# Patient Record
Sex: Female | Born: 1950
Health system: Southern US, Community
[De-identification: ages and names within clinical notes are randomized; demographics above are authoritative.]

## PROBLEM LIST (undated history)

## (undated) DIAGNOSIS — E559 Vitamin D deficiency, unspecified: Secondary | ICD-10-CM

## (undated) DIAGNOSIS — E785 Hyperlipidemia, unspecified: Secondary | ICD-10-CM

## (undated) DIAGNOSIS — K219 Gastro-esophageal reflux disease without esophagitis: Secondary | ICD-10-CM

## (undated) DIAGNOSIS — M1990 Unspecified osteoarthritis, unspecified site: Secondary | ICD-10-CM

## (undated) DIAGNOSIS — H409 Unspecified glaucoma: Secondary | ICD-10-CM

## (undated) DIAGNOSIS — I10 Essential (primary) hypertension: Secondary | ICD-10-CM

## (undated) HISTORY — DX: Hyperlipidemia, unspecified: E78.5

## (undated) HISTORY — DX: Vitamin D deficiency, unspecified: E55.9

## (undated) HISTORY — DX: Unspecified osteoarthritis, unspecified site: M19.90

## (undated) HISTORY — PX: OTHER SURGICAL HISTORY: SHX169

## (undated) HISTORY — DX: Gastro-esophageal reflux disease without esophagitis: K21.9

---

## 2013-01-26 HISTORY — PX: COLONOSCOPY: SHX174

## 2016-05-09 ENCOUNTER — Telehealth: Payer: Self-pay | Admitting: General Practice

## 2016-05-09 MED ORDER — PREDNISONE 10 MG PO TABS: ORAL_TABLET | ORAL | 0 refills | Status: DC

## 2016-05-09 MED ORDER — PREDNISONE 10 MG (21) PO TBPK: 10.0000 mg | ORAL_TABLET | Freq: Every day | ORAL | 0 refills | Status: DC

## 2016-05-09 NOTE — Telephone Encounter (Signed)
Patient aware and she would like to do the prednisone. What mg dose pack would you like me to send in?

## 2016-05-09 NOTE — Telephone Encounter (Signed)
Stop the gabapentin, can send in a prednisone dose pack for treatment of the inflammation. She can take OTC Zyrtec for the allergic portion.

## 2016-05-09 NOTE — Telephone Encounter (Signed)
rx sent to pharmacy

## 2016-05-09 NOTE — Telephone Encounter (Signed)
Per pt, after taking Gabapentin she has had a itchy rash bilateral axilla and between thighs x 2 wks.  She has tried OTC creams but, rash continues.  Please advise

## 2016-09-18 ENCOUNTER — Other Ambulatory Visit: Payer: Self-pay | Admitting: Physician Assistant

## 2016-09-22 ENCOUNTER — Ambulatory Visit (INDEPENDENT_AMBULATORY_CARE_PROVIDER_SITE_OTHER): Payer: Medicaid Other | Admitting: Physician Assistant

## 2016-09-22 ENCOUNTER — Encounter: Payer: Self-pay | Admitting: Physician Assistant

## 2016-09-22 VITALS — BP 143/73 | HR 75 | Temp 98.0°F | Ht 62.0 in | Wt 169.0 lb

## 2016-09-22 DIAGNOSIS — M05741 Rheumatoid arthritis with rheumatoid factor of right hand without organ or systems involvement: Secondary | ICD-10-CM | POA: Diagnosis not present

## 2016-09-22 DIAGNOSIS — M05742 Rheumatoid arthritis with rheumatoid factor of left hand without organ or systems involvement: Secondary | ICD-10-CM | POA: Diagnosis not present

## 2016-09-22 DIAGNOSIS — K219 Gastro-esophageal reflux disease without esophagitis: Secondary | ICD-10-CM

## 2016-09-22 DIAGNOSIS — E559 Vitamin D deficiency, unspecified: Secondary | ICD-10-CM

## 2016-09-22 DIAGNOSIS — E7800 Pure hypercholesterolemia, unspecified: Secondary | ICD-10-CM | POA: Diagnosis not present

## 2016-09-22 MED ORDER — TRAMADOL HCL 50 MG PO TABS: 50.0000 mg | ORAL_TABLET | Freq: Four times a day (QID) | ORAL | 5 refills | Status: DC | PRN

## 2016-09-22 MED ORDER — DICLOFENAC SODIUM 1 % TD GEL: 4.0000 g | Freq: Four times a day (QID) | TRANSDERMAL | 11 refills | Status: DC

## 2016-09-22 NOTE — Patient Instructions (Signed)
Arthritis Introduction Arthritis means joint pain. It can also mean joint disease. A joint is a place where bones come together. People who have arthritis may have:  Red joints.  Swollen joints.  Stiff joints.  Warm joints.  A fever.  A feeling of being sick. Follow these instructions at home: Pay attention to any changes in your symptoms. Take these actions to help with your pain and swelling. Medicines  Take over-the-counter and prescription medicines only as told by your doctor.  Do not take aspirin for pain if your doctor says that you may have gout. Activity  Rest your joint if your doctor tells you to.  Avoid activities that make the pain worse.  Exercise your joint regularly as told by your doctor. Try doing exercises like:  Swimming.  Water aerobics.  Biking.  Walking. Joint Care   If your joint is swollen, keep it raised (elevated) if told by your doctor.  If your joint feels stiff in the morning, try taking a warm shower.  If you have diabetes, do not apply heat without asking your doctor.  If told, apply heat to the joint:  Put a towel between the joint and the hot pack or heating pad.  Leave the heat on the area for 20-30 minutes.  If told, apply ice to the joint:  Put ice in a plastic bag.  Place a towel between your skin and the bag.  Leave the ice on for 20 minutes, 2-3 times per day.  Keep all follow-up visits as told by your doctor. Contact a doctor if:  The pain gets worse.  You have a fever. Get help right away if:  You have very bad pain in your joint.  You have swelling in your joint.  Your joint is red.  Many joints become painful and swollen.  You have very bad back pain.  Your leg is very weak.  You cannot control your pee (urine) or poop (stool). This information is not intended to replace advice given to you by your health care provider. Make sure you discuss any questions you have with your health care  provider. Document Released: 11/08/2009 Document Revised: 01/20/2016 Document Reviewed: 11/09/2014  2017 Elsevier  

## 2016-09-24 NOTE — Progress Notes (Signed)
BP (!) 143/73   Pulse 75   Temp 98 F (36.7 C) (Oral)   Ht 5\' 2"  (1.575 m)   Wt 169 lb (76.7 kg)   BMI 30.91 kg/m    Subjective:    Patient ID: , female    DOB: 01-May-1951, 66 y.o.   MRN: [id number]  Kristi Barnes is a 66 y.o. female presenting on 09/22/2016 for Medication Refill  This patient comes in for periodic recheck on medications and conditions. All medications are reviewed today. There are no reports of any problems with the medications. All of the medical conditions are reviewed and updated.  Lab work is reviewed and will be ordered as medically necessary. There are no new problems reported with today's visit.  Medication Refill  This is a chronic problem. The current episode started more than 1 year ago. Associated symptoms include arthralgias, joint swelling and myalgias. Pertinent negatives include no abdominal pain, chest pain, coughing, fatigue or fever. The symptoms are aggravated by walking and standing. She has tried acetaminophen, NSAIDs and rest for the symptoms.    Past Medical History:  Diagnosis Date  . Arthritis   . GERD (gastroesophageal reflux disease)   . Hyperlipidemia   . Vitamin D deficiency    Relevant past medical, surgical, family and social history reviewed and updated as indicated. Interim medical history since our last visit reviewed. Allergies and medications reviewed and updated.   Data reviewed from any sources in EPIC.  Review of Systems  Constitutional: Negative.  Negative for activity change, fatigue and fever.  HENT: Negative.   Eyes: Negative.   Respiratory: Negative.  Negative for cough.   Cardiovascular: Negative.  Negative for chest pain.  Gastrointestinal: Negative.  Negative for abdominal pain.  Endocrine: Negative.   Genitourinary: Negative.  Negative for dysuria.  Musculoskeletal: Positive for arthralgias, joint swelling and myalgias.  Skin: Negative.   Neurological: Negative.      Social History    Social History  . Marital status: Divorced    Spouse name: N/A  . Number of children: N/A  . Years of education: N/A   Occupational History  . Not on file.   Social History Main Topics  . Smoking status: Never Smoker  . Smokeless tobacco: Never Used  . Alcohol use No  . Drug use: No  . Sexual activity: Not on file   Other Topics Concern  . Not on file   Social History Narrative  . No narrative on file    History reviewed. No pertinent surgical history.  History reviewed. No pertinent family history.  Allergies as of 09/22/2016      Reactions   Asa [aspirin] Other (See Comments)   Upset stomach       Medication List       Accurate as of 09/22/16 11:59 PM. Always use your most recent med list.          diclofenac sodium 1 % Gel Commonly known as:  VOLTAREN Apply 4 g topically 4 (four) times daily.   Geritol Liqd Take by mouth.   omeprazole 20 MG capsule Commonly known as:  PRILOSEC Take 20 mg by mouth daily.   simvastatin 40 MG tablet Commonly known as:  ZOCOR Take 40 mg by mouth daily.   traMADol 50 MG tablet Commonly known as:  ULTRAM Take 1 tablet (50 mg total) by mouth every 6 (six) hours as needed. for pain   Vitamin D (Ergocalciferol) 50000 units Caps capsule Commonly known  as:  DRISDOL Take 50,000 Units by mouth every 7 (seven) days.          Objective:    BP (!) 143/73   Pulse 75   Temp 98 F (36.7 C) (Oral)   Ht 5\' 2"  (1.575 m)   Wt 169 lb (76.7 kg)   BMI 30.91 kg/m   Allergies  Allergen Reactions  . Asa [Aspirin] Other (See Comments)    Upset stomach     Wt Readings from Last 3 Encounters:  09/22/16 169 lb (76.7 kg)    Physical Exam  Constitutional: She is oriented to person, place, and time. She appears well-developed and well-nourished.  HENT:  Head: Normocephalic and atraumatic.  Eyes: Conjunctivae and EOM are normal. Pupils are equal, round, and reactive to light.  Cardiovascular: Normal rate, regular rhythm,  normal heart sounds and intact distal pulses.   Pulmonary/Chest: Effort normal and breath sounds normal.  Abdominal: Soft. Bowel sounds are normal.  Musculoskeletal: Normal range of motion. She exhibits edema and deformity.  Neurological: She is alert and oriented to person, place, and time. She has normal reflexes.  Skin: Skin is warm and dry. No rash noted.  Psychiatric: She has a normal mood and affect. Her behavior is normal. Judgment and thought content normal.  Nursing note and vitals reviewed.       Assessment & Plan:   1. Rheumatoid arthritis involving both hands with positive rheumatoid factor (HCC) - traMADol (ULTRAM) 50 MG tablet; Take 1 tablet (50 mg total) by mouth every 6 (six) hours as needed. for pain  Dispense: 120 tablet; Refill: 5 - diclofenac sodium (VOLTAREN) 1 % GEL; Apply 4 g topically 4 (four) times daily.  Dispense: 300 g; Refill: 11  2. Vitamin D deficiency - Vitamin D, Ergocalciferol, (DRISDOL) 50000 units CAPS capsule; Take 50,000 Units by mouth every 7 (seven) days.  3. Gastroesophageal reflux disease without esophagitis - omeprazole (PRILOSEC) 20 MG capsule; Take 20 mg by mouth daily.; Refill: 12  4. Pure hypercholesterolemia - simvastatin (ZOCOR) 40 MG tablet; Take 40 mg by mouth daily.; Refill: 12   Continue all other maintenance medications as listed above. Educational handout given for arthritis  Follow up plan: Return in about 6 months (around 03/22/2017).  03/24/2017 PA-C [facility] [address]  [place], [place] [phone]   09/24/2016, 10:02 PM

## 2016-10-04 ENCOUNTER — Telehealth: Payer: Self-pay | Admitting: Physician Assistant

## 2016-10-04 NOTE — Telephone Encounter (Signed)
Yes, she can try it, if it is ineffective, she can make an appointment to discuss.

## 2016-10-04 NOTE — Telephone Encounter (Signed)
Patient wants to know if estroven for hot flashes OTC

## 2016-10-04 NOTE — Telephone Encounter (Signed)
Patient aware.

## 2016-12-14 ENCOUNTER — Other Ambulatory Visit: Payer: Self-pay | Admitting: *Deleted

## 2016-12-14 DIAGNOSIS — E559 Vitamin D deficiency, unspecified: Secondary | ICD-10-CM

## 2016-12-14 MED ORDER — VITAMIN D (ERGOCALCIFEROL) 1.25 MG (50000 UNIT) PO CAPS: 50000.0000 [IU] | ORAL_CAPSULE | ORAL | 11 refills | Status: DC

## 2017-02-12 ENCOUNTER — Telehealth: Payer: Self-pay | Admitting: Physician Assistant

## 2017-02-12 ENCOUNTER — Other Ambulatory Visit: Payer: Self-pay | Admitting: Physician Assistant

## 2017-02-12 DIAGNOSIS — E7800 Pure hypercholesterolemia, unspecified: Secondary | ICD-10-CM

## 2017-02-12 NOTE — Telephone Encounter (Signed)
What is the name of the medication? simvastatin  Have you contacted your pharmacy to request a refill? yes  Which pharmacy would you like this sent to? [facility] in [place]   Patient notified that their request is being sent to the clinical staff for review and that they should receive a call once it is complete. If they do not receive a call within 24 hours they can check with their pharmacy or our office.

## 2017-02-12 NOTE — Telephone Encounter (Signed)
No labs in chart

## 2017-02-12 NOTE — Telephone Encounter (Signed)
Fill for 2 months and patient can return for labs in next two months. Annual labs: CMP, CBC, LIPID

## 2017-02-13 ENCOUNTER — Other Ambulatory Visit: Payer: Self-pay | Admitting: *Deleted

## 2017-02-13 DIAGNOSIS — K219 Gastro-esophageal reflux disease without esophagitis: Secondary | ICD-10-CM

## 2017-02-13 DIAGNOSIS — E786 Lipoprotein deficiency: Secondary | ICD-10-CM

## 2017-02-13 NOTE — Telephone Encounter (Signed)
Patient aware and lab orders placed

## 2017-02-20 ENCOUNTER — Other Ambulatory Visit: Payer: Medicaid Other

## 2017-02-20 DIAGNOSIS — E786 Lipoprotein deficiency: Secondary | ICD-10-CM

## 2017-02-20 DIAGNOSIS — K219 Gastro-esophageal reflux disease without esophagitis: Secondary | ICD-10-CM

## 2017-02-20 LAB — CMP14+EGFR
A/G RATIO: 1.7 (ref 1.2–2.2)
ALT: 14 IU/L (ref 0–32)
AST: 21 IU/L (ref 0–40)
Albumin: 4.6 g/dL (ref 3.6–4.8)
Alkaline Phosphatase: 78 IU/L (ref 39–117)
BILIRUBIN TOTAL: 0.2 mg/dL (ref 0.0–1.2)
BUN/Creatinine Ratio: 11 — ABNORMAL LOW (ref 12–28)
BUN: 11 mg/dL (ref 8–27)
CHLORIDE: 100 mmol/L (ref 96–106)
CO2: 26 mmol/L (ref 20–29)
Calcium: 9.9 mg/dL (ref 8.7–10.3)
Creatinine, Ser: 0.99 mg/dL (ref 0.57–1.00)
GFR calc non Af Amer: 60 mL/min/{1.73_m2} (ref >59)
GFR, EST AFRICAN AMERICAN: 70 mL/min/{1.73_m2} (ref >59)
Globulin, Total: 2.7 g/dL (ref 1.5–4.5)
Glucose: 76 mg/dL (ref 65–99)
POTASSIUM: 4.5 mmol/L (ref 3.5–5.2)
SODIUM: 139 mmol/L (ref 134–144)
Total Protein: 7.3 g/dL (ref 6.0–8.5)

## 2017-02-20 LAB — CBC WITH DIFFERENTIAL/PLATELET
BASOS: 0 %
Basophils Absolute: 0 10*3/uL (ref 0.0–0.2)
EOS (ABSOLUTE): 0 10*3/uL (ref 0.0–0.4)
Eos: 1 %
Hematocrit: 34.7 % (ref 34.0–46.6)
Hemoglobin: 11.9 g/dL (ref 11.1–15.9)
Immature Grans (Abs): 0 10*3/uL (ref 0.0–0.1)
Immature Granulocytes: 0 %
LYMPHS ABS: 1.5 10*3/uL (ref 0.7–3.1)
Lymphs: 42 %
MCH: 30 pg (ref 26.6–33.0)
MCHC: 34.3 g/dL (ref 31.5–35.7)
MCV: 87 fL (ref 79–97)
Monocytes Absolute: 0.3 10*3/uL (ref 0.1–0.9)
Monocytes: 8 %
NEUTROS PCT: 49 %
Neutrophils Absolute: 1.8 10*3/uL (ref 1.4–7.0)
PLATELETS: 268 10*3/uL (ref 150–379)
RBC: 3.97 x10E6/uL (ref 3.77–5.28)
RDW: 13.2 % (ref 12.3–15.4)
WBC: 3.7 10*3/uL (ref 3.4–10.8)

## 2017-02-20 LAB — LIPID PANEL
Chol/HDL Ratio: 3.4 ratio (ref 0.0–4.4)
Cholesterol, Total: 177 mg/dL (ref 100–199)
HDL: 52 mg/dL (ref >39)
LDL Calculated: 106 mg/dL — ABNORMAL HIGH (ref 0–99)
TRIGLYCERIDES: 95 mg/dL (ref 0–149)
VLDL Cholesterol Cal: 19 mg/dL (ref 5–40)

## 2017-03-23 ENCOUNTER — Ambulatory Visit (INDEPENDENT_AMBULATORY_CARE_PROVIDER_SITE_OTHER): Payer: Medicaid Other | Admitting: Physician Assistant

## 2017-03-23 ENCOUNTER — Encounter: Payer: Self-pay | Admitting: Physician Assistant

## 2017-03-23 DIAGNOSIS — M05741 Rheumatoid arthritis with rheumatoid factor of right hand without organ or systems involvement: Secondary | ICD-10-CM | POA: Diagnosis not present

## 2017-03-23 DIAGNOSIS — M05742 Rheumatoid arthritis with rheumatoid factor of left hand without organ or systems involvement: Secondary | ICD-10-CM

## 2017-03-23 DIAGNOSIS — K219 Gastro-esophageal reflux disease without esophagitis: Secondary | ICD-10-CM

## 2017-03-23 DIAGNOSIS — E7800 Pure hypercholesterolemia, unspecified: Secondary | ICD-10-CM

## 2017-03-23 MED ORDER — OMEPRAZOLE 20 MG PO CPDR: 20.0000 mg | DELAYED_RELEASE_CAPSULE | Freq: Every day | ORAL | 12 refills | Status: DC

## 2017-03-23 MED ORDER — SIMVASTATIN 40 MG PO TABS: 40.0000 mg | ORAL_TABLET | Freq: Every day | ORAL | 12 refills | Status: DC

## 2017-03-23 MED ORDER — TRAMADOL HCL 50 MG PO TABS: 50.0000 mg | ORAL_TABLET | Freq: Four times a day (QID) | ORAL | 5 refills | Status: DC | PRN

## 2017-03-23 NOTE — Patient Instructions (Signed)
In a few days you may receive a survey in the mail or online from [employer] regarding your visit with us today. Please take a moment to fill this out. Your feedback is very important to our whole office. It can help us better understand your needs as well as improve your experience and satisfaction. Thank you for taking your time to complete it. We care about you.  Ezekiel Menzer, PA-C  

## 2017-03-23 NOTE — Progress Notes (Signed)
BP (!) 144/80   Pulse 74   Temp 98.3 F (36.8 C) (Oral)   Ht _0  (1.575 m)   Wt 168 lb (76.2 kg)   BMI 30.73 kg/m    Subjective:    Patient ID: Kristi Barnes, female    DOB: Apr 02, 1951, 66 y.o.   MRN: [id number]  HPI: Kristi Barnes is a 66 y.o. female presenting on 03/23/2017 for Follow-up (6 mo); Hyperlipidemia; and Rheumatoid Arthritis  This patient comes in for periodic recheck on medications and conditions including GERD, rheumatoid arthritis, hyperlipidemia. Patient is quite sad today. She has had thoughts of death and her very large family. She had death of a niece last night. She is visibly upset today. She is having months of ongoing pain in her hands. Her morning stiffness is still quite severe at times. She is doing well with the medications that she is currently on. She does not want to take an antidepressant at this time. Depression screen [facility] 2/9 03/23/2017 09/22/2016  Decreased Interest 2 2  Down, Depressed, Hopeless 2 1  PHQ - 2 Score 4 3  Altered sleeping 1 3  Tired, decreased energy 2 2  Change in appetite 1 3  Feeling bad or failure about yourself  1 3  Trouble concentrating 2 2  Moving slowly or fidgety/restless 2 3  Suicidal thoughts 1 3  PHQ-9 Score 14 22   .   All medications are reviewed today. There are no reports of any problems with the medications. All of the medical conditions are reviewed and updated.  Lab work is reviewed and will be ordered as medically necessary. There are no new problems reported with today's visit.   Relevant past medical, surgical, family and social history reviewed and updated as indicated. Allergies and medications reviewed and updated.  Past Medical History:  Diagnosis Date  . Arthritis   . GERD (gastroesophageal reflux disease)   . Hyperlipidemia   . Vitamin D deficiency     History reviewed. No pertinent surgical history.  Review of Systems  Constitutional: Negative.  Negative for activity change, fatigue and  fever.  HENT: Negative.   Eyes: Negative.   Respiratory: Negative.  Negative for cough.   Cardiovascular: Negative.  Negative for chest pain.  Gastrointestinal: Negative.  Negative for abdominal pain.  Endocrine: Negative.   Genitourinary: Negative.  Negative for dysuria.  Musculoskeletal: Positive for arthralgias, back pain, gait problem, joint swelling and myalgias.  Skin: Negative.   Psychiatric/Behavioral: Positive for decreased concentration and dysphoric mood. Negative for self-injury, sleep disturbance and suicidal ideas. The patient is nervous/anxious.     Allergies as of 03/23/2017      Reactions   Asa [aspirin] Other (See Comments)   Upset stomach       Medication List       Accurate as of 03/23/17 12:41 PM. Always use your most recent med list.          diclofenac sodium 1 % Gel Commonly known as:  VOLTAREN Apply 4 g topically 4 (four) times daily.   Geritol Liqd Take by mouth.   omeprazole 20 MG capsule Commonly known as:  PRILOSEC Take 1 capsule (20 mg total) by mouth daily.   simvastatin 40 MG tablet Commonly known as:  ZOCOR Take 1 tablet (40 mg total) by mouth daily.   traMADol 50 MG tablet Commonly known as:  ULTRAM Take 1 tablet (50 mg total) by mouth every 6 (six) hours as needed. for pain  Vitamin D (Ergocalciferol) 50000 units Caps capsule Commonly known as:  DRISDOL Take 1 capsule (50,000 Units total) by mouth every 7 (seven) days.   vitamin E 400 UNIT capsule Generic drug:  vitamin E 400 Units.          Objective:    BP (!) 144/80   Pulse 74   Temp 98.3 F (36.8 C) (Oral)   Ht _0  (1.575 m)   Wt 168 lb (76.2 kg)   BMI 30.73 kg/m   Allergies  Allergen Reactions  . Asa [Aspirin] Other (See Comments)    Upset stomach      Physical Exam  Constitutional: She is oriented to person, place, and time. She appears well-developed and well-nourished.  HENT:  Head: Normocephalic and atraumatic.  Eyes: Pupils are equal, round,  and reactive to light. Conjunctivae and EOM are normal.  Cardiovascular: Normal rate, regular rhythm, normal heart sounds and intact distal pulses.   Pulmonary/Chest: Effort normal and breath sounds normal.  Abdominal: Soft. Bowel sounds are normal.  Musculoskeletal:       Right knee: She exhibits decreased range of motion, swelling and deformity.       Left knee: She exhibits decreased range of motion, swelling and deformity.  Significantly swollen tender nodules on the hands fingers and elbows.  Neurological: She is alert and oriented to person, place, and time. She has normal reflexes.  Skin: Skin is warm and dry. No rash noted.  Psychiatric: She has a normal mood and affect. Her behavior is normal. Judgment and thought content normal.    Results for orders placed or performed in visit on 02/20/17  CBC with Differential/Platelet  Result Value Ref Range   WBC 3.7 3.4 - 10.8 x10E3/uL   RBC 3.97 3.77 - 5.28 x10E6/uL   Hemoglobin 11.9 11.1 - 15.9 g/dL   Hematocrit 34.7 34.0 - 46.6 %   MCV 87 79 - 97 fL   MCH 30.0 26.6 - 33.0 pg   MCHC 34.3 31.5 - 35.7 g/dL   RDW 13.2 12.3 - 15.4 %   Platelets 268 150 - 379 x10E3/uL   Neutrophils 49 Not Estab. %   Lymphs 42 Not Estab. %   Monocytes 8 Not Estab. %   Eos 1 Not Estab. %   Basos 0 Not Estab. %   Neutrophils Absolute 1.8 1.4 - 7.0 x10E3/uL   Lymphocytes Absolute 1.5 0.7 - 3.1 x10E3/uL   Monocytes Absolute 0.3 0.1 - 0.9 x10E3/uL   EOS (ABSOLUTE) 0.0 0.0 - 0.4 x10E3/uL   Basophils Absolute 0.0 0.0 - 0.2 x10E3/uL   Immature Granulocytes 0 Not Estab. %   Immature Grans (Abs) 0.0 0.0 - 0.1 x10E3/uL  CMP14+EGFR  Result Value Ref Range   Glucose 76 65 - 99 mg/dL   BUN 11 8 - 27 mg/dL   Creatinine, Ser 0.99 0.57 - 1.00 mg/dL   GFR calc non Af Amer 60 >59 mL/min/1.73   GFR calc Af Amer 70 >59 mL/min/1.73   BUN/Creatinine Ratio 11 (L) 12 - 28   Sodium 139 134 - 144 mmol/L   Potassium 4.5 3.5 - 5.2 mmol/L   Chloride 100 96 - 106 mmol/L     CO2 26 20 - 29 mmol/L   Calcium 9.9 8.7 - 10.3 mg/dL   Total Protein 7.3 6.0 - 8.5 g/dL   Albumin 4.6 3.6 - 4.8 g/dL   Globulin, Total 2.7 1.5 - 4.5 g/dL   Albumin/Globulin Ratio 1.7 1.2 - 2.2   Bilirubin Total  0.2 0.0 - 1.2 mg/dL   Alkaline Phosphatase 78 39 - 117 IU/L   AST 21 0 - 40 IU/L   ALT 14 0 - 32 IU/L  Lipid panel  Result Value Ref Range   Cholesterol, Total 177 100 - 199 mg/dL   Triglycerides 95 0 - 149 mg/dL   HDL 52 >39 mg/dL   VLDL Cholesterol Cal 19 5 - 40 mg/dL   LDL Calculated 106 (H) 0 - 99 mg/dL   Chol/HDL Ratio 3.4 0.0 - 4.4 ratio      Assessment & Plan:   1. Gastroesophageal reflux disease without esophagitis - omeprazole (PRILOSEC) 20 MG capsule; Take 1 capsule (20 mg total) by mouth daily.  Dispense: 30 capsule; Refill: 12  2. Rheumatoid arthritis involving both hands with positive rheumatoid factor (HCC) - traMADol (ULTRAM) 50 MG tablet; Take 1 tablet (50 mg total) by mouth every 6 (six) hours as needed. for pain  Dispense: 120 tablet; Refill: 5  3. Pure hypercholesterolemia - simvastatin (ZOCOR) 40 MG tablet; Take 1 tablet (40 mg total) by mouth daily.  Dispense: 30 tablet; Refill: 12   Current Outpatient Prescriptions:  .  diclofenac sodium (VOLTAREN) 1 % GEL, Apply 4 g topically 4 (four) times daily., Disp: 300 g, Rfl: 11 .  Iron-Vitamins (GERITOL) LIQD, Take by mouth., Disp: , Rfl:  .  omeprazole (PRILOSEC) 20 MG capsule, Take 1 capsule (20 mg total) by mouth daily., Disp: 30 capsule, Rfl: 12 .  simvastatin (ZOCOR) 40 MG tablet, Take 1 tablet (40 mg total) by mouth daily., Disp: 30 tablet, Rfl: 12 .  traMADol (ULTRAM) 50 MG tablet, Take 1 tablet (50 mg total) by mouth every 6 (six) hours as needed. for pain, Disp: 120 tablet, Rfl: 5 .  Vitamin D, Ergocalciferol, (DRISDOL) 50000 units CAPS capsule, Take 1 capsule (50,000 Units total) by mouth every 7 (seven) days., Disp: 4 capsule, Rfl: 11 .  vitamin E (VITAMIN E) 400 UNIT capsule, 400 Units.,  Disp: , Rfl:   Continue all other maintenance medications as listed above.  Follow up plan: Return in about 6 months (around 09/23/2017) for recheck.  Educational handout given for [place] PA-C [place] [address]  [place], [place] [address] [phone]   03/23/2017, 12:41 PM

## 2017-07-30 ENCOUNTER — Encounter: Payer: Self-pay | Admitting: Physician Assistant

## 2017-07-30 ENCOUNTER — Ambulatory Visit: Payer: Medicaid Other | Admitting: Physician Assistant

## 2017-07-30 VITALS — BP 158/73 | HR 75 | Temp 98.3°F | Ht 62.0 in | Wt 171.2 lb

## 2017-07-30 DIAGNOSIS — S6992XD Unspecified injury of left wrist, hand and finger(s), subsequent encounter: Secondary | ICD-10-CM | POA: Diagnosis not present

## 2017-07-30 DIAGNOSIS — M778 Other enthesopathies, not elsewhere classified: Secondary | ICD-10-CM

## 2017-07-30 DIAGNOSIS — M779 Enthesopathy, unspecified: Principal | ICD-10-CM

## 2017-07-30 NOTE — Patient Instructions (Signed)

## 2017-07-30 NOTE — Progress Notes (Signed)
BP (!) 158/73   Pulse 75   Temp 98.3 F (36.8 C) (Oral)   Ht 5\' 2"  (1.575 m)   Wt 171 lb 3.2 oz (77.7 kg)   BMI 31.31 kg/m    Subjective:    Patient ID: , female    DOB: 1951-02-01, 66 y.o.   MRN: [id number]  HPI: Kristi Barnes is a 66 y.o. female presenting on 07/30/2017 for ER follow up [facility])    Relevant past medical, surgical, family and social history reviewed and updated as indicated. Allergies and medications reviewed and updated.  Past Medical History:  Diagnosis Date  . Arthritis   . GERD (gastroesophageal reflux disease)   . Hyperlipidemia   . Vitamin D deficiency     History reviewed. No pertinent surgical history.  Review of Systems  Constitutional: Negative.  Negative for activity change, fatigue and fever.  HENT: Negative.   Eyes: Negative.   Respiratory: Negative.  Negative for cough.   Cardiovascular: Negative.  Negative for chest pain.  Gastrointestinal: Negative.  Negative for abdominal pain.  Endocrine: Negative.   Genitourinary: Negative.  Negative for dysuria.  Musculoskeletal: Positive for arthralgias, joint swelling and myalgias.  Skin: Negative.   Neurological: Negative.     Allergies as of 07/30/2017      Reactions   Asa [aspirin] Other (See Comments)   Upset stomach       Medication List        Accurate as of 07/30/17  2:58 PM. Always use your most recent med list.          diclofenac sodium 1 % Gel Commonly known as:  VOLTAREN Apply 4 g topically 4 (four) times daily.   Geritol Liqd Take by mouth.   omeprazole 20 MG capsule Commonly known as:  PRILOSEC Take 1 capsule (20 mg total) by mouth daily.   predniSONE 20 MG tablet Commonly known as:  DELTASONE TAKE TWO (2) TABLETS BY MOUTH DAILY.   simvastatin 40 MG tablet Commonly known as:  ZOCOR Take 1 tablet (40 mg total) by mouth daily.   traMADol 50 MG tablet Commonly known as:  ULTRAM Take 1 tablet (50 mg total) by mouth every 6 (six) hours  as needed. for pain   Vitamin D (Ergocalciferol) 50000 units Caps capsule Commonly known as:  DRISDOL Take 1 capsule (50,000 Units total) by mouth every 7 (seven) days.   vitamin E 400 UNIT capsule Generic drug:  vitamin E 400 Units.          Objective:    BP (!) 158/73   Pulse 75   Temp 98.3 F (36.8 C) (Oral)   Ht 5\' 2"  (1.575 m)   Wt 171 lb 3.2 oz (77.7 kg)   BMI 31.31 kg/m   Allergies  Allergen Reactions  . Asa [Aspirin] Other (See Comments)    Upset stomach      Physical Exam  Constitutional: She is oriented to person, place, and time. She appears well-developed and well-nourished.  HENT:  Head: Normocephalic and atraumatic.  Eyes: Conjunctivae and EOM are normal. Pupils are equal, round, and reactive to light.  Cardiovascular: Normal rate, regular rhythm, normal heart sounds and intact distal pulses.  Pulmonary/Chest: Effort normal and breath sounds normal.  Abdominal: Soft. Bowel sounds are normal.  Musculoskeletal:       Left hand: She exhibits decreased range of motion, tenderness and deformity. She exhibits normal capillary refill. Normal sensation noted. Normal strength noted.  Hands: Limited ROM continue to wear splint  Neurological: She is alert and oriented to person, place, and time. She has normal reflexes.  Skin: Skin is warm and dry. No rash noted.  Psychiatric: She has a normal mood and affect. Her behavior is normal. Judgment and thought content normal.        Assessment & Plan:   1. Thumb tendonitis - predniSONE (DELTASONE) 20 MG tablet; TAKE TWO (2) TABLETS BY MOUTH DAILY.; Refill: 0 - Ambulatory referral to Hand Surgery  2. Thumb injury, left, subsequent encounter - predniSONE (DELTASONE) 20 MG tablet; TAKE TWO (2) TABLETS BY MOUTH DAILY.; Refill: 0 - Ambulatory referral to Hand Surgery    Current Outpatient Medications:  .  diclofenac sodium (VOLTAREN) 1 % GEL, Apply 4 g topically 4 (four) times daily., Disp: 300 g, Rfl:  11 .  Iron-Vitamins (GERITOL) LIQD, Take by mouth., Disp: , Rfl:  .  omeprazole (PRILOSEC) 20 MG capsule, Take 1 capsule (20 mg total) by mouth daily., Disp: 30 capsule, Rfl: 12 .  predniSONE (DELTASONE) 20 MG tablet, TAKE TWO (2) TABLETS BY MOUTH DAILY., Disp: , Rfl: 0 .  simvastatin (ZOCOR) 40 MG tablet, Take 1 tablet (40 mg total) by mouth daily., Disp: 30 tablet, Rfl: 12 .  traMADol (ULTRAM) 50 MG tablet, Take 1 tablet (50 mg total) by mouth every 6 (six) hours as needed. for pain, Disp: 120 tablet, Rfl: 5 .  Vitamin D, Ergocalciferol, (DRISDOL) 50000 units CAPS capsule, Take 1 capsule (50,000 Units total) by mouth every 7 (seven) days., Disp: 4 capsule, Rfl: 11 .  vitamin E (VITAMIN E) 400 UNIT capsule, 400 Units., Disp: , Rfl:  Continue all other maintenance medications as listed above.  Follow up plan: Return if symptoms worsen or fail to improve.  Educational handout given for survey  Remus Loffler PA-C [facility] [address]  [place], [place] [address] [phone]   07/30/2017, 2:58 PM

## 2017-08-16 ENCOUNTER — Ambulatory Visit (INDEPENDENT_AMBULATORY_CARE_PROVIDER_SITE_OTHER): Payer: Medicaid Other | Admitting: Orthopaedic Surgery

## 2017-08-16 ENCOUNTER — Encounter (INDEPENDENT_AMBULATORY_CARE_PROVIDER_SITE_OTHER): Payer: Self-pay | Admitting: Orthopaedic Surgery

## 2017-08-16 VITALS — BP 149/89 | HR 71 | Ht 62.0 in | Wt 162.0 lb

## 2017-08-16 DIAGNOSIS — M65312 Trigger thumb, left thumb: Secondary | ICD-10-CM

## 2017-08-16 MED ORDER — BUPIVACAINE HCL 0.25 % IJ SOLN
0.3300 mL | INTRAMUSCULAR | Status: AC | PRN
  Administered 2017-08-16: .33 mL

## 2017-08-16 MED ORDER — METHYLPREDNISOLONE ACETATE 40 MG/ML IJ SUSP
13.3300 mg | INTRAMUSCULAR | Status: AC | PRN
  Administered 2017-08-16: 13.33 mg

## 2017-08-16 MED ORDER — LIDOCAINE HCL 1 % IJ SOLN
0.3000 mL | INTRAMUSCULAR | Status: AC | PRN
  Administered 2017-08-16: .3 mL

## 2017-08-16 NOTE — Progress Notes (Signed)
Office Visit Note   Patient: Kristi Barnes           Date of Birth: [birth year]           MRN: [id number] Visit Date: 08/16/2017              Requested by: Remus Loffler, PA-C [address] [place], [place] [address] PCP: Remus Loffler, PA-C   Assessment & Plan: Visit Diagnoses:  1. Trigger thumb, left thumb     Plan: Left trigger thumb injection performed she tolerated the injection well.  She has persistent triggering she will call and let us know. Follow-Up Instructions: No Follow-up on file.   Orders:  No orders of the defined types were placed in this encounter.  No orders of the defined types were placed in this encounter.     Procedures: Hand/UE Inj: L thumb A1 for trigger finger on 08/16/2017 3:11 PM Details: 25 G needle Medications: 0.3 mL lidocaine 1 %; 0.33 mL bupivacaine 0.25 %; 13.33 mg methylPREDNISolone acetate 40 MG/ML      Clinical Data: No additional findings.   Subjective: Chief Complaint  Patient presents with  . Left Thumb - Pain    HPI 66 year old female was helping her sister move to another location 4 months ago and has had pain in her thumb with 2 episodes of trigger thumb the rest the time she is kept that extended and has been wearing a splint.  She is given some prednisone with some improvement she is used tramadol and Voltaren gel without resolution.  She has a diagnosis with rheumatoid factor positive rheumatoid arthritis.  Review of Systems positive for seropositive rheumatoid arthritis, GE reflux, high cholesterol.  Left trigger thumb.  Positive for cataracts otherwise negative as it pertains HPI.   Objective: Vital Signs: BP (!) 149/89   Pulse 71   Ht 5\' 2"  (1.575 m)   Wt 162 lb (73.5 kg)   BMI 29.63 kg/m   Physical Exam  Constitutional: She is oriented to person, place, and time. She appears well-developed.  HENT:  Head: Normocephalic.  Right Ear: External ear normal.  Left Ear: External ear normal.  Eyes: Pupils are  equal, round, and reactive to light.  Neck: No tracheal deviation present. No thyromegaly present.  Cardiovascular: Normal rate.  Pulmonary/Chest: Effort normal.  Abdominal: Soft.  Neurological: She is alert and oriented to person, place, and time.  Skin: Skin is warm and dry.  Psychiatric: She has a normal mood and affect. Her behavior is normal.    Ortho Exam patient present with palpation over the left thumb A1 pulley with palpable nodule.  Unable to actively flex.  Mild seen tenderness.  Dorsal compartments otherwise are normal.  Good cervical range of motion elbow reaches full extension opposite hand shows no tenderness over the A1 pulley.  Specialty Comments:  No specialty comments available.  Imaging: No results found.   PMFS History: Patient Active Problem List   Diagnosis Date Noted  . Thumb tendonitis 07/30/2017  . Gastroesophageal reflux disease without esophagitis 03/23/2017  . Rheumatoid arthritis involving both hands with positive rheumatoid factor (HCC) 03/23/2017  . Pure hypercholesterolemia 03/23/2017   Past Medical History:  Diagnosis Date  . Arthritis   . GERD (gastroesophageal reflux disease)   . Hyperlipidemia   . Vitamin D deficiency     No family history on file.  No past surgical history on file. Social History   Occupational History  . Not on file  Tobacco Use  . Smoking status: Never Smoker  . Smokeless tobacco: Never Used  Substance and Sexual Activity  . Alcohol use: No  . Drug use: No  . Sexual activity: Not on file

## 2017-08-23 ENCOUNTER — Telehealth: Payer: Self-pay | Admitting: Physician Assistant

## 2017-09-21 ENCOUNTER — Encounter: Payer: Self-pay | Admitting: Physician Assistant

## 2017-09-21 ENCOUNTER — Ambulatory Visit: Payer: Medicaid Other | Admitting: Physician Assistant

## 2017-09-21 VITALS — BP 133/73 | HR 85 | Temp 98.1°F | Ht 62.0 in | Wt 169.8 lb

## 2017-09-21 DIAGNOSIS — M05742 Rheumatoid arthritis with rheumatoid factor of left hand without organ or systems involvement: Secondary | ICD-10-CM

## 2017-09-21 DIAGNOSIS — M05741 Rheumatoid arthritis with rheumatoid factor of right hand without organ or systems involvement: Secondary | ICD-10-CM

## 2017-09-21 DIAGNOSIS — Z9181 History of falling: Secondary | ICD-10-CM | POA: Diagnosis not present

## 2017-09-21 DIAGNOSIS — K047 Periapical abscess without sinus: Secondary | ICD-10-CM | POA: Diagnosis not present

## 2017-09-21 MED ORDER — PENICILLIN V POTASSIUM 500 MG PO TABS: 500.0000 mg | ORAL_TABLET | Freq: Four times a day (QID) | ORAL | 1 refills | Status: DC

## 2017-09-21 MED ORDER — TRAMADOL HCL 50 MG PO TABS: 50.0000 mg | ORAL_TABLET | Freq: Four times a day (QID) | ORAL | 5 refills | Status: DC | PRN

## 2017-09-21 NOTE — Patient Instructions (Signed)
In a few days you may receive a survey in the mail or online from [employer] regarding your visit with us today. Please take a moment to fill this out. Your feedback is very important to our whole office. It can help us better understand your needs as well as improve your experience and satisfaction. Thank you for taking your time to complete it. We care about you.  Jaymere Alen, PA-C  

## 2017-09-21 NOTE — Progress Notes (Signed)
Falls    BP 133/73   Pulse 85   Temp 98.1 F (36.7 C) (Oral)   Ht 5\' 2"  (1.575 m)   Wt 169 lb 12.8 oz (77 kg)   BMI 31.06 kg/m    Subjective:    Patient ID: , female    DOB: 1951-05-30, 67 y.o.   MRN: [id number]  HPI: Kristi Barnes is a 67 y.o. female presenting on 09/21/2017 for Follow-up (6 month )  Patient comes in for an abscessed tooth.  She cannot get her dentist for another she has never had a problem with this before.  It is very painful in the upper rim of her teeth.  It hurts to press down on it. Patient also reports that she has had a few more falls.  She does have known rheumatoid arthritis.  Denies any major injury.  But we wanted to just take note that she has had a fall .  Relevant past medical, surgical, family and social history reviewed and updated as indicated. Allergies and medications reviewed and updated.  Past Medical History:  Diagnosis Date  . Arthritis   . GERD (gastroesophageal reflux disease)   . Hyperlipidemia   . Vitamin D deficiency     History reviewed. No pertinent surgical history.  Review of Systems  Constitutional: Negative.   HENT: Positive for dental problem and mouth sores.   Eyes: Negative.   Respiratory: Negative.   Gastrointestinal: Negative.   Genitourinary: Negative.   Musculoskeletal: Positive for arthralgias, joint swelling and myalgias.    Allergies as of 09/21/2017      Reactions   Asa [aspirin] Other (See Comments)   Upset stomach       Medication List        Accurate as of 09/21/17 11:59 PM. Always use your most recent med list.          diclofenac sodium 1 % Gel Commonly known as:  VOLTAREN Apply 4 g topically 4 (four) times daily.   Geritol Liqd Take by mouth.   omeprazole 20 MG capsule Commonly known as:  PRILOSEC Take 1 capsule (20 mg total) by mouth daily.   penicillin v potassium 500 MG tablet Commonly known as:  VEETID Take 1 tablet (500 mg total) by mouth 4 (four) times daily.     simvastatin 40 MG tablet Commonly known as:  ZOCOR Take 1 tablet (40 mg total) by mouth daily.   traMADol 50 MG tablet Commonly known as:  ULTRAM Take 1 tablet (50 mg total) by mouth every 6 (six) hours as needed. for pain   Vitamin D (Ergocalciferol) 50000 units Caps capsule Commonly known as:  DRISDOL Take 1 capsule (50,000 Units total) by mouth every 7 (seven) days.   vitamin E 400 UNIT capsule Generic drug:  vitamin E 400 Units.          Objective:    BP 133/73   Pulse 85   Temp 98.1 F (36.7 C) (Oral)   Ht 5\' 2"  (1.575 m)   Wt 169 lb 12.8 oz (77 kg)   BMI 31.06 kg/m   Allergies  Allergen Reactions  . Asa [Aspirin] Other (See Comments)    Upset stomach      Physical Exam  Constitutional: She is oriented to person, place, and time. She appears well-developed and well-nourished.  HENT:  Head: Normocephalic and atraumatic.  Mouth/Throat: Abnormal dentition. Dental abscesses present.    Eyes: Conjunctivae and EOM are normal. Pupils are equal, round, and  reactive to light.  Cardiovascular: Normal rate, regular rhythm, normal heart sounds and intact distal pulses.  Pulmonary/Chest: Effort normal and breath sounds normal.  Abdominal: Soft. Bowel sounds are normal.  Neurological: She is alert and oriented to person, place, and time. She has normal reflexes.  Skin: Skin is warm and dry. No rash noted.  Psychiatric: She has a normal mood and affect. Her behavior is normal. Judgment and thought content normal.        Assessment & Plan:   1. Rheumatoid arthritis involving both hands with positive rheumatoid factor (HCC) - traMADol (ULTRAM) 50 MG tablet; Take 1 tablet (50 mg total) by mouth every 6 (six) hours as needed. for pain  Dispense: 120 tablet; Refill: 5  2. Dental abscess - penicillin v potassium (VEETID) 500 MG tablet; Take 1 tablet (500 mg total) by mouth 4 (four) times daily.  Dispense: 40 tablet; Refill: 1  3. History of falling    Current  Outpatient Medications:  .  diclofenac sodium (VOLTAREN) 1 % GEL, Apply 4 g topically 4 (four) times daily., Disp: 300 g, Rfl: 11 .  Iron-Vitamins (GERITOL) LIQD, Take by mouth., Disp: , Rfl:  .  omeprazole (PRILOSEC) 20 MG capsule, Take 1 capsule (20 mg total) by mouth daily., Disp: 30 capsule, Rfl: 12 .  penicillin v potassium (VEETID) 500 MG tablet, Take 1 tablet (500 mg total) by mouth 4 (four) times daily., Disp: 40 tablet, Rfl: 1 .  simvastatin (ZOCOR) 40 MG tablet, Take 1 tablet (40 mg total) by mouth daily., Disp: 30 tablet, Rfl: 12 .  traMADol (ULTRAM) 50 MG tablet, Take 1 tablet (50 mg total) by mouth every 6 (six) hours as needed. for pain, Disp: 120 tablet, Rfl: 5 .  Vitamin D, Ergocalciferol, (DRISDOL) 50000 units CAPS capsule, Take 1 capsule (50,000 Units total) by mouth every 7 (seven) days., Disp: 4 capsule, Rfl: 11 .  vitamin E (VITAMIN E) 400 UNIT capsule, 400 Units., Disp: , Rfl:  Continue all other maintenance medications as listed above.  Follow up plan: Return in about 6 months (around 03/21/2018) for recheck.  Educational handout given for survey  Remus Loffler PA-C [facility] [address]  [place], [place] [address] [phone]   09/24/2017, 9:21 AM

## 2017-12-05 ENCOUNTER — Other Ambulatory Visit: Payer: Self-pay | Admitting: Physician Assistant

## 2017-12-05 DIAGNOSIS — E559 Vitamin D deficiency, unspecified: Secondary | ICD-10-CM

## 2017-12-10 ENCOUNTER — Encounter: Payer: Self-pay | Admitting: *Deleted

## 2018-01-07 DIAGNOSIS — Z1231 Encounter for screening mammogram for malignant neoplasm of breast: Secondary | ICD-10-CM | POA: Diagnosis not present

## 2018-02-19 ENCOUNTER — Encounter: Payer: Self-pay | Admitting: *Deleted

## 2018-03-21 ENCOUNTER — Encounter: Payer: Self-pay | Admitting: Physician Assistant

## 2018-03-21 ENCOUNTER — Ambulatory Visit (INDEPENDENT_AMBULATORY_CARE_PROVIDER_SITE_OTHER): Payer: Medicare Other | Admitting: Physician Assistant

## 2018-03-21 VITALS — BP 141/70 | HR 80 | Temp 97.4°F | Ht 62.0 in | Wt 171.8 lb

## 2018-03-21 DIAGNOSIS — M05741 Rheumatoid arthritis with rheumatoid factor of right hand without organ or systems involvement: Secondary | ICD-10-CM | POA: Diagnosis not present

## 2018-03-21 DIAGNOSIS — M779 Enthesopathy, unspecified: Secondary | ICD-10-CM | POA: Diagnosis not present

## 2018-03-21 DIAGNOSIS — K219 Gastro-esophageal reflux disease without esophagitis: Secondary | ICD-10-CM

## 2018-03-21 DIAGNOSIS — E7800 Pure hypercholesterolemia, unspecified: Secondary | ICD-10-CM

## 2018-03-21 DIAGNOSIS — R635 Abnormal weight gain: Secondary | ICD-10-CM

## 2018-03-21 DIAGNOSIS — Z0000 Encounter for general adult medical examination without abnormal findings: Secondary | ICD-10-CM

## 2018-03-21 DIAGNOSIS — M05742 Rheumatoid arthritis with rheumatoid factor of left hand without organ or systems involvement: Secondary | ICD-10-CM

## 2018-03-21 DIAGNOSIS — M778 Other enthesopathies, not elsewhere classified: Secondary | ICD-10-CM

## 2018-03-21 MED ORDER — DICLOFENAC SODIUM 1 % TD GEL: 4.0000 g | Freq: Four times a day (QID) | TRANSDERMAL | 11 refills | Status: DC

## 2018-03-21 MED ORDER — TRAMADOL HCL 50 MG PO TABS: 50.0000 mg | ORAL_TABLET | Freq: Four times a day (QID) | ORAL | 5 refills | Status: DC | PRN

## 2018-03-21 NOTE — Progress Notes (Signed)
BP (!) 141/70   Pulse 80   Temp (!) 97.4 F (36.3 C) (Oral)   Ht '5\' 2"'  (1.575 m)   Wt 171 lb 12.8 oz (77.9 kg)   BMI 31.42 kg/m    Subjective:    Patient ID: Kristi Barnes, female    DOB: 1951/03/22, 67 y.o.   MRN: [id number]  HPI: Kristi Barnes is a 67 y.o. female presenting on 03/21/2018 for Abdominal Pain and Hyperlipidemia  Patient comes in for 49-monthrecheck on her chronic joint pain.  She does have rheumatoid arthritis and multiple joint pain.  She has had some benefit of topical diclofenac.  She states she needs refills on this.  She also is taking her Ultram as needed for the severe pain episodes.  She is due labs soon.  Order will be placed for her to come back in a fasting state.  She also had an episode where she has a very sharp pain down her belly and across the lower abdomen it was very short-lived and it only happened one time.  It happened last month.  She denies any more episodes like that.  She denies any fever, chills, nausea vomiting or diarrhea.  Past Medical History:  Diagnosis Date  . Arthritis   . GERD (gastroesophageal reflux disease)   . Hyperlipidemia   . Vitamin D deficiency    Relevant past medical, surgical, family and social history reviewed and updated as indicated. Interim medical history since our last visit reviewed. Allergies and medications reviewed and updated. DATA REVIEWED: CHART IN EPIC  Family History reviewed for pertinent findings.  Review of Systems  Constitutional: Positive for fatigue. Negative for activity change and fever.  HENT: Negative.   Eyes: Negative.   Respiratory: Negative.  Negative for cough.   Cardiovascular: Negative.  Negative for chest pain.  Gastrointestinal: Negative.  Negative for abdominal pain.  Endocrine: Negative.   Genitourinary: Negative.  Negative for dysuria.  Musculoskeletal: Positive for arthralgias, gait problem, joint swelling and myalgias.  Skin: Negative.   Hematological: Negative.      Allergies as of 03/21/2018      Reactions   Asa [aspirin] Other (See Comments)   Upset stomach       Medication List        Accurate as of 03/21/18  2:13 PM. Always use your most recent med list.          diclofenac sodium 1 % Gel Commonly known as:  VOLTAREN Apply 4 g topically 4 (four) times daily.   Geritol Liqd Take by mouth.   latanoprost 0.005 % ophthalmic solution Commonly known as:  XALATAN INSTILL ONE DROP IN EACH EYE AT BEDTIME   omeprazole 20 MG capsule Commonly known as:  PRILOSEC Take 1 capsule (20 mg total) by mouth daily.   simvastatin 40 MG tablet Commonly known as:  ZOCOR Take 1 tablet (40 mg total) by mouth daily.   traMADol 50 MG tablet Commonly known as:  ULTRAM Take 1 tablet (50 mg total) by mouth every 6 (six) hours as needed. for pain   Vitamin D (Ergocalciferol) 50000 units Caps capsule Commonly known as:  DRISDOL TAKE 1 CAPSULE BY MOUTH EVERY 7 DAYS   vitamin E 400 UNIT capsule Generic drug:  vitamin E 400 Units.          Objective:    BP (!) 141/70   Pulse 80   Temp (!) 97.4 F (36.3 C) (Oral)   Ht '5\' 2"'  (  1.575 m)   Wt 171 lb 12.8 oz (77.9 kg)   BMI 31.42 kg/m   Allergies  Allergen Reactions  . Asa [Aspirin] Other (See Comments)    Upset stomach      Wt Readings from Last 3 Encounters:  03/21/18 171 lb 12.8 oz (77.9 kg)  09/21/17 169 lb 12.8 oz (77 kg)  08/16/17 162 lb (73.5 kg)    Physical Exam  Constitutional: She is oriented to person, place, and time. She appears well-developed and well-nourished.  HENT:  Head: Normocephalic and atraumatic.  Eyes: Pupils are equal, round, and reactive to light. Conjunctivae and EOM are normal.  Cardiovascular: Normal rate, regular rhythm, normal heart sounds and intact distal pulses.  Pulmonary/Chest: Effort normal and breath sounds normal.  Abdominal: Soft. Bowel sounds are normal.  Neurological: She is alert and oriented to person, place, and time. She has normal  reflexes.  Skin: Skin is warm and dry. No rash noted.  Psychiatric: She has a normal mood and affect. Her behavior is normal. Judgment and thought content normal.        Assessment & Plan:   1. Rheumatoid arthritis involving both hands with positive rheumatoid factor ([place]) - Ambulatory referral to Rheumatology - diclofenac sodium (VOLTAREN) 1 % GEL; Apply 4 g topically 4 (four) times daily.  Dispense: 300 g; Refill: 11 - traMADol (ULTRAM) 50 MG tablet; Take 1 tablet (50 mg total) by mouth every 6 (six) hours as needed. for pain  Dispense: 120 tablet; Refill: 5  2. Thumb tendonitis - Ambulatory referral to Rheumatology  3. Gastroesophageal reflux disease without esophagitis  4. Well adult exam - CBC with Differential/Platelet; Future - CMP14+EGFR; Future - Lipid panel; Future  5. Weight gain - CBC with Differential/Platelet; Future - CMP14+EGFR; Future - Lipid panel; Future  6. Pure hypercholesterolemia - Lipid panel; Future   Continue all other maintenance medications as listed above.  Follow up plan: Return in about 6 months (around 09/21/2018) for recheck.  Educational handout given for [place] PA-C [facility] [address]  [place],  [address] [phone]   03/21/2018, 2:13 PM

## 2018-03-22 ENCOUNTER — Ambulatory Visit: Payer: Medicaid Other | Admitting: Physician Assistant

## 2018-03-25 ENCOUNTER — Other Ambulatory Visit: Payer: Self-pay | Admitting: Physician Assistant

## 2018-03-25 DIAGNOSIS — E7800 Pure hypercholesterolemia, unspecified: Secondary | ICD-10-CM

## 2018-04-01 ENCOUNTER — Other Ambulatory Visit: Payer: Self-pay | Admitting: Physician Assistant

## 2018-04-01 DIAGNOSIS — K219 Gastro-esophageal reflux disease without esophagitis: Secondary | ICD-10-CM

## 2018-04-03 DIAGNOSIS — M19042 Primary osteoarthritis, left hand: Secondary | ICD-10-CM | POA: Diagnosis not present

## 2018-04-03 DIAGNOSIS — M25569 Pain in unspecified knee: Secondary | ICD-10-CM | POA: Diagnosis not present

## 2018-04-03 DIAGNOSIS — M19041 Primary osteoarthritis, right hand: Secondary | ICD-10-CM | POA: Diagnosis not present

## 2018-04-03 DIAGNOSIS — M47816 Spondylosis without myelopathy or radiculopathy, lumbar region: Secondary | ICD-10-CM | POA: Diagnosis not present

## 2018-04-03 DIAGNOSIS — M1990 Unspecified osteoarthritis, unspecified site: Secondary | ICD-10-CM | POA: Diagnosis not present

## 2018-04-03 DIAGNOSIS — M25562 Pain in left knee: Secondary | ICD-10-CM | POA: Diagnosis not present

## 2018-04-03 DIAGNOSIS — M069 Rheumatoid arthritis, unspecified: Secondary | ICD-10-CM | POA: Diagnosis not present

## 2018-04-03 DIAGNOSIS — M79641 Pain in right hand: Secondary | ICD-10-CM | POA: Diagnosis not present

## 2018-04-03 DIAGNOSIS — M79642 Pain in left hand: Secondary | ICD-10-CM | POA: Diagnosis not present

## 2018-04-03 DIAGNOSIS — M064 Inflammatory polyarthropathy: Secondary | ICD-10-CM | POA: Diagnosis not present

## 2018-04-03 DIAGNOSIS — M542 Cervicalgia: Secondary | ICD-10-CM | POA: Diagnosis not present

## 2018-04-03 DIAGNOSIS — M549 Dorsalgia, unspecified: Secondary | ICD-10-CM | POA: Diagnosis not present

## 2018-04-03 DIAGNOSIS — M25561 Pain in right knee: Secondary | ICD-10-CM | POA: Diagnosis not present

## 2018-04-03 DIAGNOSIS — M79643 Pain in unspecified hand: Secondary | ICD-10-CM | POA: Diagnosis not present

## 2018-04-03 DIAGNOSIS — M47812 Spondylosis without myelopathy or radiculopathy, cervical region: Secondary | ICD-10-CM | POA: Diagnosis not present

## 2018-04-04 ENCOUNTER — Other Ambulatory Visit: Payer: Self-pay

## 2018-04-04 ENCOUNTER — Other Ambulatory Visit: Payer: Self-pay | Admitting: Physician Assistant

## 2018-04-04 DIAGNOSIS — Z1239 Encounter for other screening for malignant neoplasm of breast: Secondary | ICD-10-CM

## 2018-04-23 DIAGNOSIS — M1990 Unspecified osteoarthritis, unspecified site: Secondary | ICD-10-CM | POA: Diagnosis not present

## 2018-04-23 DIAGNOSIS — M25569 Pain in unspecified knee: Secondary | ICD-10-CM | POA: Diagnosis not present

## 2018-04-23 DIAGNOSIS — M79643 Pain in unspecified hand: Secondary | ICD-10-CM | POA: Diagnosis not present

## 2018-04-23 DIAGNOSIS — M542 Cervicalgia: Secondary | ICD-10-CM | POA: Diagnosis not present

## 2018-04-23 DIAGNOSIS — M064 Inflammatory polyarthropathy: Secondary | ICD-10-CM | POA: Diagnosis not present

## 2018-04-23 DIAGNOSIS — M549 Dorsalgia, unspecified: Secondary | ICD-10-CM | POA: Diagnosis not present

## 2018-04-23 DIAGNOSIS — Z8739 Personal history of other diseases of the musculoskeletal system and connective tissue: Secondary | ICD-10-CM | POA: Diagnosis not present

## 2018-06-21 NOTE — Congregational Nurse Program (Unsigned)
  Dept: [phone]   Congregational Nurse Program Note  Date of Encounter: 06/19/2018  Past Medical History: Past Medical History:  Diagnosis Date  . Arthritis   . GERD (gastroesophageal reflux disease)   . Hyperlipidemia   . Vitamin D deficiency     Encounter Details: CNP Questionnaire - 06/19/18 1000      Questionnaire   Patient Status  Not Applicable    Race  Black or African American    Location Patient Served At  [employer], [employer]    Insurance  Medicare;Private Insurance    Uninsured  Not Applicable    Food  Yes, have food insecurities    Housing/Utilities  Yes, have permanent housing    Transportation  No transportation needs    Interpersonal Safety  Yes, feel physically and emotionally safe where you currently live    Medication  No medication insecurities    Medical Provider  Yes    Referrals  Other    ED Visit Averted  Not Applicable    Life-Saving Intervention Made  Not Applicable      Request for BP check. On medication for hypertension and has a PCP.  Client shared currently under a lot strss related to family crisis. Encouragement anad spiritually support given. Hewitt Shorts RN. [facility]  [phone].

## 2018-07-30 ENCOUNTER — Telehealth: Payer: Self-pay

## 2018-07-30 NOTE — Telephone Encounter (Signed)
Insurance denied prior auth Diclofenac 1% gel

## 2018-08-17 ENCOUNTER — Other Ambulatory Visit: Payer: Self-pay | Admitting: Physician Assistant

## 2018-08-17 DIAGNOSIS — E7800 Pure hypercholesterolemia, unspecified: Secondary | ICD-10-CM

## 2018-10-14 ENCOUNTER — Other Ambulatory Visit: Payer: Self-pay | Admitting: Physician Assistant

## 2018-10-14 DIAGNOSIS — E7800 Pure hypercholesterolemia, unspecified: Secondary | ICD-10-CM

## 2018-10-22 ENCOUNTER — Other Ambulatory Visit: Payer: Self-pay | Admitting: Physician Assistant

## 2018-10-22 DIAGNOSIS — M05741 Rheumatoid arthritis with rheumatoid factor of right hand without organ or systems involvement: Secondary | ICD-10-CM

## 2018-10-22 DIAGNOSIS — M05742 Rheumatoid arthritis with rheumatoid factor of left hand without organ or systems involvement: Principal | ICD-10-CM

## 2018-11-05 ENCOUNTER — Encounter: Payer: Self-pay | Admitting: Physician Assistant

## 2018-11-05 ENCOUNTER — Ambulatory Visit (INDEPENDENT_AMBULATORY_CARE_PROVIDER_SITE_OTHER): Payer: Medicare Other | Admitting: Physician Assistant

## 2018-11-05 VITALS — BP 144/69 | HR 75 | Temp 98.1°F | Ht 62.0 in | Wt 166.2 lb

## 2018-11-05 DIAGNOSIS — E559 Vitamin D deficiency, unspecified: Secondary | ICD-10-CM | POA: Diagnosis not present

## 2018-11-05 DIAGNOSIS — E7800 Pure hypercholesterolemia, unspecified: Secondary | ICD-10-CM

## 2018-11-05 DIAGNOSIS — M170 Bilateral primary osteoarthritis of knee: Secondary | ICD-10-CM

## 2018-11-05 DIAGNOSIS — Z86010 Personal history of colonic polyps: Secondary | ICD-10-CM | POA: Diagnosis not present

## 2018-11-05 MED ORDER — VITAMIN D (ERGOCALCIFEROL) 1.25 MG (50000 UNIT) PO CAPS: ORAL_CAPSULE | ORAL | 3 refills | Status: DC

## 2018-11-05 MED ORDER — SIMVASTATIN 40 MG PO TABS: 40.0000 mg | ORAL_TABLET | Freq: Every day | ORAL | 11 refills | Status: DC

## 2018-11-05 MED ORDER — TRAMADOL HCL 50 MG PO TABS: 50.0000 mg | ORAL_TABLET | Freq: Four times a day (QID) | ORAL | 3 refills | Status: DC | PRN

## 2018-11-08 ENCOUNTER — Encounter: Payer: Medicare Other | Admitting: Physician Assistant

## 2018-11-10 DIAGNOSIS — M170 Bilateral primary osteoarthritis of knee: Secondary | ICD-10-CM | POA: Insufficient documentation

## 2018-11-10 DIAGNOSIS — Z860100 Personal history of colon polyps, unspecified: Secondary | ICD-10-CM | POA: Insufficient documentation

## 2018-11-10 DIAGNOSIS — E559 Vitamin D deficiency, unspecified: Secondary | ICD-10-CM | POA: Insufficient documentation

## 2018-11-10 NOTE — Progress Notes (Signed)
BP (!) 144/69   Pulse 75   Temp 98.1 F (36.7 C) (Oral)   Ht 5\' 2"  (1.575 m)   Wt 166 lb 3.2 oz (75.4 kg)   BMI 30.40 kg/m    Subjective:    Patient ID: Kristi Barnes, female    DOB: [birth year], 68 y.o.   MRN: [id number]  HPI: Kristi Barnes is a 68 y.o. female presenting on 11/05/2018 for Medical Management of Chronic Issues (6 month follow up ) and Medication Refill  Patient comes in for periodic recheck.  Due to some problem.  She cannot take any NSAIDs.  She does have 1 continue this and degenerative joints throughout her body.  She currently is on 20 MME very basis.  She is actually on total medications 5 times in the past year.  There are large gaps in her use..    She did have labs performed in July.  She is not explained any other new symptoms.  When talking about her health maintenance needs, she does need to have a gastroenterology appointment.  Because many years ago she had 5 polyps found on colonoscopy.  Her gastroenterologist has left the area.  Past Medical History:  Diagnosis Date  . Arthritis   . GERD (gastroesophageal reflux disease)   . Hyperlipidemia   . Vitamin D deficiency    Relevant past medical, surgical, family and social history reviewed and updated as indicated. Interim medical history since our last visit reviewed. Allergies and medications reviewed and updated. DATA REVIEWED: CHART IN EPIC  Family History reviewed for pertinent findings.  Review of Systems  Constitutional: Negative.   HENT: Negative.   Eyes: Negative.   Respiratory: Negative.   Gastrointestinal: Negative.   Genitourinary: Negative.   Musculoskeletal: Positive for arthralgias and joint swelling.    Allergies as of 11/05/2018      Reactions   Asa [aspirin] Other (See Comments)   Upset stomach       Medication List       Accurate as of November 05, 2018 11:59 PM. Always use your most recent med list.        diclofenac sodium 1 % Gel Commonly known as:  VOLTAREN  Apply 4 g topically 4 (four) times daily.   Geritol Liqd Take by mouth.   latanoprost 0.005 % ophthalmic solution Commonly known as:  XALATAN INSTILL ONE DROP IN EACH EYE AT BEDTIME   omeprazole 20 MG capsule Commonly known as:  PRILOSEC TAKE ONE CAPSULE BY MOUTH DAILY.   simvastatin 40 MG tablet Commonly known as:  ZOCOR Take 1 tablet (40 mg total) by mouth daily.   traMADol 50 MG tablet Commonly known as:  ULTRAM Take 1 tablet (50 mg total) by mouth every 6 (six) hours as needed. for pain   Vitamin D (Ergocalciferol) 1.25 MG (50000 UT) Caps capsule Commonly known as:  DRISDOL TAKE 1 CAPSULE BY MOUTH EVERY 7 DAYS   vitamin E 400 UNIT capsule Generic drug:  vitamin E 400 Units.          Objective:    BP (!) 144/69   Pulse 75   Temp 98.1 F (36.7 C) (Oral)   Ht 5\' 2"  (1.575 m)   Wt 166 lb 3.2 oz (75.4 kg)   BMI 30.40 kg/m   Allergies  Allergen Reactions  . Asa [Aspirin] Other (See Comments)    Upset stomach      Wt Readings from Last 3 Encounters:  11/05/18 166 lb  3.2 oz (75.4 kg)  03/21/18 171 lb 12.8 oz (77.9 kg)  09/21/17 169 lb 12.8 oz (77 kg)    Physical Exam Constitutional:      Appearance: She is well-developed.  HENT:     Head: Normocephalic and atraumatic.  Eyes:     Conjunctiva/sclera: Conjunctivae normal.     Pupils: Pupils are equal, round, and reactive to light.  Cardiovascular:     Rate and Rhythm: Normal rate and regular rhythm.     Heart sounds: Normal heart sounds.  Pulmonary:     Effort: Pulmonary effort is normal.     Breath sounds: Normal breath sounds.  Abdominal:     General: Bowel sounds are normal.     Palpations: Abdomen is soft.  Skin:    General: Skin is warm and dry.     Findings: No rash.  Neurological:     Mental Status: She is alert and oriented to person, place, and time.     Deep Tendon Reflexes: Reflexes are normal and symmetric.  Psychiatric:        Behavior: Behavior normal.        Thought Content:  Thought content normal.        Judgment: Judgment normal.     Lab order has been placed     Assessment & Plan:   1. Primary osteoarthritis of both knees - traMADol (ULTRAM) 50 MG tablet; Take 1 tablet (50 mg total) by mouth every 6 (six) hours as needed. for pain  Dispense: 120 tablet; Refill: 3  2. Vitamin D deficiency - Vitamin D, Ergocalciferol, (DRISDOL) 1.25 MG (50000 UT) CAPS capsule; TAKE 1 CAPSULE BY MOUTH EVERY 7 DAYS  Dispense: 12 capsule; Refill: 3  3. Pure hypercholesterolemia - simvastatin (ZOCOR) 40 MG tablet; Take 1 tablet (40 mg total) by mouth daily.  Dispense: 30 tablet; Refill: 11  4. History of colon polyps - Ambulatory referral to Gastroenterology   Continue all other maintenance medications as listed above.  Follow up plan: Return in about 6 months (around 05/08/2019) for CPE and labs.  Educational handout given for survey  Remus Loffler PA-C [facility] [address]  [place], [place] [address] [phone]   11/10/2018, 11:28 PM   ,

## 2018-11-13 ENCOUNTER — Encounter: Payer: Self-pay | Admitting: Gastroenterology

## 2018-11-26 ENCOUNTER — Other Ambulatory Visit: Payer: Self-pay | Admitting: *Deleted

## 2018-11-26 DIAGNOSIS — E7800 Pure hypercholesterolemia, unspecified: Secondary | ICD-10-CM

## 2018-11-26 MED ORDER — SIMVASTATIN 40 MG PO TABS
40.0000 mg | ORAL_TABLET | Freq: Every day | ORAL | 11 refills | Status: DC
Start: 2018-11-26 — End: 2019-02-06

## 2019-01-21 ENCOUNTER — Ambulatory Visit: Payer: Self-pay

## 2019-01-27 ENCOUNTER — Encounter: Payer: Self-pay | Admitting: Gastroenterology

## 2019-02-03 DIAGNOSIS — Z1231 Encounter for screening mammogram for malignant neoplasm of breast: Secondary | ICD-10-CM | POA: Diagnosis not present

## 2019-02-03 LAB — HM MAMMOGRAPHY

## 2019-02-05 ENCOUNTER — Telehealth: Payer: Self-pay | Admitting: Physician Assistant

## 2019-02-05 NOTE — Telephone Encounter (Signed)
Pt was told to start taking Vitamin E 40 mg she has been everywhere and she can only find 180 MG and needs to talk to [facility] about it

## 2019-02-05 NOTE — Telephone Encounter (Signed)
Sorry, mis read that, the 180 is fine

## 2019-02-05 NOTE — Telephone Encounter (Signed)
A prescription for vitamin D was sent in March to her pharmacy.  It is for the weekly version.

## 2019-02-05 NOTE — Telephone Encounter (Signed)
Patient is referring to vitamin e

## 2019-02-06 ENCOUNTER — Other Ambulatory Visit: Payer: Self-pay | Admitting: *Deleted

## 2019-02-06 DIAGNOSIS — E7800 Pure hypercholesterolemia, unspecified: Secondary | ICD-10-CM

## 2019-02-06 MED ORDER — SIMVASTATIN 40 MG PO TABS: 40.0000 mg | ORAL_TABLET | Freq: Every day | ORAL | 2 refills | Status: DC

## 2019-02-06 NOTE — Telephone Encounter (Signed)
Pt aware.

## 2019-02-12 ENCOUNTER — Ambulatory Visit: Payer: Self-pay

## 2019-02-12 DIAGNOSIS — N6489 Other specified disorders of breast: Secondary | ICD-10-CM | POA: Diagnosis not present

## 2019-02-12 DIAGNOSIS — R928 Other abnormal and inconclusive findings on diagnostic imaging of breast: Secondary | ICD-10-CM | POA: Diagnosis not present

## 2019-02-12 LAB — HM MAMMOGRAPHY

## 2019-04-10 ENCOUNTER — Telehealth: Payer: Self-pay | Admitting: *Deleted

## 2019-04-10 DIAGNOSIS — E7800 Pure hypercholesterolemia, unspecified: Secondary | ICD-10-CM

## 2019-04-10 MED ORDER — SIMVASTATIN 40 MG PO TABS: 40.0000 mg | ORAL_TABLET | Freq: Every day | ORAL | 0 refills | Status: DC

## 2019-04-22 ENCOUNTER — Other Ambulatory Visit: Payer: Self-pay | Admitting: Physician Assistant

## 2019-04-22 DIAGNOSIS — K219 Gastro-esophageal reflux disease without esophagitis: Secondary | ICD-10-CM

## 2019-05-01 ENCOUNTER — Other Ambulatory Visit: Payer: Self-pay

## 2019-05-01 ENCOUNTER — Ambulatory Visit (INDEPENDENT_AMBULATORY_CARE_PROVIDER_SITE_OTHER): Payer: Medicare Other | Admitting: *Deleted

## 2019-05-01 DIAGNOSIS — Z86010 Personal history of colonic polyps: Secondary | ICD-10-CM

## 2019-05-01 MED ORDER — NA SULFATE-K SULFATE-MG SULF 17.5-3.13-1.6 GM/177ML PO SOLN: 1.0000 | Freq: Once | ORAL | 0 refills | Status: AC

## 2019-05-01 NOTE — Patient Instructions (Signed)
Kristi Barnes  02/25/1951 MRN: [id number]     Procedure Date: 07/18/2019 Time to register: 9:15 am Place to register: [facility] Procedure Time: 10:15 am Scheduled provider: Dr. Oneida Alar    PREPARATION FOR COLONOSCOPY WITH SUPREP BOWEL PREP KIT  Note: Suprep Bowel Prep Kit is a split-dose (2day) regimen. Consumption of BOTH 6-ounce bottles is required for a complete prep.  Please notify us immediately if you are diabetic, take iron supplements, or if you are on Coumadin or any other blood thinners.                                                                                                                                                1 DAY BEFORE PROCEDURE: 07/17/2019 DATE:   DAY: Thursday Continue clear liquids the entire day - NO SOLID FOOD.   At 6:00pm: Complete steps 1 through 4 below, using ONE (1) 6-ounce bottle, before going to bed. Step 1:  Pour ONE (1) 6-ounce bottle of SUPREP liquid into the mixing container.  Step 2:  Add cool drinking water to the 16 ounce line on the container and mix.  Note: Dilute the solution concentrate as directed prior to use. Step 3:  DRINK ALL the liquid in the container. Step 4:  You MUST drink an additional two (2) or more 16 ounce containers of water over the next one (1) hour.   Continue clear liquids.  DAY OF PROCEDURE:   DATE: 07/18/2019  DAY: Friday If you take medications for your heart, blood pressure, or breathing, you may take these medications.    5 hours before your procedure at :  5:15 am Step 1:  Pour ONE (1) 6-ounce bottle of SUPREP liquid into the mixing container.  Step 2:  Add cool drinking water to the 16 ounce line on the container and mix.  Note: Dilute the solution concentrate as directed prior to use. Step 3:  DRINK ALL the liquid in the container. Step 4:  You MUST drink an additional two (2) or more 16 ounce containers of water over the next one (1) hour. You MUST complete the final glass of water at  least 3 hours before your colonoscopy. Nothing by mouth past 7:15 am  You may take your morning medications with sip of water unless we have instructed otherwise.    Please see below for Dietary Information.  CLEAR LIQUIDS INCLUDE:  Water Jello (NOT red in color)   Ice Popsicles (NOT red in color)   Tea (sugar ok, no milk/cream) Powdered fruit flavored drinks  Coffee (sugar ok, no milk/cream) Gatorade/ Lemonade/ Kool-Aid  (NOT red in color)   Juice: apple, white grape, white cranberry Soft drinks  Clear bullion, consomme, broth (fat free beef/chicken/vegetable)  Carbonated beverages (any kind)  Strained chicken noodle soup Hard Candy   Remember: Clear liquids are liquids that  will allow you to see your fingers on the other side of a clear glass. Be sure liquids are NOT red in color, and not cloudy, but CLEAR.  DO NOT EAT OR DRINK ANY OF THE FOLLOWING:  Dairy products of any kind   Cranberry juice Tomato juice / V8 juice   Grapefruit juice Orange juice     Red grape juice  Do not eat any solid foods, including such foods as: cereal, oatmeal, yogurt, fruits, vegetables, creamed soups, eggs, bread, crackers, pureed foods in a blender, etc.   HELPFUL HINTS FOR DRINKING PREP SOLUTION:   Make sure prep is extremely cold. Mix and refrigerate the the morning of the prep. You may also put in the freezer.   You may try mixing some Crystal Light or Country Time Lemonade if you prefer. Mix in small amounts; add more if necessary.  Try drinking through a straw  Rinse mouth with water or a mouthwash between glasses, to remove after-taste.  Try sipping on a cold beverage /ice/ popsicles between glasses of prep.  Place a piece of sugar-free hard candy in mouth between glasses.  If you become nauseated, try consuming smaller amounts, or stretch out the time between glasses. Stop for 30-60 minutes, then slowly start back drinking.     OTHER INSTRUCTIONS  You will need a responsible adult  at least 66 years of age to accompany you and drive you home. This person must remain in the waiting room during your procedure. The hospital will cancel your procedure if you do not have a responsible adult with you.   1. Wear loose fitting clothing that is easily removed. 2. Leave jewelry and other valuables at home.  3. Remove all body piercing jewelry and leave at home. 4. Total time from sign-in until discharge is approximately 2-3 hours. 5. You should go home directly after your procedure and rest. You can resume normal activities the day after your procedure. 6. The day of your procedure you should not:  Drive  Make legal decisions  Operate machinery  Drink alcohol  Return to work   You may call the office (Dept: [phone]) before 5:00pm, or page the doctor on call [phone]) after 5:00pm, for further instructions, if necessary.   Insurance Information YOU WILL NEED TO CHECK WITH YOUR INSURANCE COMPANY FOR THE BENEFITS OF COVERAGE YOU HAVE FOR THIS PROCEDURE.  UNFORTUNATELY, NOT ALL INSURANCE COMPANIES HAVE BENEFITS TO COVER ALL OR PART OF THESE TYPES OF PROCEDURES.  IT IS YOUR RESPONSIBILITY TO CHECK YOUR BENEFITS, HOWEVER, WE WILL BE GLAD TO ASSIST YOU WITH ANY CODES YOUR INSURANCE COMPANY MAY NEED.    PLEASE NOTE THAT MOST INSURANCE COMPANIES WILL NOT COVER A SCREENING COLONOSCOPY FOR PEOPLE UNDER THE AGE OF 50  IF YOU HAVE BCBS INSURANCE, YOU MAY HAVE BENEFITS FOR A SCREENING COLONOSCOPY BUT IF POLYPS ARE FOUND THE DIAGNOSIS WILL CHANGE AND THEN YOU MAY HAVE A DEDUCTIBLE THAT WILL NEED TO BE MET. SO PLEASE MAKE SURE YOU CHECK YOUR BENEFITS FOR A SCREENING COLONOSCOPY AS WELL AS A DIAGNOSTIC COLONOSCOPY.

## 2019-05-01 NOTE — Progress Notes (Signed)
Gastroenterology Pre-Procedure Review  Request Date: 05/01/2019 Requesting Physician: Particia Nearing, PA @ [facility], Last TCS 10 years ago done in [place], 5 polyps per pt   PATIENT REVIEW QUESTIONS: The patient responded to the following health history questions as indicated:    1. Diabetes Melitis: No 2. Joint replacements in the past 12 months: No 3. Major health problems in the past 3 months: No 4. Has an artificial valve or MVP: No 5. Has a defibrillator: No 6. Has been advised in past to take antibiotics in advance of a procedure like teeth cleaning: No 7. Family history of colon cancer: No 8. Alcohol Use: No 9. History of sleep apnea: No 10. History of coronary artery or other vascular stents placed within the last 12 months: No 11. History of any prior anesthesia complications: No    MEDICATIONS & ALLERGIES:    Patient reports the following regarding taking any blood thinners:   Plavix? No Aspirin? No Coumadin? No Brilinta? No Xarelto? No Eliquis? No Pradaxa? No Savaysa? No Effient? No  Patient confirms/reports the following medications:  Current Outpatient Medications  Medication Sig Dispense Refill  . diclofenac sodium (VOLTAREN) 1 % GEL Apply 4 g topically 4 (four) times daily. 300 g 11  . Iron-Vitamins (GERITOL) LIQD Take by mouth as needed.     . latanoprost (XALATAN) 0.005 % ophthalmic solution INSTILL ONE DROP IN EACH EYE AT BEDTIME  99  . omeprazole (PRILOSEC) 20 MG capsule TAKE ONE CAPSULE BY MOUTH DAILY. 30 capsule 0  . simvastatin (ZOCOR) 40 MG tablet Take 1 tablet (40 mg total) by mouth daily. 90 tablet 0  . traMADol (ULTRAM) 50 MG tablet Take 1 tablet (50 mg total) by mouth every 6 (six) hours as needed. for pain (Patient taking differently: Take 50 mg by mouth every 3 (three) hours as needed. for pain) 120 tablet 3  . Vitamin D, Ergocalciferol, (DRISDOL) 1.25 MG (50000 UT) CAPS capsule TAKE 1 CAPSULE BY MOUTH EVERY 7 DAYS 12 capsule 3  . vitamin E (VITAMIN E) 400  UNIT capsule 400 Units daily.      No current facility-administered medications for this visit.     Patient confirms/reports the following allergies:  Allergies  Allergen Reactions  . Asa [Aspirin] Other (See Comments)    Upset stomach      No orders of the defined types were placed in this encounter.   AUTHORIZATION INFORMATION Primary Insurance: Medicare Part B,  ID #: [id number] Pre-Cert / Auth required: No, not required  Secondary Insurance: Medicaid [place],  ID #: [id number] Pre-Cert / Josem Kaufmann required: No, not required  SCHEDULE INFORMATION: Procedure has been scheduled as follows:  Date: 07/18/2019, Time: 10:15 Location: [facility] with Dr. Oneida Alar  This Gastroenterology Pre-Precedure Review Form is being routed to the following provider(s): Walden Field, NP

## 2019-05-02 ENCOUNTER — Other Ambulatory Visit: Payer: Self-pay | Admitting: Physician Assistant

## 2019-05-02 DIAGNOSIS — M170 Bilateral primary osteoarthritis of knee: Secondary | ICD-10-CM

## 2019-05-02 NOTE — Progress Notes (Signed)
Will need OV due to medications.

## 2019-05-06 ENCOUNTER — Encounter: Payer: Self-pay | Admitting: *Deleted

## 2019-05-06 NOTE — Progress Notes (Signed)
PATIENT SCHEDULED  °

## 2019-05-06 NOTE — Progress Notes (Signed)
Mailed letter with appointment information and procedure cancellation.   

## 2019-05-09 ENCOUNTER — Other Ambulatory Visit: Payer: Self-pay

## 2019-05-09 ENCOUNTER — Ambulatory Visit: Payer: Medicare Other | Admitting: Physician Assistant

## 2019-05-12 ENCOUNTER — Ambulatory Visit (INDEPENDENT_AMBULATORY_CARE_PROVIDER_SITE_OTHER): Payer: Medicare Other | Admitting: Physician Assistant

## 2019-05-12 ENCOUNTER — Encounter: Payer: Self-pay | Admitting: Physician Assistant

## 2019-05-12 ENCOUNTER — Other Ambulatory Visit: Payer: Self-pay

## 2019-05-12 VITALS — BP 154/80 | HR 90 | Temp 97.3°F | Ht 62.0 in | Wt 173.8 lb

## 2019-05-12 DIAGNOSIS — Z79891 Long term (current) use of opiate analgesic: Secondary | ICD-10-CM | POA: Diagnosis not present

## 2019-05-12 DIAGNOSIS — Z0000 Encounter for general adult medical examination without abnormal findings: Secondary | ICD-10-CM | POA: Diagnosis not present

## 2019-05-12 DIAGNOSIS — G8929 Other chronic pain: Secondary | ICD-10-CM

## 2019-05-12 DIAGNOSIS — M170 Bilateral primary osteoarthritis of knee: Secondary | ICD-10-CM | POA: Diagnosis not present

## 2019-05-12 DIAGNOSIS — E7800 Pure hypercholesterolemia, unspecified: Secondary | ICD-10-CM | POA: Diagnosis not present

## 2019-05-12 DIAGNOSIS — M545 Low back pain: Secondary | ICD-10-CM

## 2019-05-12 DIAGNOSIS — K219 Gastro-esophageal reflux disease without esophagitis: Secondary | ICD-10-CM | POA: Diagnosis not present

## 2019-05-12 MED ORDER — TRAMADOL HCL 50 MG PO TABS: 50.0000 mg | ORAL_TABLET | Freq: Four times a day (QID) | ORAL | 3 refills | Status: DC | PRN

## 2019-05-12 MED ORDER — DICLOFENAC SODIUM 1 % TD GEL: 4.0000 g | Freq: Four times a day (QID) | TRANSDERMAL | 11 refills | Status: DC

## 2019-05-12 MED ORDER — OMEPRAZOLE 20 MG PO CPDR: 20.0000 mg | DELAYED_RELEASE_CAPSULE | Freq: Every day | ORAL | 3 refills | Status: DC

## 2019-05-12 MED ORDER — SIMVASTATIN 40 MG PO TABS: 40.0000 mg | ORAL_TABLET | Freq: Every day | ORAL | 0 refills | Status: DC

## 2019-05-12 NOTE — Patient Instructions (Signed)
Arthritis Arthritis means joint pain. It can also mean joint disease. A joint is a place where bones come together. There are more than 100 types of arthritis. What are the causes? This condition may be caused by:  Wear and tear of a joint. This is the most common cause.  A lot of acid in the blood, which leads to pain in the joint (gout).  Pain and swelling (inflammation) in a joint.  Infection of a joint.  Injuries in the joint.  A reaction to medicines (allergy). In some cases, the cause may not be known. What are the signs or symptoms? Symptoms of this condition include:  Redness at a joint.  Swelling at a joint.  Stiffness at a joint.  Warmth coming from the joint.  A fever.  A feeling of being sick. How is this treated? This condition may be treated with:  Treating the cause, if it is known.  Rest.  Raising (elevating) the joint.  Putting cold or hot packs on the joint.  Medicines to treat symptoms and reduce pain and swelling.  Shots of medicines (cortisone) into the joint. You may also be told to make changes in your life, such as doing exercises and losing weight. Follow these instructions at home: Medicines  Take over-the-counter and prescription medicines only as told by your doctor.  Do not take aspirin for pain if your doctor says that you may have gout. Activity  Rest your joint if your doctor tells you to.  Avoid activities that make the pain worse.  Exercise your joint regularly as told by your doctor. Try doing exercises like: ? Swimming. ? Water aerobics. ? Biking. ? Walking. Managing pain, stiffness, and swelling      If told, put ice on the affected area. ? Put ice in a plastic bag. ? Place a towel between your skin and the bag. ? Leave the ice on for 20 minutes, 2-3 times per day.  If your joint is swollen, raise (elevate) it above the level of your heart if told by your doctor.  If your joint feels stiff in the morning,  try taking a warm shower.  If told, put heat on the affected area. Do this as often as told by your doctor. Use the heat source that your doctor recommends, such as a moist heat pack or a heating pad. If you have diabetes, do not apply heat without asking your doctor. To apply heat: ? Place a towel between your skin and the heat source. ? Leave the heat on for 20-30 minutes. ? Remove the heat if your skin turns bright red. This is very important if you are unable to feel pain, heat, or cold. You may have a greater risk of getting burned. General instructions  Do not use any products that contain nicotine or tobacco, such as cigarettes, e-cigarettes, and chewing tobacco. If you need help quitting, ask your doctor.  Keep all follow-up visits as told by your doctor. This is important. Contact a doctor if:  The pain gets worse.  You have a fever. Get help right away if:  You have very bad pain in your joint.  You have swelling in your joint.  Your joint is red.  Many joints become painful and swollen.  You have very bad back pain.  Your leg is very weak.  You cannot control your pee (urine) or poop (stool). Summary  Arthritis means joint pain. It can also mean joint disease. A joint is a place   where bones come together.  The most common cause of this condition is wear and tear of a joint.  Symptoms of this condition include redness, swelling, or stiffness of the joint.  This condition is treated with rest, raising the joint, medicines, and putting cold or hot packs on the joint.  Follow your doctor's instructions about medicines, activity, exercises, and other home care treatments. This information is not intended to replace advice given to you by your health care provider. Make sure you discuss any questions you have with your health care provider. Document Released: 11/08/2009 Document Revised: 07/22/2018 Document Reviewed: 07/22/2018 Elsevier Patient Education  2020  Elsevier Inc.  

## 2019-05-12 NOTE — Progress Notes (Signed)
BP (!) 154/80   Pulse 90   Temp (!) 97.3 F (36.3 C) (Temporal)   Ht 5' 2" (1.575 m)   Wt 173 lb 12.8 oz (78.8 kg)   SpO2 98%   BMI 31.79 kg/m    Subjective:    Patient ID: Kristi Barnes, female    DOB: 02/14/1951, 68 y.o.   MRN: [id number]  HPI: Kristi Barnes is a 68 y.o. female presenting on 05/12/2019 for Back Pain   PAIN ASSESSMENT: Cause of pain- OA of hands, knees, hips, back  This patient returns for a 3 month recheck on narcotic use for the above named conditions  Current medications-tramadol 50 mg 1 up to 4 times a day as needed prescription is refilled today Diclofenac gel 1% apply 4 times a day to affected joints Patient is unable to take oral because of her GERD  Medication side effects- none Any concerns- no  Pain on scale of 1-10- 7 Frequency- daily What increases pain- walking What makes pain Better- rest Effects on ADL - mild Any change in general medical condition- no  Effectiveness of current meds- good Adverse reactions form pain meds-no PMP AWARE website reviewed: Yes Any suspicious activity on PMP Aware: No MME daily dose: 20, filled every other month  Contract on file 11/08/18 Last UDS  05/12/19  [facility] script sent or current  History of overdose or risk of abuse none   Past Medical History:  Diagnosis Date  . Arthritis   . GERD (gastroesophageal reflux disease)   . Hyperlipidemia   . Vitamin D deficiency    Relevant past medical, surgical, family and social history reviewed and updated as indicated. Interim medical history since our last visit reviewed. Allergies and medications reviewed and updated. DATA REVIEWED: CHART IN EPIC  Family History reviewed for pertinent findings.  Review of Systems  Constitutional: Negative.   HENT: Negative.   Eyes: Negative.   Respiratory: Negative.   Gastrointestinal: Negative.   Genitourinary: Negative.   Musculoskeletal: Positive for arthralgias, joint swelling, myalgias and neck pain.     Allergies as of 05/12/2019      Reactions   Asa [aspirin] Other (See Comments)   Upset stomach       Medication List       Accurate as of May 12, 2019 11:59 PM. If you have any questions, ask your nurse or doctor.        diclofenac sodium 1 % Gel Commonly known as: VOLTAREN Apply 4 g topically 4 (four) times daily.   Geritol Liqd Take by mouth as needed.   latanoprost 0.005 % ophthalmic solution Commonly known as: XALATAN INSTILL ONE DROP IN EACH EYE AT BEDTIME   omeprazole 20 MG capsule Commonly known as: PRILOSEC Take 1 capsule (20 mg total) by mouth daily.   simvastatin 40 MG tablet Commonly known as: ZOCOR Take 1 tablet (40 mg total) by mouth daily.   Suprep Bowel Prep Kit 17.5-3.13-1.6 GM/177ML Soln Generic drug: Na Sulfate-K Sulfate-Mg Sulf MIX AND DRINK ONE KIT AS DIRECTED FOR ONE DOSE.   traMADol 50 MG tablet Commonly known as: ULTRAM Take 1 tablet (50 mg total) by mouth every 6 (six) hours as needed. for pain What changed: when to take this   Vitamin D (Ergocalciferol) 1.25 MG (50000 UT) Caps capsule Commonly known as: DRISDOL TAKE 1 CAPSULE BY MOUTH EVERY 7 DAYS   vitamin E 400 UNIT capsule Generic drug: vitamin E 400 Units daily.  Objective:    BP (!) 154/80   Pulse 90   Temp (!) 97.3 F (36.3 C) (Temporal)   Ht 5' 2" (1.575 m)   Wt 173 lb 12.8 oz (78.8 kg)   SpO2 98%   BMI 31.79 kg/m   Allergies  Allergen Reactions  . Asa [Aspirin] Other (See Comments)    Upset stomach      Wt Readings from Last 3 Encounters:  05/12/19 173 lb 12.8 oz (78.8 kg)  11/05/18 166 lb 3.2 oz (75.4 kg)  03/21/18 171 lb 12.8 oz (77.9 kg)    Physical Exam Constitutional:      General: She is not in acute distress.    Appearance: Normal appearance. She is well-developed.  HENT:     Head: Normocephalic and atraumatic.  Cardiovascular:     Rate and Rhythm: Normal rate.  Pulmonary:     Effort: Pulmonary effort is normal.  Skin:     General: Skin is warm and dry.     Findings: No rash.  Neurological:     Mental Status: She is alert and oriented to person, place, and time.     Deep Tendon Reflexes: Reflexes are normal and symmetric.     Results for orders placed or performed in visit on 05/12/19  CBC with Differential/Platelet  Result Value Ref Range   WBC 6.1 3.4 - 10.8 x10E3/uL   RBC 4.00 3.77 - 5.28 x10E6/uL   Hemoglobin 11.5 11.1 - 15.9 g/dL   Hematocrit 35.4 34.0 - 46.6 %   MCV 89 79 - 97 fL   MCH 28.8 26.6 - 33.0 pg   MCHC 32.5 31.5 - 35.7 g/dL   RDW 12.5 11.7 - 15.4 %   Platelets 266 150 - 450 x10E3/uL   Neutrophils 41 Not Estab. %   Lymphs 50 Not Estab. %   Monocytes 8 Not Estab. %   Eos 1 Not Estab. %   Basos 0 Not Estab. %   Neutrophils Absolute 2.5 1.4 - 7.0 x10E3/uL   Lymphocytes Absolute 3.1 0.7 - 3.1 x10E3/uL   Monocytes Absolute 0.5 0.1 - 0.9 x10E3/uL   EOS (ABSOLUTE) 0.0 0.0 - 0.4 x10E3/uL   Basophils Absolute 0.0 0.0 - 0.2 x10E3/uL   Immature Granulocytes 0 Not Estab. %   Immature Grans (Abs) 0.0 0.0 - 0.1 x10E3/uL  CMP14+EGFR  Result Value Ref Range   Glucose 75 65 - 99 mg/dL   BUN 11 8 - 27 mg/dL   Creatinine, Ser 0.86 0.57 - 1.00 mg/dL   GFR calc non Af Amer 71 >59 mL/min/1.73   GFR calc Af Amer 81 >59 mL/min/1.73   BUN/Creatinine Ratio 13 12 - 28   Sodium 141 134 - 144 mmol/L   Potassium 4.3 3.5 - 5.2 mmol/L   Chloride 101 96 - 106 mmol/L   CO2 25 20 - 29 mmol/L   Calcium 9.8 8.7 - 10.3 mg/dL   Total Protein 7.4 6.0 - 8.5 g/dL   Albumin 4.7 3.8 - 4.8 g/dL   Globulin, Total 2.7 1.5 - 4.5 g/dL   Albumin/Globulin Ratio 1.7 1.2 - 2.2   Bilirubin Total <0.2 0.0 - 1.2 mg/dL   Alkaline Phosphatase 76 39 - 117 IU/L   AST 20 0 - 40 IU/L   ALT 14 0 - 32 IU/L  TSH  Result Value Ref Range   TSH 2.110 0.450 - 4.500 uIU/mL  ToxASSURE Select 13 (MW), Urine  Result Value Ref Range   Summary Note   Lipid panel  Result Value Ref Range   Cholesterol, Total 178 100 - 199 mg/dL    Triglycerides 114 0 - 149 mg/dL   HDL 55 >39 mg/dL   VLDL Cholesterol Cal 20 5 - 40 mg/dL   LDL Chol Calc (NIH) 103 (H) 0 - 99 mg/dL   Chol/HDL Ratio 3.2 0.0 - 4.4 ratio      Assessment & Plan:   1. Chronic midline low back pain without sciatica - traMADol (ULTRAM) 50 MG tablet; Take 1 tablet (50 mg total) by mouth every 6 (six) hours as needed. for pain  Dispense: 120 tablet; Refill: 3  2. Primary osteoarthritis of both knees - ToxASSURE Select 13 (MW), Urine - traMADol (ULTRAM) 50 MG tablet; Take 1 tablet (50 mg total) by mouth every 6 (six) hours as needed. for pain  Dispense: 120 tablet; Refill: 3 - diclofenac sodium (VOLTAREN) 1 % GEL; Apply 4 g topically 4 (four) times daily.  Dispense: 300 g; Refill: 11  3. Pure hypercholesterolemia - CBC with Differential/Platelet - CMP14+EGFR - TSH - ToxASSURE Select 13 (MW), Urine - Lipid panel - simvastatin (ZOCOR) 40 MG tablet; Take 1 tablet (40 mg total) by mouth daily.  Dispense: 90 tablet; Refill: 0  4. Gastroesophageal reflux disease without esophagitis - omeprazole (PRILOSEC) 20 MG capsule; Take 1 capsule (20 mg total) by mouth daily.  Dispense: 90 capsule; Refill: 3  5. Well adult exam - CBC with Differential/Platelet - CMP14+EGFR - TSH - ToxASSURE Select 13 (MW), Urine - Lipid panel   Continue all other maintenance medications as listed above.  Follow up plan: Return in about 6 months (around 11/09/2019).  Educational handout given for [place] PA-C [facility] [address]  [place], [place] [address] [phone]   05/15/2019, 4:41 PM

## 2019-05-13 LAB — CBC WITH DIFFERENTIAL/PLATELET
Basophils Absolute: 0 10*3/uL (ref 0.0–0.2)
Basos: 0 %
EOS (ABSOLUTE): 0 10*3/uL (ref 0.0–0.4)
Eos: 1 %
Hematocrit: 35.4 % (ref 34.0–46.6)
Hemoglobin: 11.5 g/dL (ref 11.1–15.9)
Immature Grans (Abs): 0 10*3/uL (ref 0.0–0.1)
Immature Granulocytes: 0 %
Lymphocytes Absolute: 3.1 10*3/uL (ref 0.7–3.1)
Lymphs: 50 %
MCH: 28.8 pg (ref 26.6–33.0)
MCHC: 32.5 g/dL (ref 31.5–35.7)
MCV: 89 fL (ref 79–97)
Monocytes Absolute: 0.5 10*3/uL (ref 0.1–0.9)
Monocytes: 8 %
Neutrophils Absolute: 2.5 10*3/uL (ref 1.4–7.0)
Neutrophils: 41 %
Platelets: 266 10*3/uL (ref 150–450)
RBC: 4 x10E6/uL (ref 3.77–5.28)
RDW: 12.5 % (ref 11.7–15.4)
WBC: 6.1 10*3/uL (ref 3.4–10.8)

## 2019-05-13 LAB — CMP14+EGFR
ALT: 14 IU/L (ref 0–32)
AST: 20 IU/L (ref 0–40)
Albumin/Globulin Ratio: 1.7 (ref 1.2–2.2)
Albumin: 4.7 g/dL (ref 3.8–4.8)
Alkaline Phosphatase: 76 IU/L (ref 39–117)
BUN/Creatinine Ratio: 13 (ref 12–28)
BUN: 11 mg/dL (ref 8–27)
Bilirubin Total: <0.2 mg/dL (ref 0.0–1.2)
CO2: 25 mmol/L (ref 20–29)
Calcium: 9.8 mg/dL (ref 8.7–10.3)
Chloride: 101 mmol/L (ref 96–106)
Creatinine, Ser: 0.86 mg/dL (ref 0.57–1.00)
GFR calc Af Amer: 81 mL/min/{1.73_m2} (ref >59)
GFR calc non Af Amer: 71 mL/min/{1.73_m2} (ref >59)
Globulin, Total: 2.7 g/dL (ref 1.5–4.5)
Glucose: 75 mg/dL (ref 65–99)
Potassium: 4.3 mmol/L (ref 3.5–5.2)
Sodium: 141 mmol/L (ref 134–144)
Total Protein: 7.4 g/dL (ref 6.0–8.5)

## 2019-05-13 LAB — LIPID PANEL
Chol/HDL Ratio: 3.2 ratio (ref 0.0–4.4)
Cholesterol, Total: 178 mg/dL (ref 100–199)
HDL: 55 mg/dL (ref >39)
LDL Chol Calc (NIH): 103 mg/dL — ABNORMAL HIGH (ref 0–99)
Triglycerides: 114 mg/dL (ref 0–149)
VLDL Cholesterol Cal: 20 mg/dL (ref 5–40)

## 2019-05-13 LAB — TSH: TSH: 2.11 u[IU]/mL (ref 0.450–4.500)

## 2019-05-14 LAB — TOXASSURE SELECT 13 (MW), URINE

## 2019-05-15 ENCOUNTER — Telehealth: Payer: Self-pay | Admitting: Physician Assistant

## 2019-05-15 NOTE — Telephone Encounter (Signed)
I do not know what she is talking about.  Does she have information?

## 2019-05-15 NOTE — Telephone Encounter (Signed)
Patient unsure name of kit. Patient received a call for Medicare Wellness visit and was informed that they will send her the test kit, patient will perform test on self through swab

## 2019-05-26 ENCOUNTER — Telehealth: Payer: Self-pay | Admitting: Physician Assistant

## 2019-05-26 NOTE — Telephone Encounter (Signed)
Have you seen this form? Please advise and send back to pools.

## 2019-05-26 NOTE — Telephone Encounter (Signed)
Found paperwork on Kristi Barnes's desk - coverage person will be working on it.  Left message for patient that we will let her know when the paperwork is finished

## 2019-05-26 NOTE — Telephone Encounter (Signed)
No, I have not seen it.

## 2019-05-27 ENCOUNTER — Encounter: Payer: Self-pay | Admitting: *Deleted

## 2019-05-27 ENCOUNTER — Ambulatory Visit (INDEPENDENT_AMBULATORY_CARE_PROVIDER_SITE_OTHER): Payer: Medicare Other | Admitting: Gastroenterology

## 2019-05-27 ENCOUNTER — Other Ambulatory Visit: Payer: Self-pay | Admitting: *Deleted

## 2019-05-27 ENCOUNTER — Encounter: Payer: Self-pay | Admitting: Gastroenterology

## 2019-05-27 ENCOUNTER — Other Ambulatory Visit: Payer: Self-pay

## 2019-05-27 VITALS — BP 151/78 | HR 68 | Temp 97.4°F | Ht 62.0 in | Wt 170.8 lb

## 2019-05-27 DIAGNOSIS — Z860100 Personal history of colon polyps, unspecified: Secondary | ICD-10-CM

## 2019-05-27 NOTE — Assessment & Plan Note (Signed)
Very pleasant 68 year old female presenting to schedule surveillance colonoscopy for history of colon polyps.  Due to chronic pain medication and prior conscious sedation requirements, will plan for sedation with MAC.  I have discussed the risks, alternatives, benefits with regards to but not limited to the risk of reaction to medication, bleeding, infection, perforation and the patient is agreeable to proceed. Written consent to be obtained.

## 2019-05-27 NOTE — Progress Notes (Addendum)
Primary Care Physician:  Terald Sleeper, PA-C  Primary Gastroenterologist:  Barney Drain, MD REVIEWED-NO ADDITIONAL RECOMMENDATIONS.   Chief Complaint  Patient presents with  . Consult    tcs. last done 01/2013    HPI:  Kristi Barnes is a 67 y.o. female here to schedule colonoscopy.  Her last one was in 2014 by Dr. Annitta Jersey.  Sessile polyp removed, recommended 5 to 10-year follow-up colonoscopy.  Recommended MAC due to polypharmacy and conscious sedation requirements in the past.  Doing well.  She has occasional constipation which she controls with a high-fiber diet. No melena, brbpr. No abdominal pain. On omeprazole for 3 years for dyspepsia, doing well. No heartburn, dysphagia, vomiting.   Tramadol for arthritis. Uses every day.   She has 90 brothers and sisters (same parents).  Father deceased in his 45s with lung cancer.  Sister has had leukemia.  A brother and a sister both had cancer, "in the back of the neck area".  She does not know the details.  No known colon cancer.    Current Outpatient Medications  Medication Sig Dispense Refill  . diclofenac sodium (VOLTAREN) 1 % GEL Apply 4 g topically 4 (four) times daily. 300 g 11  . Iron-Vitamins (GERITOL) LIQD Take by mouth as needed.     . latanoprost (XALATAN) 0.005 % ophthalmic solution INSTILL ONE DROP IN EACH EYE AT BEDTIME  99  . omeprazole (PRILOSEC) 20 MG capsule Take 1 capsule (20 mg total) by mouth daily. 90 capsule 3  . simvastatin (ZOCOR) 40 MG tablet Take 1 tablet (40 mg total) by mouth daily. 90 tablet 0  . traMADol (ULTRAM) 50 MG tablet Take 1 tablet (50 mg total) by mouth every 6 (six) hours as needed. for pain 120 tablet 3  . Vitamin D, Ergocalciferol, (DRISDOL) 1.25 MG (50000 UT) CAPS capsule TAKE 1 CAPSULE BY MOUTH EVERY 7 DAYS 12 capsule 3  . vitamin E (VITAMIN E) 400 UNIT capsule 400 Units daily.     Manus Gunning BOWEL PREP KIT 17.5-3.13-1.6 GM/177ML SOLN MIX AND DRINK ONE KIT AS DIRECTED FOR ONE DOSE.     No  current facility-administered medications for this visit.     Allergies as of 05/27/2019 - Review Complete 05/27/2019  Allergen Reaction Noted  . Asa [aspirin] Other (See Comments) 09/22/2016    Past Medical History:  Diagnosis Date  . Arthritis   . GERD (gastroesophageal reflux disease)   . Hyperlipidemia   . Vitamin D deficiency     Past Surgical History:  Procedure Laterality Date  . bunions    . COLONOSCOPY  01/2013   Dr. Sherrlyn Hock: Fentanyl 150 mcg/Versed 6 mg, sessile polyp removed from the cecum, tortuous rectosigmoid colon.  Pathology revealed focal hyperplastic change, lymphoid aggregate of second fragment with colonic tissue.  Advised to have another colonoscopy in 5 to 10 years.    Family History  Problem Relation Age of Onset  . Heart attack Mother   . Cancer Father        lung  . Leukemia Sister   . Cancer Brother        back of neck  . Cancer Sister        back of neck  . Colon cancer Neg Hx     Social History   Socioeconomic History  . Marital status: Divorced    Spouse name: Not on file  . Number of children: Not on file  . Years of education: Not on file  .  Highest education level: Not on file  Occupational History  . Not on file  Social Needs  . Financial resource strain: Not on file  . Food insecurity    Worry: Not on file    Inability: Not on file  . Transportation needs    Medical: Not on file    Non-medical: Not on file  Tobacco Use  . Smoking status: Never Smoker  . Smokeless tobacco: Never Used  Substance and Sexual Activity  . Alcohol use: No  . Drug use: No  . Sexual activity: Not on file  Lifestyle  . Physical activity    Days per week: Not on file    Minutes per session: Not on file  . Stress: Not on file  Relationships  . Social Herbalist on phone: Not on file    Gets together: Not on file    Attends religious service: Not on file    Active member of club or organization: Not on file    Attends  meetings of clubs or organizations: Not on file    Relationship status: Not on file  . Intimate partner violence    Fear of current or ex partner: Not on file    Emotionally abused: Not on file    Physically abused: Not on file    Forced sexual activity: Not on file  Other Topics Concern  . Not on file  Social History Narrative  . Not on file      ROS:  General: Negative for anorexia, weight loss, fever, chills, fatigue, weakness. Eyes: Negative for vision changes.  ENT: Negative for hoarseness, difficulty swallowing , nasal congestion. CV: Negative for chest pain, angina, palpitations, dyspnea on exertion, peripheral edema.  Respiratory: Negative for dyspnea at rest, dyspnea on exertion, cough, sputum, wheezing.  GI: See history of present illness. GU:  Negative for dysuria, hematuria, urinary incontinence, urinary frequency, nocturnal urination.  MS: Positive for joint pain, low back pain.  Derm: Negative for rash or itching.  Neuro: Negative for weakness, abnormal sensation, seizure, frequent headaches, memory loss, confusion.  Psych: Negative for anxiety, depression, suicidal ideation, hallucinations.  Endo: Negative for unusual weight change.  Heme: Negative for bruising or bleeding. Allergy: Negative for rash or hives.    Physical Examination:  BP (!) 151/78   Pulse 68   Temp (!) 97.4 F (36.3 C) (Oral)   Ht 5' 2" (1.575 m)   Wt 170 lb 12.8 oz (77.5 kg)   BMI 31.24 kg/m    General: Well-nourished, well-developed in no acute distress.  Head: Normocephalic, atraumatic.   Eyes: Conjunctiva pink, no icterus. Mouth: Oropharyngeal mucosa moist and pink , no lesions erythema or exudate. Neck: Supple without thyromegaly, masses, or lymphadenopathy.  Lungs: Clear to auscultation bilaterally.  Heart: Regular rate and rhythm, no murmurs rubs or gallops.  Abdomen: Bowel sounds are normal, nontender, nondistended, no hepatosplenomegaly or masses, no abdominal bruits or     hernia , no rebound or guarding.   Rectal: not performed Extremities: No lower extremity edema. No clubbing or deformities.  Neuro: Alert and oriented x 4 , grossly normal neurologically.  Skin: Warm and dry, no rash or jaundice.   Psych: Alert and cooperative, normal mood and affect.  Labs: Lab Results  Component Value Date   TSH 2.110 05/12/2019   Lab Results  Component Value Date   CREATININE 0.86 05/12/2019   BUN 11 05/12/2019   NA 141 05/12/2019   K 4.3 05/12/2019  CL 101 05/12/2019   CO2 25 05/12/2019   Lab Results  Component Value Date   ALT 14 05/12/2019   AST 20 05/12/2019   ALKPHOS 76 05/12/2019   BILITOT <0.2 05/12/2019   Lab Results  Component Value Date   WBC 6.1 05/12/2019   HGB 11.5 05/12/2019   HCT 35.4 05/12/2019   MCV 89 05/12/2019   PLT 266 05/12/2019     Imaging Studies: No results found.

## 2019-05-27 NOTE — Patient Instructions (Signed)
1. Colonoscopy as scheduled. See separate instructions.  

## 2019-06-06 DIAGNOSIS — F0390 Unspecified dementia without behavioral disturbance: Secondary | ICD-10-CM | POA: Diagnosis not present

## 2019-06-06 DIAGNOSIS — G20 Parkinson's disease: Secondary | ICD-10-CM | POA: Diagnosis not present

## 2019-06-06 DIAGNOSIS — G309 Alzheimer's disease, unspecified: Secondary | ICD-10-CM | POA: Diagnosis not present

## 2019-06-18 DIAGNOSIS — R270 Ataxia, unspecified: Secondary | ICD-10-CM | POA: Diagnosis not present

## 2019-06-18 DIAGNOSIS — R410 Disorientation, unspecified: Secondary | ICD-10-CM | POA: Diagnosis not present

## 2019-06-18 DIAGNOSIS — Z1589 Genetic susceptibility to other disease: Secondary | ICD-10-CM | POA: Diagnosis not present

## 2019-06-18 DIAGNOSIS — R413 Other amnesia: Secondary | ICD-10-CM | POA: Diagnosis not present

## 2019-06-18 DIAGNOSIS — F0390 Unspecified dementia without behavioral disturbance: Secondary | ICD-10-CM | POA: Diagnosis not present

## 2019-06-18 DIAGNOSIS — G249 Dystonia, unspecified: Secondary | ICD-10-CM | POA: Diagnosis not present

## 2019-06-18 DIAGNOSIS — Z820 Family history of epilepsy and other diseases of the nervous system: Secondary | ICD-10-CM | POA: Diagnosis not present

## 2019-06-18 DIAGNOSIS — F69 Unspecified disorder of adult personality and behavior: Secondary | ICD-10-CM | POA: Diagnosis not present

## 2019-06-25 ENCOUNTER — Other Ambulatory Visit: Payer: Self-pay | Admitting: *Deleted

## 2019-06-25 DIAGNOSIS — K219 Gastro-esophageal reflux disease without esophagitis: Secondary | ICD-10-CM

## 2019-07-16 ENCOUNTER — Other Ambulatory Visit (HOSPITAL_COMMUNITY): Payer: Medicare Other

## 2019-08-20 ENCOUNTER — Other Ambulatory Visit: Payer: Self-pay

## 2019-08-20 ENCOUNTER — Encounter (HOSPITAL_COMMUNITY): Payer: Self-pay

## 2019-08-25 ENCOUNTER — Other Ambulatory Visit (HOSPITAL_COMMUNITY)
Admission: RE | Admit: 2019-08-25 | Discharge: 2019-08-25 | Disposition: A | Discharge status: Home / Self Care | Payer: Medicare Other | Source: Ambulatory Visit | Attending: Gastroenterology | Admitting: Gastroenterology

## 2019-08-25 ENCOUNTER — Other Ambulatory Visit: Payer: Self-pay

## 2019-08-25 ENCOUNTER — Encounter (HOSPITAL_COMMUNITY)
Admission: RE | Admit: 2019-08-25 | Discharge: 2019-08-25 | Disposition: A | Discharge status: Home / Self Care | Payer: Medicare Other | Source: Ambulatory Visit | Attending: Gastroenterology | Admitting: Gastroenterology

## 2019-08-25 DIAGNOSIS — Z01812 Encounter for preprocedural laboratory examination: Secondary | ICD-10-CM | POA: Diagnosis present

## 2019-08-25 DIAGNOSIS — Z20828 Contact with and (suspected) exposure to other viral communicable diseases: Secondary | ICD-10-CM | POA: Diagnosis not present

## 2019-08-25 LAB — SARS CORONAVIRUS 2 (TAT 6-24 HRS): SARS Coronavirus 2: NEGATIVE

## 2019-08-26 ENCOUNTER — Encounter (HOSPITAL_COMMUNITY): Payer: Self-pay | Admitting: Gastroenterology

## 2019-08-26 ENCOUNTER — Ambulatory Visit (HOSPITAL_COMMUNITY): Payer: Medicare Other | Admitting: Anesthesiology

## 2019-08-26 ENCOUNTER — Encounter (HOSPITAL_COMMUNITY)
Admission: RE | Disposition: A | Discharge status: Home / Self Care | Payer: Self-pay | Source: Home / Self Care | Attending: Gastroenterology

## 2019-08-26 ENCOUNTER — Ambulatory Visit (HOSPITAL_COMMUNITY)
Admission: RE | Admit: 2019-08-26 | Discharge: 2019-08-26 | Disposition: A | Discharge status: Home / Self Care | Payer: Medicare Other | Attending: Gastroenterology | Admitting: Gastroenterology

## 2019-08-26 ENCOUNTER — Other Ambulatory Visit: Payer: Self-pay

## 2019-08-26 DIAGNOSIS — Z860100 Personal history of colon polyps, unspecified: Secondary | ICD-10-CM

## 2019-08-26 DIAGNOSIS — K644 Residual hemorrhoidal skin tags: Secondary | ICD-10-CM | POA: Insufficient documentation

## 2019-08-26 DIAGNOSIS — E559 Vitamin D deficiency, unspecified: Secondary | ICD-10-CM | POA: Diagnosis not present

## 2019-08-26 DIAGNOSIS — E785 Hyperlipidemia, unspecified: Secondary | ICD-10-CM | POA: Diagnosis not present

## 2019-08-26 DIAGNOSIS — K648 Other hemorrhoids: Secondary | ICD-10-CM | POA: Diagnosis not present

## 2019-08-26 DIAGNOSIS — Z8249 Family history of ischemic heart disease and other diseases of the circulatory system: Secondary | ICD-10-CM | POA: Insufficient documentation

## 2019-08-26 DIAGNOSIS — Z791 Long term (current) use of non-steroidal anti-inflammatories (NSAID): Secondary | ICD-10-CM | POA: Insufficient documentation

## 2019-08-26 DIAGNOSIS — Z79899 Other long term (current) drug therapy: Secondary | ICD-10-CM | POA: Diagnosis not present

## 2019-08-26 DIAGNOSIS — K219 Gastro-esophageal reflux disease without esophagitis: Secondary | ICD-10-CM | POA: Insufficient documentation

## 2019-08-26 DIAGNOSIS — M1990 Unspecified osteoarthritis, unspecified site: Secondary | ICD-10-CM | POA: Diagnosis not present

## 2019-08-26 DIAGNOSIS — Q438 Other specified congenital malformations of intestine: Secondary | ICD-10-CM | POA: Diagnosis not present

## 2019-08-26 HISTORY — PX: COLONOSCOPY WITH PROPOFOL: SHX5780

## 2019-08-26 SURGERY — COLONOSCOPY WITH PROPOFOL
Anesthesia: General

## 2019-08-26 MED ORDER — KETAMINE HCL 10 MG/ML IJ SOLN
INTRAMUSCULAR | Status: DC | PRN
  Administered 2019-08-26: 15 mg via INTRAVENOUS

## 2019-08-26 MED ORDER — LACTATED RINGERS IV SOLN: Freq: Once | INTRAVENOUS | Status: AC

## 2019-08-26 MED ORDER — LIDOCAINE HCL (CARDIAC) PF 50 MG/5ML IV SOSY
PREFILLED_SYRINGE | INTRAVENOUS | Status: DC | PRN
  Administered 2019-08-26: 20 mg via INTRAVENOUS

## 2019-08-26 MED ORDER — CHLORHEXIDINE GLUCONATE CLOTH 2 % EX PADS: 6.0000 | MEDICATED_PAD | Freq: Once | CUTANEOUS | Status: DC

## 2019-08-26 MED ORDER — PROPOFOL 500 MG/50ML IV EMUL
INTRAVENOUS | Status: DC | PRN
  Administered 2019-08-26: 200 ug/kg/min via INTRAVENOUS

## 2019-08-26 MED ORDER — LACTATED RINGERS IV SOLN: INTRAVENOUS | Status: DC | PRN

## 2019-08-26 MED ORDER — PROPOFOL 10 MG/ML IV BOLUS
INTRAVENOUS | Status: DC | PRN
  Administered 2019-08-26: 20 mg via INTRAVENOUS
  Administered 2019-08-26: 40 mg via INTRAVENOUS

## 2019-08-26 MED ORDER — KETAMINE HCL 50 MG/5ML IJ SOSY
PREFILLED_SYRINGE | INTRAMUSCULAR | Status: AC
  Filled 2019-08-26: qty 5

## 2019-08-26 MED ORDER — STERILE WATER FOR IRRIGATION IR SOLN
Status: DC | PRN
  Administered 2019-08-26: 1.5 mL

## 2019-08-26 NOTE — Anesthesia Postprocedure Evaluation (Signed)
Anesthesia Post Note  Patient: Kristi Barnes  Procedure(s) Performed: COLONOSCOPY WITH PROPOFOL (N/A )  Patient location during evaluation: PACU Anesthesia Type: General Level of consciousness: awake and alert and patient cooperative Pain management: satisfactory to patient Vital Signs Assessment: post-procedure vital signs reviewed and stable Respiratory status: spontaneous breathing Cardiovascular status: stable Postop Assessment: no apparent nausea or vomiting Anesthetic complications: no     Last Vitals:  Vitals:   08/26/19 1159  BP: (!) 170/68  Pulse: 81  Resp: 16  Temp: 37.2 C    Last Pain:  Vitals:   08/26/19 1426  TempSrc:   PainSc: 0-No pain                 Garnet Chatmon

## 2019-08-26 NOTE — Anesthesia Procedure Notes (Addendum)
Date/Time: 08/26/2019 2:22 PM Performed by: Vista Deck, CRNA Pre-anesthesia Checklist: Patient identified, Emergency Drugs available, Suction available, Timeout performed and Patient being monitored Patient Re-evaluated:Patient Re-evaluated prior to induction Oxygen Delivery Method: Nasal Cannula

## 2019-08-26 NOTE — Anesthesia Preprocedure Evaluation (Addendum)
Anesthesia Evaluation  Patient identified by MRN, date of birth, ID band Patient awake    Reviewed: Allergy & Precautions, NPO status , Patient's Chart, lab work & pertinent test results  Airway Mallampati: II  TM Distance: >3 FB Neck ROM: Full    Dental no notable dental hx.  Fillings :   Pulmonary neg pulmonary ROS,    breath sounds clear to auscultation       Cardiovascular Exercise Tolerance: Good  Rhythm:Regular Rate:Normal     Neuro/Psych negative neurological ROS  negative psych ROS   GI/Hepatic GERD  Medicated and Controlled,  Endo/Other    Renal/GU      Musculoskeletal  (+) Arthritis , Osteoarthritis,    Abdominal   Peds  Hematology   Anesthesia Other Findings   Reproductive/Obstetrics                            Anesthesia Physical Anesthesia Plan  ASA: II  Anesthesia Plan: General   Post-op Pain Management:    Induction: Intravenous  PONV Risk Score and Plan: 0 and TIVA  Airway Management Planned: Nasal Cannula, Natural Airway and Simple Face Mask  Additional Equipment:   Intra-op Plan:   Post-operative Plan:   Informed Consent: I have reviewed the patients History and Physical, chart, labs and discussed the procedure including the risks, benefits and alternatives for the proposed anesthesia with the patient or authorized representative who has indicated his/her understanding and acceptance.     Dental advisory given  Plan Discussed with: CRNA  Anesthesia Plan Comments:         Anesthesia Quick Evaluation

## 2019-08-26 NOTE — Discharge Instructions (Signed)
Your prep was not good. YOU had fiber/foodl in your colon SO YOUR COLONOSCOPY EXAM IS INCOMPLETE. YOU DID NOT HAVE ANY POLYPS or mass but it was a limited exam.    DRINK WATER TO KEEP YOUR URINE LIGHT YELLOW.  FOLLOW A HIGH FIBER DIET. AVOID ITEMS THAT CAUSE BLOATING & GAS. SEE INFO BELOW.  REPEAT COLONOSCOPY WITHIN THE NEXT 3-6 mos. YOU NEED A SOFT LOW FIBER DIET THREE DAYS BEFORE THE NEXT COLONOSCOPY, FULL LIQUIDS ONLY WITH MIRALAX PREP STARTING TWO DAYS PRIOR TO YOUR COLONOSCOPY AND CLEAR LIQUIDS ONLY ON THE DAY BEFORE. TAKE THE BOWEL PRE STARTING ONE DAY PRIOR TO THE COLONOSCOPY.   Colonoscopy Care After Read the instructions outlined below and refer to this sheet in the next week. These discharge instructions provide you with general information on caring for yourself after you leave the hospital. While your treatment has been planned according to the most current medical practices available, unavoidable complications occasionally occur. If you have any problems or questions after discharge, call DR. Tawnie Ehresman, [phone].  ACTIVITY  You may resume your regular activity, but move at a slower pace for the next 24 hours.   Take frequent rest periods for the next 24 hours.   Walking will help get rid of the air and reduce the bloated feeling in your belly (abdomen).   No driving for 24 hours (because of the medicine (anesthesia) used during the test).   You may shower.   Do not sign any important legal documents or operate any machinery for 24 hours (because of the anesthesia used during the test).    NUTRITION  Drink plenty of fluids.   You may resume your normal diet as instructed by your doctor.   Begin with a light meal and progress to your normal diet. Heavy or fried foods are harder to digest and may make you feel sick to your stomach (nauseated).   Avoid alcoholic beverages for 24 hours or as instructed.    MEDICATIONS  You may resume your normal  medications.   WHAT YOU CAN EXPECT TODAY  Some feelings of bloating in the abdomen.   Passage of more gas than usual.   Spotting of blood in your stool or on the toilet paper  .  IF YOU HAD POLYPS REMOVED DURING THE COLONOSCOPY:  Eat a soft diet IF YOU HAVE NAUSEA, BLOATING, ABDOMINAL PAIN, OR VOMITING.    FINDING OUT THE RESULTS OF YOUR TEST Not all test results are available during your visit. DR. Oneida Alar WILL CALL YOU WITHIN 14 DAYS OF YOUR PROCEDUE WITH YOUR RESULTS. Do not assume everything is normal if you have not heard from DR. Sirinity Outland, CALL HER OFFICE AT [phone].  SEEK IMMEDIATE MEDICAL ATTENTION AND CALL THE OFFICE: [phone] IF:  You have more than a spotting of blood in your stool.   Your belly is swollen (abdominal distention).   You are nauseated or vomiting.   You have a temperature over 101F.   You have abdominal pain or discomfort that is severe or gets worse throughout the day.   High-Fiber Diet A high-fiber diet changes your normal diet to include more whole grains, legumes, fruits, and vegetables. Changes in the diet involve replacing refined carbohydrates with unrefined foods. The calorie level of the diet is essentially unchanged. The Dietary Reference Intake (recommended amount) for adult males is 38 grams per day. For adult females, it is 25 grams per day. Pregnant and lactating women should consume 28 grams of fiber per day.  Fiber is the intact part of a plant that is not broken down during digestion. Functional fiber is fiber that has been isolated from the plant to provide a beneficial effect in the body.  PURPOSE  Increase stool bulk.   Ease and regulate bowel movements.   Lower cholesterol.   REDUCE RISK OF COLON CANCER  INDICATIONS THAT YOU NEED MORE FIBER  Constipation and hemorrhoids.   Uncomplicated diverticulosis (intestine condition) and irritable bowel syndrome.   Weight management.   As a protective measure against  hardening of the arteries (atherosclerosis), diabetes, and cancer.   GUIDELINES FOR INCREASING FIBER IN THE DIET  Start adding fiber to the diet slowly. A gradual increase of about 5 more grams (2 servings of most fruits or vegetables) per day is best. Too rapid an increase in fiber may result in constipation, flatulence, and bloating.   Drink enough water and fluids to keep your urine clear or pale yellow. Water, juice, or caffeine-free drinks are recommended. Not drinking enough fluid may cause constipation.   Eat a variety of high-fiber foods rather than one type of fiber.   Try to increase your intake of fiber through using high-fiber foods rather than fiber pills or supplements that contain small amounts of fiber.   The goal is to change the types of food eaten. Do not supplement your present diet with high-fiber foods, but replace foods in your present diet.

## 2019-08-26 NOTE — Op Note (Signed)
[facility] Patient Name: Kristi Barnes Procedure Date: 08/26/2019 1:49 PM MRN: [id number] Date of Birth: [birth year] Attending MD: Barney Drain MD, MD CSN: [id number] Age: 68 Admit Type: Outpatient Procedure:                Colonoscopy, INCOMPLETE DUE TO POOR BOWEL PREP Indications:              Personal history of colonic polyps Providers:                Barney Drain MD, MD, Charlsie Quest. Theda Sers RN, RN,                            Aram Candela Referring MD:             Terald Sleeper Medicines:                Propofol per Anesthesia Complications:            No immediate complications. Estimated Blood Loss:     Estimated blood loss: none. Procedure:                Pre-Anesthesia Assessment:                           - Prior to the procedure, a History and Physical                            was performed, and patient medications and                            allergies were reviewed. The patient's tolerance of                            previous anesthesia was also reviewed. The risks                            and benefits of the procedure and the sedation                            options and risks were discussed with the patient.                            All questions were answered, and informed consent                            was obtained. Prior Anticoagulants: The patient has                            taken no previous anticoagulant or antiplatelet                            agents. ASA Grade Assessment: II - A patient with                            mild systemic disease. After reviewing the risks  and benefits, the patient was deemed in                            satisfactory condition to undergo the procedure.                            After obtaining informed consent, the colonoscope                            was passed under direct vision. Throughout the                            procedure, the patient's blood pressure, pulse, and                       oxygen saturations were monitored continuously. The                            [id number]-[id number] ([id number]) scope was introduced through                            the anus with the intention of advancing to the                            cecum. The scope was advanced to the ascending                            colon before the procedure was aborted. Medications                            were given. The colonoscopy was aborted due to                            inadequate bowel prep. The ileocecal valve,                            appendiceal orifice, and rectum were photographed.                            The patient tolerated the procedure well. The                            quality of the bowel preparation was poor. Scope In: 2:29:48 PM Scope Out: 2:43:29 PM Scope Withdrawal Time: 0 hours 10 minutes 16 seconds  Total Procedure Duration: 0 hours 13 minutes 41 seconds  Findings:      Semi-liquid stool was found in the entire colon, interfering with       visualization. Lavage of the area was performed using a large amount of       sterile water, resulting in incomplete clearance with fair       visualization. UNABLE TO ASPIRATE LIQUID DUE TO LARGE AMOUNT OF       PARTICULATE MATTER IN LUMEN.      External and internal hemorrhoids were found.      The recto-sigmoid colon, sigmoid colon and  descending colon were       moderately tortuous. Impression:               - The procedure was aborted due to inadequate bowel                            prep.                           - Preparation of the colon was poor.                           - Stool in the entire examined colon.                           - External and internal hemorrhoids.                           - Tortuous colon. Moderate Sedation:      Per Anesthesia Care Recommendation:           - Patient has a contact number available for                            emergencies. The signs and symptoms of potential                             delayed complications were discussed with the                            patient. Return to normal activities tomorrow.                            Written discharge instructions were provided to the                            patient.                           - High fiber diet.                           - Continue present medications.                           - Repeat colonoscopy within 3 months because the                            bowel preparation was suboptimal. PT NEEDS TO                            MODIFY DIET 3 DAYS PRIOR TO TCS. FULL                            LIQUIDS/MIRALAX PREP 2 DAYS PRIOR TO TCS. CLEAR  LIQUIDS/BOWEL PREP: 1 DAY PRIOR TO TCS. Procedure Code(s):        --- Professional ---                           [phone], 53, Colonoscopy, flexible; diagnostic,                            including collection of specimen(s) by brushing or                            washing, when performed (separate procedure) Diagnosis Code(s):        --- Professional ---                           K64.8, Other hemorrhoids                           Z86.010, Personal history of colonic polyps                           Q43.8, Other specified congenital malformations of                            intestine CPT copyright 2019 [employer]. All rights reserved. The codes documented in this report are preliminary and upon coder review may  be revised to meet current compliance requirements. Jonette EvaSandi Koree Schopf, MD Jonette EvaSandi Kc Sedlak MD, MD 08/26/2019 3:09:20 PM This report has been signed electronically. Number of Addenda: 0

## 2019-08-26 NOTE — Transfer of Care (Signed)
Immediate Anesthesia Transfer of Care Note  Patient: Kristi Barnes  Procedure(s) Performed: COLONOSCOPY WITH PROPOFOL (N/A )  Patient Location: PACU  Anesthesia Type:General  Level of Consciousness: awake, alert  and patient cooperative  Airway & Oxygen Therapy: Patient Spontanous Breathing  Post-op Assessment: Report given to RN and Post -op Vital signs reviewed and stable  Post vital signs: Reviewed and stable  Last Vitals:  Vitals Value Taken Time  BP    Temp 97.8   Pulse    Resp    SpO2      Last Pain:  Vitals:   08/26/19 1426  TempSrc:   PainSc: 0-No pain      Patients Stated Pain Goal: 5 (03/00/92 3300)  Complications: No apparent anesthesia complications

## 2019-08-26 NOTE — H&P (Signed)
Primary Care Physician:  Terald Sleeper, PA-C Primary Gastroenterologist:  Dr. Oneida Alar  Pre-Procedure History & Physical: HPI:  Kristi Barnes is a 68 y.o. female here for  PERSONAL HISTORY OF POLYPS.  Past Medical History:  Diagnosis Date  . Arthritis   . GERD (gastroesophageal reflux disease)   . Hyperlipidemia   . Vitamin D deficiency     Past Surgical History:  Procedure Laterality Date  . bunions Bilateral   . COLONOSCOPY  01/2013   Dr. Sherrlyn Hock: Fentanyl 150 mcg/Versed 6 mg, sessile polyp removed from the cecum, tortuous rectosigmoid colon.  Pathology revealed focal hyperplastic change, lymphoid aggregate of second fragment with colonic tissue.  Advised to have another colonoscopy in 5 to 10 years.    Prior to Admission medications   Medication Sig Start Date End Date Taking? Authorizing Provider  diclofenac sodium (VOLTAREN) 1 % GEL Apply 4 g topically 4 (four) times daily. Patient taking differently: Apply 4 g topically 4 (four) times daily as needed (pain.).  05/12/19  Yes Terald Sleeper, PA-C  Iron-Vitamins (GERITOL) LIQD Take 15 mLs by mouth daily.   Yes [provider]  latanoprost (XALATAN) 0.005 % ophthalmic solution Place 1 drop into both eyes at bedtime.  03/04/18  Yes [provider]  omeprazole (PRILOSEC) 20 MG capsule Take 1 capsule (20 mg total) by mouth daily. 05/12/19  Yes Terald Sleeper, PA-C  simvastatin (ZOCOR) 40 MG tablet Take 1 tablet (40 mg total) by mouth daily. 05/12/19  Yes Jones, Angel S, PA-C  SUPREP BOWEL PREP KIT 17.5-3.13-1.6 GM/177ML SOLN MIX AND DRINK ONE KIT AS DIRECTED FOR ONE DOSE. 05/01/19  Yes [provider]  traMADol (ULTRAM) 50 MG tablet Take 1 tablet (50 mg total) by mouth every 6 (six) hours as needed. for pain 05/12/19  Yes Terald Sleeper, PA-C  Vitamin D, Ergocalciferol, (DRISDOL) 1.25 MG (50000 UT) CAPS capsule TAKE 1 CAPSULE BY MOUTH EVERY 7 DAYS Patient taking differently: Take 50,000 Units by mouth every  Monday.  11/05/18  Yes Terald Sleeper, PA-C  vitamin E (VITAMIN E) 400 UNIT capsule Take 400 Units by mouth daily.    Yes [provider]    Allergies as of 05/27/2019 - Review Complete 05/27/2019  Allergen Reaction Noted  . Asa [aspirin] Other (See Comments) 09/22/2016    Family History  Problem Relation Age of Onset  . Heart attack Mother   . Cancer Father        lung  . Leukemia Sister   . Cancer Brother        back of neck  . Cancer Sister        back of neck  . Colon cancer Neg Hx     Social History   Socioeconomic History  . Marital status: Divorced    Spouse name: Not on file  . Number of children: Not on file  . Years of education: Not on file  . Highest education level: Not on file  Occupational History  . Not on file  Tobacco Use  . Smoking status: Never Smoker  . Smokeless tobacco: Never Used  Substance and Sexual Activity  . Alcohol use: No  . Drug use: No  . Sexual activity: Not Currently  Other Topics Concern  . Not on file  Social History Narrative  . Not on file   Social Determinants of Health   Financial Resource Strain:   . Difficulty of Paying Living Expenses: Not on file  Food  Insecurity:   . Worried About Charity fundraiser in the Last Year: Not on file  . Ran Out of Food in the Last Year: Not on file  Transportation Needs:   . Lack of Transportation (Medical): Not on file  . Lack of Transportation (Non-Medical): Not on file  Physical Activity:   . Days of Exercise per Week: Not on file  . Minutes of Exercise per Session: Not on file  Stress:   . Feeling of Stress : Not on file  Social Connections:   . Frequency of Communication with Friends and Family: Not on file  . Frequency of Social Gatherings with Friends and Family: Not on file  . Attends Religious Services: Not on file  . Active Member of Clubs or Organizations: Not on file  . Attends Archivist Meetings: Not on file  . Marital Status: Not on file   Intimate Partner Violence:   . Fear of Current or Ex-Partner: Not on file  . Emotionally Abused: Not on file  . Physically Abused: Not on file  . Sexually Abused: Not on file    Review of Systems: See HPI, otherwise negative ROS   Physical Exam: BP (!) 170/68   Pulse 81   Temp 98.9 F (37.2 C) (Oral)   Resp 16   Ht '5\' 2"'  (1.575 m)   Wt 73.5 kg   BMI 29.63 kg/m  General:   Alert,  pleasant and cooperative in NAD Head:  Normocephalic and atraumatic. Neck:  Supple; Lungs:  Clear throughout to auscultation.    Heart:  Regular rate and rhythm. Abdomen:  Soft, nontender and nondistended. Normal bowel sounds, without guarding, and without rebound.   Neurologic:  Alert and  oriented x4;  grossly normal neurologically.  Impression/Plan:      PERSONAL HISTORY OF POLYPS.  PLAN: 1. TCS TODAY. DISCUSSED PROCEDURE, BENEFITS, & RISKS: < 1% chance of medication reaction, bleeding, perforation, ASPIRATION, or rupture of spleen/liver requiring surgery to fix it and missed polyps < 1 cm 10-20% of the time.

## 2019-09-22 ENCOUNTER — Other Ambulatory Visit: Payer: Self-pay | Admitting: Physician Assistant

## 2019-09-22 DIAGNOSIS — E7800 Pure hypercholesterolemia, unspecified: Secondary | ICD-10-CM

## 2019-09-29 ENCOUNTER — Other Ambulatory Visit: Payer: Self-pay | Admitting: Physician Assistant

## 2019-09-29 DIAGNOSIS — E559 Vitamin D deficiency, unspecified: Secondary | ICD-10-CM

## 2019-10-09 ENCOUNTER — Other Ambulatory Visit: Payer: Self-pay

## 2019-10-13 ENCOUNTER — Ambulatory Visit: Payer: Medicare Other

## 2019-11-04 ENCOUNTER — Telehealth: Payer: Self-pay | Admitting: Gastroenterology

## 2019-11-04 NOTE — Telephone Encounter (Signed)
-   Repeat colonoscopy within 3 months because the bowel preparation was suboptimal. PT NEEDS TO MODIFY DIET 3 DAYS PRIOR TO TCS. FULL LIQUIDS/MIRALAX PREP 2 DAYS PRIOR TO TCS. CLEAR LIQUIDS/BOWEL PREP: 1 DAY PRIOR TO TCS   Per [facility] recommendations from 08/26/2019 procedure note

## 2019-11-04 NOTE — Telephone Encounter (Signed)
Please schedule OV.  

## 2019-11-05 ENCOUNTER — Ambulatory Visit (INDEPENDENT_AMBULATORY_CARE_PROVIDER_SITE_OTHER): Payer: Medicare Other | Admitting: *Deleted

## 2019-11-05 ENCOUNTER — Encounter: Payer: Self-pay | Admitting: Gastroenterology

## 2019-11-05 DIAGNOSIS — Z0000 Encounter for general adult medical examination without abnormal findings: Secondary | ICD-10-CM | POA: Diagnosis not present

## 2019-11-05 NOTE — Progress Notes (Signed)
MEDICARE ANNUAL WELLNESS VISIT  11/05/2019  Telephone Visit Disclaimer This Medicare AWV was conducted by telephone due to national recommendations for restrictions regarding the COVID-19 Pandemic (e.g. social distancing).  I verified, using two identifiers, that I am speaking with Kristi Barnes or their authorized healthcare agent. I discussed the limitations, risks, security, and privacy concerns of performing an evaluation and management service by telephone and the potential availability of an in-person appointment in the future. The patient expressed understanding and agreed to proceed.   Subjective:  Kristi Barnes is a 69 y.o. female patient of Kristi Sleeper, PA-C who had a Medicare Annual Wellness Visit today via telephone. [name] is Retired and lives alone. she has 2 children and has a twin sister that visits often. she reports that she is socially active and does interact with friends/family regularly. she is moderately physically active and enjoys staying active in [place] and walking.  Patient Care Team: Kristi Barnes as PCP - General (Physician Assistant) Kristi Binder, MD as Consulting Physician (Gastroenterology)  Advanced Directives 11/05/2019 08/26/2019 08/20/2019  Does Patient Have a Medical Advance Directive? No No No  Would patient like information on creating a medical advance directive? No - Patient declined No - Patient declined No - Patient declined    Hospital Utilization Over the Past 12 Months: # of hospitalizations or ER visits: 0 # of surgeries: 1  Review of Systems    Patient reports that her overall health is unchanged compared to last year.  History obtained from chart review  Patient Reported Readings (BP, Pulse, CBG, Weight, etc) none  Pain Assessment Pain : No/denies pain     Current Medications & Allergies (verified) Allergies as of 11/05/2019      Reactions   Asa [aspirin] Other (See Comments)   Upset stomach       Medication  List       Accurate as of November 05, 2019 10:31 AM. If you have any questions, ask your nurse or doctor.        diclofenac sodium 1 % Gel Commonly known as: VOLTAREN Apply 4 g topically 4 (four) times daily. What changed:   when to take this  reasons to take this   Geritol Liqd Take 15 mLs by mouth daily.   latanoprost 0.005 % ophthalmic solution Commonly known as: XALATAN Place 1 drop into both eyes at bedtime.   omeprazole 20 MG capsule Commonly known as: PRILOSEC Take 1 capsule (20 mg total) by mouth daily.   simvastatin 40 MG tablet Commonly known as: ZOCOR TAKE ONE TABLET BY MOUTH DAILY.   Suprep Bowel Prep Kit 17.5-3.13-1.6 GM/177ML Soln Generic drug: Na Sulfate-K Sulfate-Mg Sulf MIX AND DRINK ONE KIT AS DIRECTED FOR ONE DOSE.   traMADol 50 MG tablet Commonly known as: ULTRAM Take 1 tablet (50 mg total) by mouth every 6 (six) hours as needed. for pain   Vitamin D (Ergocalciferol) 1.25 MG (50000 UNIT) Caps capsule Commonly known as: DRISDOL TAKE ONE CAPSULE BY MOUTH EVERY 7 DAYS.   vitamin E 180 MG (400 UNITS) capsule Generic drug: vitamin E Take 400 Units by mouth daily.       History (reviewed): Past Medical History:  Diagnosis Date  . Arthritis   . GERD (gastroesophageal reflux disease)   . Hyperlipidemia   . Vitamin D deficiency    Past Surgical History:  Procedure Laterality Date  . bunions Bilateral   . COLONOSCOPY  01/2013   Dr.  Sherrlyn Barnes: Fentanyl 150 mcg/Versed 6 mg, sessile polyp removed from the cecum, tortuous rectosigmoid colon.  Pathology revealed focal hyperplastic change, lymphoid aggregate of second fragment with colonic tissue.  Advised to have another colonoscopy in 5 to 10 years.  . COLONOSCOPY WITH PROPOFOL N/A 08/26/2019   Procedure: COLONOSCOPY WITH PROPOFOL;  Surgeon: Kristi Binder, MD;  Location: [facility];  Service: Endoscopy;  Laterality: N/A;  1:30pm   Family History  Problem Relation Age of Onset  . Heart  attack Mother   . Cancer Father        lung  . Leukemia Sister   . Cancer Brother        back of neck  . Cancer Sister        back of neck  . Colon cancer Neg Hx    Social History   Socioeconomic History  . Marital status: Divorced    Spouse name: Not on file  . Number of children: 2  . Years of education: 49  . Highest education level: High school graduate  Occupational History  . Occupation: retired  Tobacco Use  . Smoking status: Never Smoker  . Smokeless tobacco: Never Used  Substance and Sexual Activity  . Alcohol use: No  . Drug use: No  . Sexual activity: Not Currently  Other Topics Concern  . Not on file  Social History Narrative  . Not on file   Social Determinants of Health   Financial Resource Strain: Low Risk   . Difficulty of Paying Living Expenses: Not very hard  Food Insecurity: No Food Insecurity  . Worried About Charity fundraiser in the Last Year: Never true  . Ran Out of Food in the Last Year: Never true  Transportation Needs: No Transportation Needs  . Lack of Transportation (Medical): No  . Lack of Transportation (Non-Medical): No  Physical Activity: Sufficiently Active  . Days of Exercise per Week: 7 days  . Minutes of Exercise per Session: 30 min  Stress: No Stress Concern Present  . Feeling of Stress : Only a little  Social Connections: Slightly Isolated  . Frequency of Communication with Friends and Family: More than three times a week  . Frequency of Social Gatherings with Friends and Family: More than three times a week  . Attends Religious Services: More than 4 times per year  . Active Member of Clubs or Organizations: Yes  . Attends Archivist Meetings: More than 4 times per year  . Marital Status: Divorced    Activities of Daily Living In your present state of health, do you have any difficulty performing the following activities: 11/05/2019 08/20/2019  Hearing? N N  Vision? Y Y  Comment has cataracts/glaucoma-wears  glasses-gets eye exam every 2 years -  Difficulty concentrating or making decisions? N N  Walking or climbing stairs? Y Y  Comment due to arthritis arthritis  Dressing or bathing? Y N  Comment arthritis makes it difficult-takes her much longer -  Doing errands, shopping? Y N  Comment due to her arthritis-her sister helps her -  Conservation officer, nature and eating ? N -  Using the Toilet? N -  In the past six months, have you accidently leaked urine? Y -  Comment wears a pad at all times -  Do you have problems with loss of bowel control? N -  Managing your Medications? N -  Managing your Finances? N -  Housekeeping or managing your Housekeeping? Y -  Comment due  to arthritis-her sister helps -  Some recent data might be hidden    Patient Education/ Literacy How often do you need to have someone help you when you read instructions, pamphlets, or other written materials from your doctor or pharmacy?: 1 - Never What is the last grade level you completed in school?: 12th grade  Exercise Current Exercise Habits: Home exercise routine, Type of exercise: treadmill, Time (Minutes): 30, Frequency (Times/Week): 7, Weekly Exercise (Minutes/Week): 210, Intensity: Mild, Exercise limited by: orthopedic condition(s)  Diet Patient reports consuming 2 meals a day and 2 snack(s) a day Patient reports that her primary diet is: Regular Patient reports that she does have regular access to food.   Depression Screen PHQ 2/9 Scores 11/05/2019 05/12/2019 11/05/2018 03/21/2018 09/21/2017 07/30/2017 03/23/2017  PHQ - 2 Score 2 0 '4 4 2 4 4  ' PHQ- 9 Score 7 - '19 16 8 10 14     ' Fall Risk Fall Risk  11/05/2019 05/12/2019 03/21/2018 09/21/2017 03/23/2017  Falls in the past year? 0 1 Yes No No  Number falls in past yr: - 1 2 or more - -  Injury with Fall? - 1 No - -  Follow up - Falls evaluation completed - - -     Objective:  Kristi Barnes seemed alert and oriented and she participated appropriately during our telephone  visit.  Blood Pressure Weight BMI  BP Readings from Last 3 Encounters:  08/26/19 113/83  05/27/19 (!) 151/78  05/12/19 (!) 154/80   Wt Readings from Last 3 Encounters:  08/26/19 162 lb (73.5 kg)  05/27/19 170 lb 12.8 oz (77.5 kg)  05/12/19 173 lb 12.8 oz (78.8 kg)   BMI Readings from Last 1 Encounters:  08/26/19 29.63 kg/m    *Unable to obtain current vital signs, weight, and BMI due to telephone visit type  Hearing/Vision  . Yamil did not seem to have difficulty with hearing/understanding during the telephone conversation . Reports that she has had a formal eye exam by an eye care professional within the past year . Reports that she has not had a formal hearing evaluation within the past year *Unable to fully assess hearing and vision during telephone visit type  Cognitive Function: 6CIT Screen 11/05/2019  What Year? 0 points  What month? 0 points  What time? 0 points  Count back from 20 0 points  Months in reverse 4 points  Repeat phrase 2 points  Total Score 6   (Normal:0-7, Significant for Dysfunction: >8)  Normal Cognitive Function Screening: Yes   Immunization & Health Maintenance Record Immunization History  Administered Date(s) Administered  . Td 08/18/1999    Health Maintenance  Topic Date Due  . Hepatitis C Screening  1950/09/19  . TETANUS/TDAP  08/17/2009  . DEXA SCAN  01/18/2018  . PNA vac Low Risk Adult (1 of 2 - PCV13) 01/18/2018  . INFLUENZA VACCINE  03/29/2019  . MAMMOGRAM  02/11/2021  . COLONOSCOPY  08/25/2029       Assessment  This is a routine wellness examination for [place] Maintenance: Due or Overdue Health Maintenance Due  Topic Date Due  . Hepatitis C Screening  09-Aug-1951  . TETANUS/TDAP  08/17/2009  . DEXA SCAN  01/18/2018  . PNA vac Low Risk Adult (1 of 2 - PCV13) 01/18/2018  . INFLUENZA VACCINE  03/29/2019    Kristi Barnes does not need a referral for Community Assistance: Care Management:   no Social  Work:  no Prescription Assistance:  no Nutrition/Diabetes Education:  no   Plan:  Personalized Goals Goals Addressed            This Visit's Progress   . DIET - INCREASE WATER INTAKE       Try to drink 6-8 glasses of water daily      Personalized Health Maintenance & Screening Recommendations  Pneumococcal vaccine  Influenza vaccine Td vaccine Bone densitometry screening Shingles vaccine  Lung Cancer Screening Recommended: no (Low Dose CT Chest recommended if Age 57-80 years, 30 pack-year currently smoking OR have quit w/in past 15 years) Hepatitis C Screening recommended: no HIV Screening recommended: no  Advanced Directives: Written information was not prepared per patient's request.  Referrals & Orders No orders of the defined types were placed in this encounter.   Follow-up Plan . Follow-up with Kristi Sleeper, PA-C as planned . Schedule your DEXA scan as discussed . Consider Flu, TDAP, Shingles and Prevnar vaccines    I have personally reviewed and noted the following in the patient's chart:   . Medical and social history . Use of alcohol, tobacco or illicit drugs  . Current medications and supplements . Functional ability and status . Nutritional status . Physical activity . Advanced directives . List of other physicians . Hospitalizations, surgeries, and ER visits in previous 12 months . Vitals . Screenings to include cognitive, depression, and falls . Referrals and appointments  In addition, I have reviewed and discussed with Kristi Barnes certain preventive protocols, quality metrics, and best practice recommendations. A written personalized care plan for preventive services as well as general preventive health recommendations is available and can be mailed to the patient at her request.      Milas Hock, LPN  2/75/1700

## 2019-11-05 NOTE — Patient Instructions (Signed)
Preventive Care 38 Years and Older, Female Preventive care refers to lifestyle choices and visits with your health care provider that can promote health and wellness. This includes:  A yearly physical exam. This is also called an annual well check.  Regular dental and eye exams.  Immunizations.  Screening for certain conditions.  Healthy lifestyle choices, such as diet and exercise. What can I expect for my preventive care visit? Physical exam Your health care provider will check:  Height and weight. These may be used to calculate body mass index (BMI), which is a measurement that tells if you are at a healthy weight.  Heart rate and blood pressure.  Your skin for abnormal spots. Counseling Your health care provider may ask you questions about:  Alcohol, tobacco, and drug use.  Emotional well-being.  Home and relationship well-being.  Sexual activity.  Eating habits.  History of falls.  Memory and ability to understand (cognition).  Work and work Statistician.  Pregnancy and menstrual history. What immunizations do I need?  Influenza (flu) vaccine  This is recommended every year. Tetanus, diphtheria, and pertussis (Tdap) vaccine  You may need a Td booster every 10 years. Varicella (chickenpox) vaccine  You may need this vaccine if you have not already been vaccinated. Zoster (shingles) vaccine  You may need this after age 33. Pneumococcal conjugate (PCV13) vaccine  One dose is recommended after age 33. Pneumococcal polysaccharide (PPSV23) vaccine  One dose is recommended after age 72. Measles, mumps, and rubella (MMR) vaccine  You may need at least one dose of MMR if you were born in 1957 or later. You may also need a second dose. Meningococcal conjugate (MenACWY) vaccine  You may need this if you have certain conditions. Hepatitis A vaccine  You may need this if you have certain conditions or if you travel or work in places where you may be exposed  to hepatitis A. Hepatitis B vaccine  You may need this if you have certain conditions or if you travel or work in places where you may be exposed to hepatitis B. Haemophilus influenzae type b (Hib) vaccine  You may need this if you have certain conditions. You may receive vaccines as individual doses or as more than one vaccine together in one shot (combination vaccines). Talk with your health care provider about the risks and benefits of combination vaccines. What tests do I need? Blood tests  Lipid and cholesterol levels. These may be checked every 5 years, or more frequently depending on your overall health.  Hepatitis C test.  Hepatitis B test. Screening  Lung cancer screening. You may have this screening every year starting at age 39 if you have a 30-pack-year history of smoking and currently smoke or have quit within the past 15 years.  Colorectal cancer screening. All adults should have this screening starting at age 36 and continuing until age 15. Your health care provider may recommend screening at age 23 if you are at increased risk. You will have tests every 1-10 years, depending on your results and the type of screening test.  Diabetes screening. This is done by checking your blood sugar (glucose) after you have not eaten for a while (fasting). You may have this done every 1-3 years.  Mammogram. This may be done every 1-2 years. Talk with your health care provider about how often you should have regular mammograms.  BRCA-related cancer screening. This may be done if you have a family history of breast, ovarian, tubal, or peritoneal cancers.  Other tests  Sexually transmitted disease (STD) testing.  Bone density scan. This is done to screen for osteoporosis. You may have this done starting at age 44. Follow these instructions at home: Eating and drinking  Eat a diet that includes fresh fruits and vegetables, whole grains, lean protein, and low-fat dairy products. Limit  your intake of foods with high amounts of sugar, saturated fats, and salt.  Take vitamin and mineral supplements as recommended by your health care provider.  Do not drink alcohol if your health care provider tells you not to drink.  If you drink alcohol: ? Limit how much you have to 0-1 drink a day. ? Be aware of how much alcohol is in your drink. In the [place] one drink equals one 12 oz bottle of beer (355 mL), one 5 oz glass of wine (148 mL), or one 1 oz glass of hard liquor (44 mL). Lifestyle  Take daily care of your teeth and gums.  Stay active. Exercise for at least 30 minutes on 5 or more days each week.  Do not use any products that contain nicotine or tobacco, such as cigarettes, e-cigarettes, and chewing tobacco. If you need help quitting, ask your health care provider.  If you are sexually active, practice safe sex. Use a condom or other form of protection in order to prevent STIs (sexually transmitted infections).  Talk with your health care provider about taking a low-dose aspirin or statin. What's next?  Go to your health care provider once a year for a well check visit.  Ask your health care provider how often you should have your eyes and teeth checked.  Stay up to date on all vaccines. This information is not intended to replace advice given to you by your health care provider. Make sure you discuss any questions you have with your health care provider. Document Revised: 08/08/2018 Document Reviewed: 08/08/2018 Elsevier Patient Education  2020 [employer].

## 2019-11-05 NOTE — Telephone Encounter (Signed)
OV made and letter mailed °

## 2019-11-06 ENCOUNTER — Other Ambulatory Visit: Payer: Self-pay

## 2019-11-07 ENCOUNTER — Other Ambulatory Visit: Payer: Self-pay

## 2019-11-07 ENCOUNTER — Encounter: Payer: Self-pay | Admitting: Physician Assistant

## 2019-11-07 ENCOUNTER — Ambulatory Visit (INDEPENDENT_AMBULATORY_CARE_PROVIDER_SITE_OTHER): Payer: Medicare Other | Admitting: Physician Assistant

## 2019-11-07 ENCOUNTER — Ambulatory Visit: Payer: Medicare Other | Admitting: Physician Assistant

## 2019-11-07 DIAGNOSIS — G8929 Other chronic pain: Secondary | ICD-10-CM | POA: Diagnosis not present

## 2019-11-07 DIAGNOSIS — M545 Low back pain, unspecified: Secondary | ICD-10-CM

## 2019-11-07 DIAGNOSIS — E7800 Pure hypercholesterolemia, unspecified: Secondary | ICD-10-CM | POA: Diagnosis not present

## 2019-11-07 DIAGNOSIS — M170 Bilateral primary osteoarthritis of knee: Secondary | ICD-10-CM | POA: Diagnosis not present

## 2019-11-07 MED ORDER — TRAMADOL HCL 50 MG PO TABS: 50.0000 mg | ORAL_TABLET | Freq: Four times a day (QID) | ORAL | 3 refills | Status: DC | PRN

## 2019-11-07 MED ORDER — SIMVASTATIN 40 MG PO TABS: 40.0000 mg | ORAL_TABLET | Freq: Every day | ORAL | 3 refills | Status: DC

## 2019-11-11 NOTE — Progress Notes (Signed)
BP 139/73   Pulse 65   Temp 98.4 F (36.9 C)   Ht _0  (1.575 m)   Wt 170 lb 6.4 oz (77.3 kg)   SpO2 97%   BMI 31.17 kg/m    Subjective:    Patient ID: Kristi Barnes, female    DOB: 10/11/1950, 69 y.o.   MRN: [id number]  HPI 1. Primary osteoarthritis of both knees 2. Chronic midline low back pain without sciatica 3. Pure hypercholesterolemia  HPI: Kristi Barnes is a 69 y.o. female presenting on 11/07/2019 for Follow-up (pain)  PAIN ASSESSMENT: Cause of pain- OA of hands, knees, hips, back  This patient returns for a 6 month recheck on narcotic use for the above named conditions  Current medications-tramadol 50 mg 1 up to 4 times a day as needed prescription is refilled today Diclofenac gel 1% apply 4 times a day to affected joints Patient is unable to take oral because of her GERD  Medication side effects- none Any concerns- no  Pain on scale of 1-10- 7 Frequency- daily What increases pain- walking What makes pain Better- rest Effects on ADL - mild Any change in general medical condition- no  Effectiveness of current meds- good Adverse reactions form pain meds-no PMP AWARE website reviewed: Yes Any suspicious activity on PMP Aware: No MME daily dose: 20, filled every other month  Contract on file 11/07/2018 Last UDS  05/18/19 good results  [place] script sent or current  History of overdose or risk of abuse none  Past Medical History:  Diagnosis Date  . Arthritis   . GERD (gastroesophageal reflux disease)   . Hyperlipidemia   . Vitamin D deficiency    Relevant past medical, surgical, family and social history reviewed and updated as indicated. Interim medical history since our last visit reviewed. Allergies and medications reviewed and updated. DATA REVIEWED: CHART IN EPIC  Family History reviewed for pertinent findings.  Review of Systems  Constitutional: Negative.   HENT: Negative.   Eyes: Negative.   Respiratory: Negative.     Gastrointestinal: Negative.   Genitourinary: Negative.   Musculoskeletal: Positive for arthralgias, back pain, joint swelling and myalgias.    Allergies as of 11/07/2019      Reactions   Asa [aspirin] Other (See Comments)   Upset stomach       Medication List       Accurate as of November 07, 2019 11:59 PM. If you have any questions, ask your nurse or doctor.        diclofenac sodium 1 % Gel Commonly known as: VOLTAREN Apply 4 g topically 4 (four) times daily. What changed:   when to take this  reasons to take this   Geritol Liqd Take 15 mLs by mouth daily.   latanoprost 0.005 % ophthalmic solution Commonly known as: XALATAN Place 1 drop into both eyes at bedtime.   omeprazole 20 MG capsule Commonly known as: PRILOSEC Take 1 capsule (20 mg total) by mouth daily.   simvastatin 40 MG tablet Commonly known as: ZOCOR Take 1 tablet (40 mg total) by mouth daily.   Suprep Bowel Prep Kit 17.5-3.13-1.6 GM/177ML Soln Generic drug: Na Sulfate-K Sulfate-Mg Sulf MIX AND DRINK ONE KIT AS DIRECTED FOR ONE DOSE.   traMADol 50 MG tablet Commonly known as: ULTRAM Take 1 tablet (50 mg total) by mouth every 6 (six) hours as needed. for pain   Vitamin D (Ergocalciferol) 1.25 MG (50000 UNIT) Caps capsule Commonly known as: DRISDOL TAKE ONE CAPSULE  BY MOUTH EVERY 7 DAYS.   vitamin E 180 MG (400 UNITS) capsule Generic drug: vitamin E Take 400 Units by mouth daily.          Objective:    BP 139/73   Pulse 65   Temp 98.4 F (36.9 C)   Ht _0  (1.575 m)   Wt 170 lb 6.4 oz (77.3 kg)   SpO2 97%   BMI 31.17 kg/m   Allergies  Allergen Reactions  . Asa [Aspirin] Other (See Comments)    Upset stomach      Wt Readings from Last 3 Encounters:  11/07/19 170 lb 6.4 oz (77.3 kg)  08/26/19 162 lb (73.5 kg)  05/27/19 170 lb 12.8 oz (77.5 kg)    Physical Exam Constitutional:      General: She is not in acute distress.    Appearance: Normal appearance. She is  well-developed.  HENT:     Head: Normocephalic and atraumatic.  Cardiovascular:     Rate and Rhythm: Normal rate.  Pulmonary:     Effort: Pulmonary effort is normal.  Musculoskeletal:     Lumbar back: Spasms, tenderness and bony tenderness present. Decreased range of motion.  Skin:    General: Skin is warm and dry.     Findings: No rash.  Neurological:     Mental Status: She is alert and oriented to person, place, and time.     Deep Tendon Reflexes: Reflexes are normal and symmetric.     Results for orders placed or performed during the hospital encounter of 08/25/19  SARS CORONAVIRUS 2 (TAT 6-24 HRS) Nasopharyngeal Nasopharyngeal Swab   Specimen: Nasopharyngeal Swab  Result Value Ref Range   SARS Coronavirus 2 NEGATIVE NEGATIVE      Assessment & Plan:   1. Primary osteoarthritis of both knees - traMADol (ULTRAM) 50 MG tablet; Take 1 tablet (50 mg total) by mouth every 6 (six) hours as needed. for pain  Dispense: 120 tablet; Refill: 3  2. Chronic midline low back pain without sciatica - traMADol (ULTRAM) 50 MG tablet; Take 1 tablet (50 mg total) by mouth every 6 (six) hours as needed. for pain  Dispense: 120 tablet; Refill: 3  3. Pure hypercholesterolemia - simvastatin (ZOCOR) 40 MG tablet; Take 1 tablet (40 mg total) by mouth daily.  Dispense: 90 tablet; Refill: 3   Continue all other maintenance medications as listed above.  Follow up plan: 6 month  Educational handout given for [place] PA-C [facility] [address]  [place],  [address] [phone]   11/11/2019, 1:46 PM

## 2019-11-26 ENCOUNTER — Ambulatory Visit (INDEPENDENT_AMBULATORY_CARE_PROVIDER_SITE_OTHER): Payer: Medicare Other | Admitting: Gastroenterology

## 2019-11-26 ENCOUNTER — Other Ambulatory Visit: Payer: Self-pay

## 2019-11-26 ENCOUNTER — Telehealth: Payer: Self-pay | Admitting: Internal Medicine

## 2019-11-26 ENCOUNTER — Encounter: Payer: Self-pay | Admitting: Gastroenterology

## 2019-11-26 VITALS — BP 130/66 | HR 67 | Temp 96.9°F | Ht 62.0 in | Wt 170.4 lb

## 2019-11-26 DIAGNOSIS — Z86010 Personal history of colonic polyps: Secondary | ICD-10-CM | POA: Diagnosis not present

## 2019-11-26 MED ORDER — PEG 3350-KCL-NA BICARB-NACL 420 G PO SOLR: 4000.0000 mL | ORAL | 0 refills | Status: DC

## 2019-11-26 NOTE — Telephone Encounter (Signed)
Noted, pt failed Suprep in past.

## 2019-11-26 NOTE — Progress Notes (Signed)
Cc'ed to pcp °

## 2019-11-26 NOTE — Progress Notes (Signed)
Primary Care Physician:  Remus Loffler, PA-C  Primary Gastroenterologist:  Jonette Eva, MD   Chief Complaint  Patient presents with  . Follow-up    colon polyps    HPI:  Kristi Barnes is a 69 y.o. female here to schedule repeat colonoscopy as recent one was incomplete due to suboptimal bowel prep.   Patient states she completed bowel prep as prescribed previously.  She feels like Suprep caused her blood pressure go up.  No history of hypertension.  Felt dizzy and weak with bowel prep.  Bowel movements have been regular.  Goes every day on a high-fiber diet.  Did not feel like she was constipated prior to colonoscopy.  She drinks a lot of water.  Denies abdominal pain, melena, rectal bleeding, upper GI symptoms.  Has been under a lot of stress with multiple family deaths recently.  Dr. Darrick Penna recommends modified bowel prep prior to next colonoscopy.  Patient interested in pursuing.  Current Outpatient Medications  Medication Sig Dispense Refill  . diclofenac sodium (VOLTAREN) 1 % GEL Apply 4 g topically 4 (four) times daily. (Patient taking differently: Apply 4 g topically 4 (four) times daily as needed (pain.). ) 300 g 11  . Iron-Vitamins (GERITOL) LIQD Take 15 mLs by mouth daily.    Marland Kitchen omeprazole (PRILOSEC) 20 MG capsule Take 1 capsule (20 mg total) by mouth daily. 90 capsule 3  . simvastatin (ZOCOR) 40 MG tablet Take 1 tablet (40 mg total) by mouth daily. 90 tablet 3  . traMADol (ULTRAM) 50 MG tablet Take 1 tablet (50 mg total) by mouth every 6 (six) hours as needed. for pain 120 tablet 3  . Vitamin D, Ergocalciferol, (DRISDOL) 1.25 MG (50000 UNIT) CAPS capsule TAKE ONE CAPSULE BY MOUTH EVERY 7 DAYS. 12 capsule 3  . vitamin E (VITAMIN E) 400 UNIT capsule Take 400 Units by mouth daily.      No current facility-administered medications for this visit.    Allergies as of 11/26/2019 - Review Complete 11/26/2019  Allergen Reaction Noted  . Asa [aspirin] Other (See Comments) 09/22/2016     Past Medical History:  Diagnosis Date  . Arthritis   . GERD (gastroesophageal reflux disease)   . Hyperlipidemia   . Vitamin D deficiency     Past Surgical History:  Procedure Laterality Date  . bunions Bilateral   . COLONOSCOPY  01/2013   Dr. Mikal Plane: Fentanyl 150 mcg/Versed 6 mg, sessile polyp removed from the cecum, tortuous rectosigmoid colon.  Pathology revealed focal hyperplastic change, lymphoid aggregate of second fragment with colonic tissue.  Advised to have another colonoscopy in 5 to 10 years.  . COLONOSCOPY WITH PROPOFOL N/A 08/26/2019   Dr. Darrick Penna: INCOMPLETE DUE TO INADEQUATE BOWEL PREP. stool throughout the colon. hemorrhoids noted.    Family History  Problem Relation Age of Onset  . Heart attack Mother   . Cancer Father        lung  . Leukemia Sister   . Cancer Brother        back of neck  . Cancer Sister        back of neck  . Colon cancer Neg Hx     Social History   Socioeconomic History  . Marital status: Divorced    Spouse name: Not on file  . Number of children: 2  . Years of education: 32  . Highest education level: High school graduate  Occupational History  . Occupation: retired  Tobacco Use  . Smoking  status: Never Smoker  . Smokeless tobacco: Never Used  Substance and Sexual Activity  . Alcohol use: No  . Drug use: No  . Sexual activity: Not Currently  Other Topics Concern  . Not on file  Social History Narrative  . Not on file   Social Determinants of Health   Financial Resource Strain: Low Risk   . Difficulty of Paying Living Expenses: Not very hard  Food Insecurity: No Food Insecurity  . Worried About Charity fundraiser in the Last Year: Never true  . Ran Out of Food in the Last Year: Never true  Transportation Needs: No Transportation Needs  . Lack of Transportation (Medical): No  . Lack of Transportation (Non-Medical): No  Physical Activity: Sufficiently Active  . Days of Exercise per Week: 7 days  . Minutes  of Exercise per Session: 30 min  Stress: No Stress Concern Present  . Feeling of Stress : Only a little  Social Connections: Slightly Isolated  . Frequency of Communication with Friends and Family: More than three times a week  . Frequency of Social Gatherings with Friends and Family: More than three times a week  . Attends Religious Services: More than 4 times per year  . Active Member of Clubs or Organizations: Yes  . Attends Archivist Meetings: More than 4 times per year  . Marital Status: Divorced  Human resources officer Violence: Not At Risk  . Fear of Current or Ex-Partner: No  . Emotionally Abused: No  . Physically Abused: No  . Sexually Abused: No      ROS:  General: Negative for anorexia, weight loss, fever, chills, fatigue, weakness. Eyes: Negative for vision changes.  ENT: Negative for hoarseness, difficulty swallowing , nasal congestion. CV: Negative for chest pain, angina, palpitations, dyspnea on exertion, peripheral edema.  Respiratory: Negative for dyspnea at rest, dyspnea on exertion, cough, sputum, wheezing.  GI: See history of present illness. GU:  Negative for dysuria, hematuria, urinary incontinence, urinary frequency, nocturnal urination.  MS: Negative for joint pain, low back pain.  Derm: Negative for rash or itching.  Neuro: Negative for weakness, abnormal sensation, seizure, frequent headaches, memory loss, confusion.  Psych: Negative for anxiety, depression, suicidal ideation, hallucinations.  Endo: Negative for unusual weight change.  Heme: Negative for bruising or bleeding. Allergy: Negative for rash or hives.    Physical Examination:  BP 130/66   Pulse 67   Temp (!) 96.9 F (36.1 C) (Temporal)   Ht 5\' 2"  (1.575 m)   Wt 170 lb 6.4 oz (77.3 kg)   BMI 31.17 kg/m    General: Well-nourished, well-developed in no acute distress.  Head: Normocephalic, atraumatic.   Eyes: Conjunctiva pink, no icterus. Mouth:masked Neck: Supple without  thyromegaly, masses, or lymphadenopathy.  Lungs: Clear to auscultation bilaterally.  Heart: Regular rate and rhythm, no murmurs rubs or gallops.  Abdomen: Bowel sounds are normal, nontender, nondistended, no hepatosplenomegaly or masses, no abdominal bruits or    hernia , no rebound or guarding.   Rectal: not Performed Extremities: No lower extremity edema. No clubbing or deformities.  Neuro: Alert and oriented x 4 , grossly normal neurologically.  Skin: Warm and dry, no rash or jaundice.   Psych: Alert and cooperative, normal mood and affect.  Labs: Lab Results  Component Value Date   CREATININE 0.86 05/12/2019   BUN 11 05/12/2019   NA 141 05/12/2019   K 4.3 05/12/2019   CL 101 05/12/2019   CO2 25 05/12/2019   Lab  Results  Component Value Date   ALT 14 05/12/2019   AST 20 05/12/2019   ALKPHOS 76 05/12/2019   BILITOT <0.2 05/12/2019   Lab Results  Component Value Date   WBC 6.1 05/12/2019   HGB 11.5 05/12/2019   HCT 35.4 05/12/2019   MCV 89 05/12/2019   PLT 266 05/12/2019     Imaging Studies: No results found.

## 2019-11-26 NOTE — Telephone Encounter (Signed)
[facility] called to let us know that the prep was on back order and asked if they could change it to Suprep. Per MB will can not change it to Suprep and to hold on to original prescription because patient's procedure isn't until June. If it is still on back order in June the provider will change it to something else.

## 2019-11-26 NOTE — Assessment & Plan Note (Signed)
69 y/o female presenting for second attempt at surveillance colonoscopy for history of colon polyps.  Plan for deep sedation given history of chronic pain medication and prior conscious sedation requirements.  Unfortunately her last colonoscopy attempt was incomplete due to poor bowel prep.  Plan for modified bowel prep as outlined by Dr. Darrick Penna.  I have discussed the risks, alternatives, benefits with regards to but not limited to the risk of reaction to medication, bleeding, infection, perforation and the patient is agreeable to proceed. Written consent to be obtained.  Full liquid diet 3 days prior to the procedure, clear liquids starting 2 days prior to procedure.  She will receive Linzess 145 mcg daily 3 days prior to procedure. Samples provided.  MiraLAX prep 2 days before procedure.  Complete second bowel prep day before procedure.  We will try to avoid Suprep given patient's concerns.  Preps are limited with some backorder in the setting of the pandemic.  She was instructed to consume adequate fluids to keep her urine light yellow in color.  May utilize Gatorade to help with electrolyte replacement.  Will check her labs 1 week prior to procedure to rule out any electrolyte abnormalities, as patient reported extreme weakness during bowel prep last time which could have been related to electrolyte issues. She plans to have her son with her during prep.

## 2019-11-26 NOTE — Patient Instructions (Signed)
1. Colonoscopy as scheduled. See separate instructions.  2. You will need to go for blood work one week before your procedure.

## 2020-01-30 ENCOUNTER — Encounter: Payer: Self-pay | Admitting: *Deleted

## 2020-02-11 NOTE — Patient Instructions (Signed)
Your procedure is scheduled on: 02/19/2020  Report to [facility] at     12:45 PM.  Call this number if you have problems the morning of surgery: [phone]   Remember:              Follow Directions on the letter you received from Your Physician's office regarding the Bowel Prep              No Smoking the day of Procedure :   Take these medicines the morning of surgery with A SIP OF WATER: omeprazole and tramadol   Do not wear jewelry, make-up or nail polish.    Do not bring valuables to the hospital.  Contacts, dentures or bridgework may not be worn into surgery.  .   Patients discharged the day of surgery will not be allowed to drive home.     Colonoscopy, Adult, Care After This sheet gives you information about how to care for yourself after your procedure. Your health care provider may also give you more specific instructions. If you have problems or questions, contact your health care provider. What can I expect after the procedure? After the procedure, it is common to have:  A small amount of blood in your stool for 24 hours after the procedure.  Some gas.  Mild abdominal cramping or bloating.  Follow these instructions at home: General instructions   For the first 24 hours after the procedure: ? Do not drive or use machinery. ? Do not sign important documents. ? Do not drink alcohol. ? Do your regular daily activities at a slower pace than normal. ? Eat soft, easy-to-digest foods. ? Rest often.  Take over-the-counter or prescription medicines only as told by your health care provider.  It is up to you to get the results of your procedure. Ask your health care provider, or the department performing the procedure, when your results will be ready. Relieving cramping and bloating  Try walking around when you have cramps or feel bloated.  Apply heat to your abdomen as told by your health care provider. Use a heat source that your health care provider  recommends, such as a moist heat pack or a heating pad. ? Place a towel between your skin and the heat source. ? Leave the heat on for 20-30 minutes. ? Remove the heat if your skin turns bright red. This is especially important if you are unable to feel pain, heat, or cold. You may have a greater risk of getting burned. Eating and drinking  Drink enough fluid to keep your urine clear or pale yellow.  Resume your normal diet as instructed by your health care provider. Avoid heavy or fried foods that are hard to digest.  Avoid drinking alcohol for as long as instructed by your health care provider. Contact a health care provider if:  You have blood in your stool 2-3 days after the procedure. Get help right away if:  You have more than a small spotting of blood in your stool.  You pass large blood clots in your stool.  Your abdomen is swollen.  You have nausea or vomiting.  You have a fever.  You have increasing abdominal pain that is not relieved with medicine. This information is not intended to replace advice given to you by your health care provider. Make sure you discuss any questions you have with your health care provider. Document Released: 03/28/2004 Document Revised: 05/08/2016 Document Reviewed: 10/26/2015 Elsevier Interactive Patient Education  2018  [employer].

## 2020-02-16 ENCOUNTER — Other Ambulatory Visit: Payer: Self-pay

## 2020-02-16 ENCOUNTER — Other Ambulatory Visit (HOSPITAL_COMMUNITY)
Admission: RE | Admit: 2020-02-16 | Discharge: 2020-02-16 | Disposition: A | Discharge status: Home / Self Care | Payer: Medicare Other | Source: Ambulatory Visit | Attending: Internal Medicine | Admitting: Internal Medicine

## 2020-02-16 ENCOUNTER — Encounter (HOSPITAL_COMMUNITY)
Admission: RE | Admit: 2020-02-16 | Discharge: 2020-02-16 | Disposition: A | Discharge status: Home / Self Care | Payer: Medicare Other | Source: Ambulatory Visit | Attending: Internal Medicine | Admitting: Internal Medicine

## 2020-02-16 ENCOUNTER — Encounter (HOSPITAL_COMMUNITY): Payer: Self-pay

## 2020-02-16 DIAGNOSIS — Z01812 Encounter for preprocedural laboratory examination: Secondary | ICD-10-CM | POA: Insufficient documentation

## 2020-02-16 DIAGNOSIS — Z20822 Contact with and (suspected) exposure to covid-19: Secondary | ICD-10-CM | POA: Insufficient documentation

## 2020-02-16 HISTORY — DX: Unspecified glaucoma: H40.9

## 2020-02-16 LAB — BASIC METABOLIC PANEL
Anion gap: 9 (ref 5–15)
BUN: 15 mg/dL (ref 8–23)
CO2: 26 mmol/L (ref 22–32)
Calcium: 9 mg/dL (ref 8.9–10.3)
Chloride: 104 mmol/L (ref 98–111)
Creatinine, Ser: 0.95 mg/dL (ref 0.44–1.00)
GFR calc Af Amer: >60 mL/min (ref >60)
GFR calc non Af Amer: >60 mL/min (ref >60)
Glucose, Bld: 75 mg/dL (ref 70–99)
Potassium: 3.9 mmol/L (ref 3.5–5.1)
Sodium: 139 mmol/L (ref 135–145)

## 2020-02-16 LAB — CBC WITH DIFFERENTIAL/PLATELET
Abs Immature Granulocytes: 0.01 10*3/uL (ref 0.00–0.07)
Basophils Absolute: 0 10*3/uL (ref 0.0–0.1)
Basophils Relative: 0 %
Eosinophils Absolute: 0 10*3/uL (ref 0.0–0.5)
Eosinophils Relative: 0 %
HCT: 34.1 % — ABNORMAL LOW (ref 36.0–46.0)
Hemoglobin: 11.2 g/dL — ABNORMAL LOW (ref 12.0–15.0)
Immature Granulocytes: 0 %
Lymphocytes Relative: 48 %
Lymphs Abs: 2.4 10*3/uL (ref 0.7–4.0)
MCH: 30.3 pg (ref 26.0–34.0)
MCHC: 32.8 g/dL (ref 30.0–36.0)
MCV: 92.2 fL (ref 80.0–100.0)
Monocytes Absolute: 0.4 10*3/uL (ref 0.1–1.0)
Monocytes Relative: 7 %
Neutro Abs: 2.3 10*3/uL (ref 1.7–7.7)
Neutrophils Relative %: 45 %
Platelets: 260 10*3/uL (ref 150–400)
RBC: 3.7 MIL/uL — ABNORMAL LOW (ref 3.87–5.11)
RDW: 12.4 % (ref 11.5–15.5)
WBC: 5.1 10*3/uL (ref 4.0–10.5)
nRBC: 0 % (ref 0.0–0.2)

## 2020-02-17 LAB — SARS CORONAVIRUS 2 (TAT 6-24 HRS): SARS Coronavirus 2: NEGATIVE

## 2020-02-19 ENCOUNTER — Encounter (HOSPITAL_COMMUNITY): Payer: Self-pay | Admitting: Internal Medicine

## 2020-02-19 ENCOUNTER — Encounter (HOSPITAL_COMMUNITY)
Admission: RE | Disposition: A | Discharge status: Home / Self Care | Payer: Self-pay | Source: Home / Self Care | Attending: Internal Medicine

## 2020-02-19 ENCOUNTER — Ambulatory Visit (HOSPITAL_COMMUNITY): Payer: Medicare Other | Admitting: Anesthesiology

## 2020-02-19 ENCOUNTER — Ambulatory Visit (HOSPITAL_COMMUNITY)
Admission: RE | Admit: 2020-02-19 | Discharge: 2020-02-19 | Disposition: A | Discharge status: Home / Self Care | Payer: Medicare Other | Attending: Internal Medicine | Admitting: Internal Medicine

## 2020-02-19 DIAGNOSIS — H409 Unspecified glaucoma: Secondary | ICD-10-CM | POA: Insufficient documentation

## 2020-02-19 DIAGNOSIS — Q438 Other specified congenital malformations of intestine: Secondary | ICD-10-CM | POA: Diagnosis not present

## 2020-02-19 DIAGNOSIS — Z791 Long term (current) use of non-steroidal anti-inflammatories (NSAID): Secondary | ICD-10-CM | POA: Diagnosis not present

## 2020-02-19 DIAGNOSIS — Z79899 Other long term (current) drug therapy: Secondary | ICD-10-CM | POA: Diagnosis not present

## 2020-02-19 DIAGNOSIS — M1990 Unspecified osteoarthritis, unspecified site: Secondary | ICD-10-CM | POA: Insufficient documentation

## 2020-02-19 DIAGNOSIS — Z1211 Encounter for screening for malignant neoplasm of colon: Secondary | ICD-10-CM | POA: Diagnosis not present

## 2020-02-19 DIAGNOSIS — K219 Gastro-esophageal reflux disease without esophagitis: Secondary | ICD-10-CM | POA: Diagnosis not present

## 2020-02-19 DIAGNOSIS — Z8719 Personal history of other diseases of the digestive system: Secondary | ICD-10-CM | POA: Diagnosis not present

## 2020-02-19 DIAGNOSIS — E785 Hyperlipidemia, unspecified: Secondary | ICD-10-CM | POA: Diagnosis not present

## 2020-02-19 DIAGNOSIS — Z886 Allergy status to analgesic agent status: Secondary | ICD-10-CM | POA: Insufficient documentation

## 2020-02-19 DIAGNOSIS — Z86010 Personal history of colonic polyps: Secondary | ICD-10-CM | POA: Insufficient documentation

## 2020-02-19 HISTORY — PX: COLONOSCOPY WITH PROPOFOL: SHX5780

## 2020-02-19 SURGERY — COLONOSCOPY WITH PROPOFOL
Anesthesia: General

## 2020-02-19 MED ORDER — KETAMINE HCL 50 MG/5ML IJ SOSY
PREFILLED_SYRINGE | INTRAMUSCULAR | Status: AC
  Filled 2020-02-19: qty 5

## 2020-02-19 MED ORDER — PROPOFOL 500 MG/50ML IV EMUL
INTRAVENOUS | Status: DC | PRN
  Administered 2020-02-19: 40 mg via INTRAVENOUS
  Administered 2020-02-19: 10 mg via INTRAVENOUS

## 2020-02-19 MED ORDER — LACTATED RINGERS IV SOLN
INTRAVENOUS | Status: DC | PRN
Start: 2020-02-19 — End: 2020-02-19

## 2020-02-19 MED ORDER — LACTATED RINGERS IV SOLN
Freq: Once | INTRAVENOUS | Status: AC
  Administered 2020-02-19: 1000 mL via INTRAVENOUS

## 2020-02-19 MED ORDER — CHLORHEXIDINE GLUCONATE CLOTH 2 % EX PADS: 6.0000 | MEDICATED_PAD | Freq: Once | CUTANEOUS | Status: DC

## 2020-02-19 MED ORDER — PROPOFOL 10 MG/ML IV BOLUS
INTRAVENOUS | Status: DC | PRN
  Administered 2020-02-19: 120 ug/kg/min via INTRAVENOUS

## 2020-02-19 MED ORDER — KETAMINE HCL 10 MG/ML IJ SOLN
INTRAMUSCULAR | Status: DC | PRN
Start: 2020-02-19 — End: 2020-02-19
  Administered 2020-02-19 (×2): 10 mg via INTRAVENOUS

## 2020-02-19 NOTE — Discharge Instructions (Signed)
Colonoscopy Discharge Instructions  Read the instructions outlined below and refer to this sheet in the next few weeks. These discharge instructions provide you with general information on caring for yourself after you leave the hospital. Your doctor may also give you specific instructions. While your treatment has been planned according to the most current medical practices available, unavoidable complications occasionally occur. If you have any problems or questions after discharge, call Dr. Gala Romney at [phone]. ACTIVITY  You may resume your regular activity, but move at a slower pace for the next 24 hours.   Take frequent rest periods for the next 24 hours.   Walking will help get rid of the air and reduce the bloated feeling in your belly (abdomen).   No driving for 24 hours (because of the medicine (anesthesia) used during the test).    Do not sign any important legal documents or operate any machinery for 24 hours (because of the anesthesia used during the test).  NUTRITION  Drink plenty of fluids.   You may resume your normal diet as instructed by your doctor.   Begin with a light meal and progress to your normal diet. Heavy or fried foods are harder to digest and may make you feel sick to your stomach (nauseated).   Avoid alcoholic beverages for 24 hours or as instructed.  MEDICATIONS  You may resume your normal medications unless your doctor tells you otherwise.  WHAT YOU CAN EXPECT TODAY  Some feelings of bloating in the abdomen.   Passage of more gas than usual.   Spotting of blood in your stool or on the toilet paper.  IF YOU HAD POLYPS REMOVED DURING THE COLONOSCOPY:  No aspirin products for 7 days or as instructed.   No alcohol for 7 days or as instructed.   Eat a soft diet for the next 24 hours.  FINDING OUT THE RESULTS OF YOUR TEST Not all test results are available during your visit. If your test results are not back during the visit, make an appointment  with your caregiver to find out the results. Do not assume everything is normal if you have not heard from your caregiver or the medical facility. It is important for you to follow up on all of your test results.  SEEK IMMEDIATE MEDICAL ATTENTION IF:  You have more than a spotting of blood in your stool.   Your belly is swollen (abdominal distention).   You are nauseated or vomiting.   You have a temperature over 101.   You have abdominal pain or discomfort that is severe or gets worse throughout the day.   Your colonoscopy was normal today  I do not recommend a future colonoscopy unless new symptoms develop  At patient request, I called Harriette Ohara at [phone] -rolled to voicemail.  "Mailbox full"  PATIENT INSTRUCTIONS POST-ANESTHESIA  IMMEDIATELY FOLLOWING SURGERY:  Do not drive or operate machinery for the first twenty four hours after surgery.  Do not make any important decisions for twenty four hours after surgery or while taking narcotic pain medications or sedatives.  If you develop intractable nausea and vomiting or a severe headache please notify your doctor immediately.  FOLLOW-UP:  Please make an appointment with your surgeon as instructed. You do not need to follow up with anesthesia unless specifically instructed to do so.  WOUND CARE INSTRUCTIONS (if applicable):  Keep a dry clean dressing on the anesthesia/puncture wound site if there is drainage.  Once the wound has quit draining you may  leave it open to air.  Generally you should leave the bandage intact for twenty four hours unless there is drainage.  If the epidural site drains for more than 36-48 hours please call the anesthesia department.  QUESTIONS?:  Please feel free to call your physician or the hospital operator if you have any questions, and they will be happy to assist you.

## 2020-02-19 NOTE — Anesthesia Postprocedure Evaluation (Signed)
Anesthesia Post Note  Patient: Kristi Barnes  Procedure(s) Performed: COLONOSCOPY WITH PROPOFOL (N/A )  Patient location during evaluation: PACU Anesthesia Type: General and MAC Level of consciousness: awake and alert Pain management: pain level controlled Vital Signs Assessment: post-procedure vital signs reviewed and stable Respiratory status: spontaneous breathing Cardiovascular status: stable Postop Assessment: no apparent nausea or vomiting Anesthetic complications: no   No complications documented.   Last Vitals:  Vitals:   02/19/20 1144 02/19/20 1457  BP: (!) 149/68 (!) 122/51  Pulse:  72  Resp: 19 19  Temp: 36.8 C (P) 36.8 C  SpO2: 100%     Last Pain:  Vitals:   02/19/20 1425  TempSrc:   PainSc: 0-No pain                 Everette Rank

## 2020-02-19 NOTE — Anesthesia Preprocedure Evaluation (Signed)
Anesthesia Evaluation  Patient identified by MRN, date of birth, ID band Patient awake    Reviewed: Allergy & Precautions, NPO status , Patient's Chart, lab work & pertinent test results  Airway Mallampati: II  TM Distance: >3 FB Neck ROM: Full    Dental no notable dental hx. (+) Teeth Intact, Dental Advisory Given Fillings :   Pulmonary neg pulmonary ROS,    breath sounds clear to auscultation       Cardiovascular Exercise Tolerance: Good  Rhythm:Regular Rate:Normal     Neuro/Psych negative neurological ROS  negative psych ROS   GI/Hepatic GERD  Medicated and Controlled,  Endo/Other    Renal/GU      Musculoskeletal  (+) Arthritis , Osteoarthritis,    Abdominal   Peds  Hematology   Anesthesia Other Findings   Reproductive/Obstetrics                            Anesthesia Physical  Anesthesia Plan  ASA: II  Anesthesia Plan: General   Post-op Pain Management:    Induction: Intravenous  PONV Risk Score and Plan: 0 and TIVA  Airway Management Planned: Nasal Cannula, Natural Airway and Simple Face Mask  Additional Equipment:   Intra-op Plan:   Post-operative Plan:   Informed Consent: I have reviewed the patients History and Physical, chart, labs and discussed the procedure including the risks, benefits and alternatives for the proposed anesthesia with the patient or authorized representative who has indicated his/her understanding and acceptance.     Dental advisory given  Plan Discussed with: CRNA and Surgeon  Anesthesia Plan Comments:        Anesthesia Quick Evaluation

## 2020-02-19 NOTE — H&P (Signed)
@LOGO @   Primary Care Physician:  Terald Sleeper, PA-C Primary Gastroenterologist:  Dr. Gala Romney  Pre-Procedure History & Physical: HPI:  Kristi Barnes is a 69 y.o. female is here for a screening colonoscopy.  Previous attempt last year was thwarted by poor prep.  Patient states she did well with prep at this time.  Hyperplastic polyps removed in the past.  Due for screening examination at this time.   Past Medical History:  Diagnosis Date  . Arthritis   . GERD (gastroesophageal reflux disease)   . Glaucoma   . Hyperlipidemia   . Vitamin D deficiency     Past Surgical History:  Procedure Laterality Date  . bunions Bilateral   . COLONOSCOPY  01/2013   Dr. Sherrlyn Hock: Fentanyl 150 mcg/Versed 6 mg, sessile polyp removed from the cecum, tortuous rectosigmoid colon.  Pathology revealed focal hyperplastic change, lymphoid aggregate of second fragment with colonic tissue.  Advised to have another colonoscopy in 5 to 10 years.  . COLONOSCOPY WITH PROPOFOL N/A 08/26/2019   Dr. Oneida Alar: INCOMPLETE DUE TO INADEQUATE BOWEL PREP. stool throughout the colon. hemorrhoids noted.    Prior to Admission medications   Medication Sig Start Date End Date Taking? Authorizing Provider  diclofenac sodium (VOLTAREN) 1 % GEL Apply 4 g topically 4 (four) times daily. Patient taking differently: Apply 4 g topically 4 (four) times daily as needed (pain.).  05/12/19  Yes Terald Sleeper, PA-C  Iron-Vitamins (GERITOL) LIQD Take 15 mLs by mouth daily.   Yes [provider]  omeprazole (PRILOSEC) 20 MG capsule Take 1 capsule (20 mg total) by mouth daily. 05/12/19  Yes Terald Sleeper, PA-C  polyethylene glycol-electrolytes (TRILYTE) 420 g solution Take 4,000 mLs by mouth as directed. 11/26/19  Yes Jacson Rapaport, Cristopher Estimable, MD  simvastatin (ZOCOR) 40 MG tablet Take 1 tablet (40 mg total) by mouth daily. 11/07/19  Yes Terald Sleeper, PA-C  traMADol (ULTRAM) 50 MG tablet Take 1 tablet (50 mg total) by mouth every 6 (six)  hours as needed. for pain 11/07/19  Yes Particia Nearing S, PA-C  Vitamin D, Ergocalciferol, (DRISDOL) 1.25 MG (50000 UNIT) CAPS capsule TAKE ONE CAPSULE BY MOUTH EVERY 7 DAYS. 09/29/19  Yes Terald Sleeper, PA-C  vitamin E (VITAMIN E) 400 UNIT capsule Take 400 Units by mouth daily.    Yes [provider]    Allergies as of 11/26/2019 - Review Complete 11/26/2019  Allergen Reaction Noted  . Asa [aspirin] Other (See Comments) 09/22/2016    Family History  Problem Relation Age of Onset  . Heart attack Mother   . Cancer Father        lung  . Leukemia Sister   . Cancer Brother        back of neck  . Cancer Sister        back of neck  . Colon cancer Neg Hx     Social History   Socioeconomic History  . Marital status: Divorced    Spouse name: Not on file  . Number of children: 2  . Years of education: 72  . Highest education level: High school graduate  Occupational History  . Occupation: retired  Tobacco Use  . Smoking status: Never Smoker  . Smokeless tobacco: Never Used  Vaping Use  . Vaping Use: Never used  Substance and Sexual Activity  . Alcohol use: No  . Drug use: No  . Sexual activity: Not Currently  Other Topics Concern  . Not on  file  Social History Narrative  . Not on file   Social Determinants of Health   Financial Resource Strain: Low Risk   . Difficulty of Paying Living Expenses: Not very hard  Food Insecurity: No Food Insecurity  . Worried About Programme researcher, broadcasting/film/video in the Last Year: Never true  . Ran Out of Food in the Last Year: Never true  Transportation Needs: No Transportation Needs  . Lack of Transportation (Medical): No  . Lack of Transportation (Non-Medical): No  Physical Activity: Sufficiently Active  . Days of Exercise per Week: 7 days  . Minutes of Exercise per Session: 30 min  Stress: No Stress Concern Present  . Feeling of Stress : Only a little  Social Connections: Moderately Integrated  . Frequency of Communication with Friends  and Family: More than three times a week  . Frequency of Social Gatherings with Friends and Family: More than three times a week  . Attends Religious Services: More than 4 times per year  . Active Member of Clubs or Organizations: Yes  . Attends Banker Meetings: More than 4 times per year  . Marital Status: Divorced  Catering manager Violence: Not At Risk  . Fear of Current or Ex-Partner: No  . Emotionally Abused: No  . Physically Abused: No  . Sexually Abused: No    Review of Systems: See HPI, otherwise negative ROS  Physical Exam: BP (!) 149/68   Temp 98.2 F (36.8 C) (Oral)   Resp 19   SpO2 100%  General:   Alert,  Well-developed, well-nourished, pleasant and cooperative in NAD Neck:  Supple; no masses or thyromegaly. Lungs:  Clear throughout to auscultation.   No wheezes, crackles, or rhonchi. No acute distress. Heart:  Regular rate and rhythm; no murmurs, clicks, rubs,  or gallops. Abdomen:  Soft, nontender and nondistended. No masses, hepatosplenomegaly or hernias noted. Normal bowel sounds, without guarding, and without rebound.   Impression/Plan: Kristi Barnes is now here to undergo a screening colonoscopy.  Average rescreening examination.  Risks, benefits, limitations, imponderables and alternatives regarding colonoscopy have been reviewed with the patient. Questions have been answered. All parties agreeable.     Notice:  This dictation was prepared with Dragon dictation along with smaller phrase technology. Any transcriptional errors that result from this process are unintentional and may not be corrected upon review.

## 2020-02-19 NOTE — Transfer of Care (Signed)
Immediate Anesthesia Transfer of Care Note  Patient: Kristi Barnes  Procedure(s) Performed: COLONOSCOPY WITH PROPOFOL (N/A )  Patient Location: PACU  Anesthesia Type:MAC and General  Level of Consciousness: awake, alert , oriented and patient cooperative  Airway & Oxygen Therapy: Patient Spontanous Breathing  Post-op Assessment: Report given to RN and Post -op Vital signs reviewed and stable  Post vital signs: Reviewed and stable  Last Vitals:  Vitals Value Taken Time  BP    Temp    Pulse 72 02/19/20 1458  Resp    SpO2 100 % 02/19/20 1458  Vitals shown include unvalidated device data.  Last Pain:  Vitals:   02/19/20 1425  TempSrc:   PainSc: 0-No pain      Patients Stated Pain Goal: 8 (16/38/46 6599)  Complications: No complications documented.

## 2020-02-19 NOTE — Op Note (Signed)
[facility] Patient Name: Kristi Barnes Procedure Date: 02/19/2020 2:24 PM MRN: [id number] Date of Birth: 05/04/1951 Attending MD: Gennette Pac , MD CSN: [id number] Age: 69 Admit Type: Outpatient Procedure:                Colonoscopy Indications:              Screening for colorectal malignant neoplasm Providers:                Gennette Pac, MD, Crystal Page, Edrick Kins, RN, Edythe Clarity, Technician Referring MD:              Medicines:                Propofol per Anesthesia Complications:            No immediate complications. Estimated Blood Loss:     Estimated blood loss: none. Procedure:                Pre-Anesthesia Assessment:                           - Prior to the procedure, a History and Physical                            was performed, and patient medications and                            allergies were reviewed. The patient's tolerance of                            previous anesthesia was also reviewed. The risks                            and benefits of the procedure and the sedation                            options and risks were discussed with the patient.                            All questions were answered, and informed consent                            was obtained. Prior Anticoagulants: The patient has                            taken no previous anticoagulant or antiplatelet                            agents. ASA Grade Assessment: III - A patient with                            severe systemic disease. After reviewing the risks  and benefits, the patient was deemed in                            satisfactory condition to undergo the procedure.                           After obtaining informed consent, the colonoscope                            was passed under direct vision. Throughout the                            procedure, the patient's blood pressure, pulse, and                             oxygen saturations were monitored continuously. The                            CF-[id number] ([id number]) scope was introduced through                            the anus and advanced to the the cecum, identified                            by appendiceal orifice and ileocecal valve. The                            colonoscopy was performed without difficulty. The                            patient tolerated the procedure well. The quality                            of the bowel preparation was adequate. Scope In: 2:31:11 PM Scope Out: 2:48:16 PM Scope Withdrawal Time: 0 hours 10 minutes 48 seconds  Total Procedure Duration: 0 hours 17 minutes 5 seconds  Findings:      The perianal and digital rectal examinations were normal.      The colon (entire examined portion) appeared normal. Initially, patient       had quite a bit of granular, liquid stool throughout the colon. It       required copious washing and lavage to gain an adequate view. The colon       was redundant. Impression:               - The entire examined colon is normal.                           - No specimens collected. Redundant colon. Moderate Sedation:      Moderate (conscious) sedation was personally administered by an       anesthesia professional. The following parameters were monitored: oxygen       saturation, heart rate, blood pressure, respiratory rate, EKG, adequacy       of pulmonary ventilation, and response to care. Recommendation:           -  Patient has a contact number available for                            emergencies. The signs and symptoms of potential                            delayed complications were discussed with the                            patient. Return to normal activities tomorrow.                            Written discharge instructions were provided to the                            patient.                           - Resume previous diet.                           - Continue  present medications.                           - No repeat colonoscopy due to current age (46                            years or older).                           - Return to GI clinic PRN. Procedure Code(s):        --- Professional ---                           [phone], Colonoscopy, flexible; diagnostic, including                            collection of specimen(s) by brushing or washing,                            when performed (separate procedure) Diagnosis Code(s):        --- Professional ---                           Z12.11, Encounter for screening for malignant                            neoplasm of colon CPT copyright 2019 American Medical Association. All rights reserved. The codes documented in this report are preliminary and upon coder review may  be revised to meet current compliance requirements. [name], MD Gennette Pac, MD 02/19/2020 2:53:34 PM This report has been signed electronically. Number of Addenda: 0

## 2020-02-25 ENCOUNTER — Encounter (HOSPITAL_COMMUNITY): Payer: Self-pay | Admitting: Internal Medicine

## 2020-05-07 ENCOUNTER — Other Ambulatory Visit: Payer: Self-pay

## 2020-05-07 ENCOUNTER — Ambulatory Visit (INDEPENDENT_AMBULATORY_CARE_PROVIDER_SITE_OTHER): Payer: Medicare Other | Admitting: Family

## 2020-05-07 ENCOUNTER — Encounter: Payer: Self-pay | Admitting: Family

## 2020-05-07 VITALS — BP 140/75 | HR 87 | Temp 97.7°F | Wt 161.6 lb

## 2020-05-07 DIAGNOSIS — K219 Gastro-esophageal reflux disease without esophagitis: Secondary | ICD-10-CM

## 2020-05-07 DIAGNOSIS — E7800 Pure hypercholesterolemia, unspecified: Secondary | ICD-10-CM

## 2020-05-07 DIAGNOSIS — Z79899 Other long term (current) drug therapy: Secondary | ICD-10-CM | POA: Insufficient documentation

## 2020-05-07 DIAGNOSIS — R5383 Other fatigue: Secondary | ICD-10-CM

## 2020-05-07 DIAGNOSIS — E559 Vitamin D deficiency, unspecified: Secondary | ICD-10-CM

## 2020-05-07 DIAGNOSIS — Z1159 Encounter for screening for other viral diseases: Secondary | ICD-10-CM

## 2020-05-07 DIAGNOSIS — M545 Low back pain: Secondary | ICD-10-CM

## 2020-05-07 DIAGNOSIS — M170 Bilateral primary osteoarthritis of knee: Secondary | ICD-10-CM

## 2020-05-07 DIAGNOSIS — G8929 Other chronic pain: Secondary | ICD-10-CM

## 2020-05-07 MED ORDER — TRAMADOL HCL 50 MG PO TABS
50.0000 mg | ORAL_TABLET | Freq: Four times a day (QID) | ORAL | 2 refills | Status: DC | PRN
Start: 2020-05-07 — End: 2020-08-10

## 2020-05-07 NOTE — Progress Notes (Signed)
Subjective:    Patient ID: Kristi Barnes, female    DOB: 02/21/1951, 69 y.o.   MRN: [id number]  Chief Complaint  Patient presents with  . Medical Management of Chronic Issues  . Fatigue    feels like she is lacking energy.   Pt presents to the office today for to establish care with me. She is complaining of fatigue. She is caring for her grandbaby that is a 38 old. She is very anxious about this.  Gastroesophageal Reflux She complains of belching and heartburn. This is a chronic problem. The current episode started more than 1 year ago. The problem occurs occasionally. The problem has been waxing and waning. She has tried a PPI for the symptoms. The treatment provided moderate relief.  Arthritis Presents for follow-up visit. She complains of pain and stiffness. The symptoms have been stable. Affected locations include the left knee, right knee, left MCP and right MCP. Her pain is at a severity of 7/10.  Hyperlipidemia This is a chronic problem. The current episode started more than 1 year ago. The problem is uncontrolled. Recent lipid tests were reviewed and are high. Current antihyperlipidemic treatment includes statins. The current treatment provides moderate improvement of lipids. Risk factors for coronary artery disease include a sedentary lifestyle, post-menopausal and dyslipidemia.      Review of Systems  Gastrointestinal: Positive for heartburn.  Musculoskeletal: Positive for arthritis and stiffness.  All other systems reviewed and are negative.      Objective:   Physical Exam Vitals reviewed.  Constitutional:      General: She is not in acute distress.    Appearance: She is well-developed.  HENT:     Head: Normocephalic and atraumatic.     Right Ear: Tympanic membrane normal.     Left Ear: Tympanic membrane normal.  Eyes:     Pupils: Pupils are equal, round, and reactive to light.  Neck:     Thyroid: No thyromegaly.  Cardiovascular:     Rate and Rhythm: Normal  rate and regular rhythm.     Heart sounds: Normal heart sounds. No murmur heard.   Pulmonary:     Effort: Pulmonary effort is normal. No respiratory distress.     Breath sounds: Normal breath sounds. No wheezing.  Abdominal:     General: Bowel sounds are normal. There is no distension.     Palpations: Abdomen is soft.     Tenderness: There is no abdominal tenderness.  Musculoskeletal:        General: No tenderness. Normal range of motion.     Cervical back: Normal range of motion and neck supple.  Skin:    General: Skin is warm and dry.  Neurological:     Mental Status: She is alert and oriented to person, place, and time.     Cranial Nerves: No cranial nerve deficit.     Deep Tendon Reflexes: Reflexes are normal and symmetric.  Psychiatric:        Behavior: Behavior normal.        Thought Content: Thought content normal.        Judgment: Judgment normal.       BP 140/75   Pulse 87   Temp 97.7 F (36.5 C) (Temporal)   Wt 161 lb 9.6 oz (73.3 kg)   SpO2 100%   BMI 29.56 kg/m      Assessment & Plan:  Kristi Barnes [name] and Fatigue (  feels like she is lacking energy.)   Diagnosis and orders addressed:  1. Gastroesophageal reflux disease without esophagitis - CMP14+EGFR  2. Primary osteoarthritis of both knees - CMP14+EGFR - traMADol (ULTRAM) 50 MG tablet; Take 1 tablet (50 mg total) by mouth every 6 (six) hours as needed. for pain  Dispense: 120 tablet; Refill: 2  3. Pure hypercholesterolemia - CMP14+EGFR - Lipid panel  4. Vitamin D deficiency - CMP14+EGFR  5. Controlled substance agreement signed - CMP14+EGFR - ToxASSURE Select 13 (MW), Urine  6. Fatigue, unspecified type - CMP14+EGFR - TSH - Anemia Profile B  7. Chronic midline low back pain without sciatica - CMP14+EGFR - traMADol (ULTRAM) 50 MG tablet; Take 1 tablet (50 mg total) by mouth every 6 (six) hours as needed. for pain   Dispense: 120 tablet; Refill: 2 - ToxASSURE Select 13 (MW), Urine  8. Need for hepatitis C screening test - CMP14+EGFR - Hepatitis C antibody   Labs pending Patient reviewed in [place] controlled database, no flags noted. Contract and drug screen are up dated today. Health Maintenance reviewed Diet and exercise encouraged  Follow up plan: 3 months    Evelina Dun, FNP

## 2020-05-07 NOTE — Patient Instructions (Signed)

## 2020-05-08 LAB — ANEMIA PROFILE B
Basophils Absolute: 0 10*3/uL (ref 0.0–0.2)
Basos: 0 %
EOS (ABSOLUTE): 0 10*3/uL (ref 0.0–0.4)
Eos: 0 %
Ferritin: 454 ng/mL — ABNORMAL HIGH (ref 15–150)
Folate: 11.1 ng/mL (ref >3.0)
Hematocrit: 33.5 % — ABNORMAL LOW (ref 34.0–46.6)
Hemoglobin: 11.2 g/dL (ref 11.1–15.9)
Immature Grans (Abs): 0 10*3/uL (ref 0.0–0.1)
Immature Granulocytes: 1 %
Iron Saturation: 10 % — ABNORMAL LOW (ref 15–55)
Iron: 24 ug/dL — ABNORMAL LOW (ref 27–139)
Lymphocytes Absolute: 1.3 10*3/uL (ref 0.7–3.1)
Lymphs: 30 %
MCH: 29.6 pg (ref 26.6–33.0)
MCHC: 33.4 g/dL (ref 31.5–35.7)
MCV: 89 fL (ref 79–97)
Monocytes Absolute: 0.3 10*3/uL (ref 0.1–0.9)
Monocytes: 7 %
Neutrophils Absolute: 2.7 10*3/uL (ref 1.4–7.0)
Neutrophils: 62 %
Platelets: 220 10*3/uL (ref 150–450)
RBC: 3.78 x10E6/uL (ref 3.77–5.28)
RDW: 12.6 % (ref 11.7–15.4)
Retic Ct Pct: 0.7 % (ref 0.6–2.6)
Total Iron Binding Capacity: 244 ug/dL — ABNORMAL LOW (ref 250–450)
UIBC: 220 ug/dL (ref 118–369)
Vitamin B-12: 439 pg/mL (ref 232–1245)
WBC: 4.3 10*3/uL (ref 3.4–10.8)

## 2020-05-08 LAB — CMP14+EGFR
ALT: 18 IU/L (ref 0–32)
AST: 25 IU/L (ref 0–40)
Albumin/Globulin Ratio: 1.6 (ref 1.2–2.2)
Albumin: 4.6 g/dL (ref 3.8–4.8)
Alkaline Phosphatase: 70 IU/L (ref 48–121)
BUN/Creatinine Ratio: 7 — ABNORMAL LOW (ref 12–28)
BUN: 8 mg/dL (ref 8–27)
Bilirubin Total: <0.2 mg/dL (ref 0.0–1.2)
CO2: 25 mmol/L (ref 20–29)
Calcium: 8.8 mg/dL (ref 8.7–10.3)
Chloride: 99 mmol/L (ref 96–106)
Creatinine, Ser: 1.07 mg/dL — ABNORMAL HIGH (ref 0.57–1.00)
GFR calc Af Amer: 62 mL/min/{1.73_m2} (ref >59)
GFR calc non Af Amer: 54 mL/min/{1.73_m2} — ABNORMAL LOW (ref >59)
Globulin, Total: 2.8 g/dL (ref 1.5–4.5)
Glucose: 90 mg/dL (ref 65–99)
Potassium: 3.6 mmol/L (ref 3.5–5.2)
Sodium: 138 mmol/L (ref 134–144)
Total Protein: 7.4 g/dL (ref 6.0–8.5)

## 2020-05-08 LAB — HEPATITIS C ANTIBODY: Hep C Virus Ab: 0.1 s/co ratio (ref 0.0–0.9)

## 2020-05-08 LAB — LIPID PANEL
Chol/HDL Ratio: 3.2 ratio (ref 0.0–4.4)
Cholesterol, Total: 144 mg/dL (ref 100–199)
HDL: 45 mg/dL (ref >39)
LDL Chol Calc (NIH): 83 mg/dL (ref 0–99)
Triglycerides: 81 mg/dL (ref 0–149)
VLDL Cholesterol Cal: 16 mg/dL (ref 5–40)

## 2020-05-08 LAB — TSH: TSH: 1.07 u[IU]/mL (ref 0.450–4.500)

## 2020-05-09 LAB — TOXASSURE SELECT 13 (MW), URINE

## 2020-05-10 ENCOUNTER — Ambulatory Visit: Payer: Medicare Other | Admitting: Family

## 2020-05-10 ENCOUNTER — Ambulatory Visit: Payer: Medicare Other | Admitting: Physician Assistant

## 2020-05-11 ENCOUNTER — Telehealth: Payer: Self-pay | Admitting: Family

## 2020-05-11 NOTE — Telephone Encounter (Signed)
Aware of lab results  

## 2020-05-31 ENCOUNTER — Telehealth: Payer: Self-pay

## 2020-05-31 NOTE — Telephone Encounter (Signed)
Unsure why she received a letter from [facility]. I would recommend calling the number on the letter to follow up.

## 2020-05-31 NOTE — Telephone Encounter (Signed)
Patient states that she received a letter from [facility] from a Dr. Randon Goldsmith. Nurse unaware of what letter she received.  Patient would also like to let PCP know that she started back on Geritol she is feeling much better

## 2020-05-31 NOTE — Telephone Encounter (Signed)
Patient aware.

## 2020-06-04 ENCOUNTER — Other Ambulatory Visit: Payer: Self-pay | Admitting: *Deleted

## 2020-06-04 DIAGNOSIS — K219 Gastro-esophageal reflux disease without esophagitis: Secondary | ICD-10-CM

## 2020-06-04 MED ORDER — OMEPRAZOLE 20 MG PO CPDR: 20.0000 mg | DELAYED_RELEASE_CAPSULE | Freq: Every day | ORAL | 1 refills | Status: DC

## 2020-06-10 ENCOUNTER — Telehealth: Payer: Self-pay | Admitting: *Deleted

## 2020-06-10 MED ORDER — DICLOFENAC SODIUM 1 % EX GEL: 2.0000 g | Freq: Four times a day (QID) | CUTANEOUS | 2 refills | Status: DC

## 2020-06-10 NOTE — Addendum Note (Signed)
Addended by: Jannifer Rodney A on: 06/10/2020 11:00 AM   Modules accepted: Orders

## 2020-06-10 NOTE — Telephone Encounter (Signed)
Fax from [facility] RF request for Diclofenac sodium 1% gel Last OV 05/07/20 Last RF 05/12/19 next OV 08/06/20 Reorder states to replace med

## 2020-06-10 NOTE — Telephone Encounter (Signed)
Prescription sent to pharmacy.

## 2020-06-18 ENCOUNTER — Encounter: Payer: Self-pay | Admitting: *Deleted

## 2020-07-02 ENCOUNTER — Telehealth: Payer: Self-pay | Admitting: Family

## 2020-07-02 DIAGNOSIS — D509 Iron deficiency anemia, unspecified: Secondary | ICD-10-CM

## 2020-07-02 NOTE — Telephone Encounter (Signed)
Patient aware and verbalizes understanding. 

## 2020-07-02 NOTE — Telephone Encounter (Signed)
Pt is wondering if there is anything else that she can take for iron, she is anemic and is unable to find the medicine that she has been used to taking

## 2020-07-02 NOTE — Telephone Encounter (Signed)
Referral to Hematologists. She could do iron infusion.

## 2020-07-29 ENCOUNTER — Other Ambulatory Visit: Payer: Self-pay

## 2020-07-29 ENCOUNTER — Encounter (HOSPITAL_COMMUNITY): Payer: Self-pay | Admitting: Hematology

## 2020-07-29 ENCOUNTER — Inpatient Hospital Stay (HOSPITAL_COMMUNITY): Payer: Medicare Other

## 2020-07-29 ENCOUNTER — Inpatient Hospital Stay (HOSPITAL_COMMUNITY): Payer: Medicare Other | Attending: Hematology | Admitting: Hematology

## 2020-07-29 DIAGNOSIS — Z801 Family history of malignant neoplasm of trachea, bronchus and lung: Secondary | ICD-10-CM | POA: Insufficient documentation

## 2020-07-29 DIAGNOSIS — M2520 Flail joint, unspecified joint: Secondary | ICD-10-CM

## 2020-07-29 DIAGNOSIS — M791 Myalgia, unspecified site: Secondary | ICD-10-CM | POA: Diagnosis not present

## 2020-07-29 DIAGNOSIS — M2550 Pain in unspecified joint: Secondary | ICD-10-CM | POA: Insufficient documentation

## 2020-07-29 DIAGNOSIS — Z8719 Personal history of other diseases of the digestive system: Secondary | ICD-10-CM | POA: Diagnosis not present

## 2020-07-29 DIAGNOSIS — E559 Vitamin D deficiency, unspecified: Secondary | ICD-10-CM | POA: Diagnosis not present

## 2020-07-29 DIAGNOSIS — D509 Iron deficiency anemia, unspecified: Secondary | ICD-10-CM

## 2020-07-29 DIAGNOSIS — Z79899 Other long term (current) drug therapy: Secondary | ICD-10-CM | POA: Insufficient documentation

## 2020-07-29 LAB — VITAMIN B12: Vitamin B-12: 281 pg/mL (ref 180–914)

## 2020-07-29 LAB — COMPREHENSIVE METABOLIC PANEL
ALT: 15 U/L (ref 0–44)
AST: 18 U/L (ref 15–41)
Albumin: 4.4 g/dL (ref 3.5–5.0)
Alkaline Phosphatase: 54 U/L (ref 38–126)
Anion gap: 9 (ref 5–15)
BUN: 11 mg/dL (ref 8–23)
CO2: 26 mmol/L (ref 22–32)
Calcium: 9.3 mg/dL (ref 8.9–10.3)
Chloride: 103 mmol/L (ref 98–111)
Creatinine, Ser: 0.86 mg/dL (ref 0.44–1.00)
GFR, Estimated: >60 mL/min (ref >60)
Glucose, Bld: 86 mg/dL (ref 70–99)
Potassium: 4.2 mmol/L (ref 3.5–5.1)
Sodium: 138 mmol/L (ref 135–145)
Total Bilirubin: 0.4 mg/dL (ref 0.3–1.2)
Total Protein: 7.9 g/dL (ref 6.5–8.1)

## 2020-07-29 LAB — CBC WITH DIFFERENTIAL/PLATELET
Abs Immature Granulocytes: 0.01 10*3/uL (ref 0.00–0.07)
Basophils Absolute: 0 10*3/uL (ref 0.0–0.1)
Basophils Relative: 0 %
Eosinophils Absolute: 0 10*3/uL (ref 0.0–0.5)
Eosinophils Relative: 1 %
HCT: 35.5 % — ABNORMAL LOW (ref 36.0–46.0)
Hemoglobin: 11.1 g/dL — ABNORMAL LOW (ref 12.0–15.0)
Immature Granulocytes: 0 %
Lymphocytes Relative: 44 %
Lymphs Abs: 2.6 10*3/uL (ref 0.7–4.0)
MCH: 29.3 pg (ref 26.0–34.0)
MCHC: 31.3 g/dL (ref 30.0–36.0)
MCV: 93.7 fL (ref 80.0–100.0)
Monocytes Absolute: 0.4 10*3/uL (ref 0.1–1.0)
Monocytes Relative: 7 %
Neutro Abs: 2.8 10*3/uL (ref 1.7–7.7)
Neutrophils Relative %: 48 %
Platelets: 281 10*3/uL (ref 150–400)
RBC: 3.79 MIL/uL — ABNORMAL LOW (ref 3.87–5.11)
RDW: 13.2 % (ref 11.5–15.5)
WBC: 5.7 10*3/uL (ref 4.0–10.5)
nRBC: 0 % (ref 0.0–0.2)

## 2020-07-29 LAB — FERRITIN: Ferritin: 127 ng/mL (ref 11–307)

## 2020-07-29 LAB — FOLATE: Folate: 7.5 ng/mL (ref >5.9)

## 2020-07-29 LAB — LACTATE DEHYDROGENASE: LDH: 135 U/L (ref 98–192)

## 2020-07-29 NOTE — Progress Notes (Signed)
CONSULT NOTE  Patient Care Team: Sharion Balloon, FNP as PCP - General (Family Medicine) [name], MD (Inactive) as Consulting Physician (Gastroenterology)  CHIEF COMPLAINTS/PURPOSE OF CONSULTATION: Anemia  HISTORY OF PRESENTING ILLNESS:  Kristi Barnes 69 y.o. female is here because of anemia.  Lab work from 05/07/2020 showed iron saturations of 10%, ferritin 454 hemoglobin of 11.3 with MCV of 89.  Vitamin B12 level was 439 and folate level 11.1.  Kristi Barnes is a 69 year old female with past medical history significant for GERD, osteoarthritis of both knees, hypercholesterolemia, vitamin D deficiency and history of colonic polyps.   She denies any recent chest pain or shortness of breath on exertion, presyncopal episodes or palpitations.  She has not noticed any recent bleeding such as epistasis, hematuria or hematochezia.  She denies any over-the-counter NSAID ingestion.  She is not on antiplatelet agents.  Most recent colonoscopy is from 02/19/2020 revealing no worrisome findings.  She has no prior history of a diagnosis of cancer.  Her age-appropriate screening programs are up-to-date.  She denies any pica and eats a variety of foods in her diet.  She has never donated blood or received a blood transfusion. On Geritrol iron viatmins.  Denies any history of cancer but does have family history of leukemia (sister) multiple brothers and father with lung cancer.   She was evaluated at [facility] on 02/18/2020 for constipation.  She was given magnesium citrate with resolution.  Today, she reports feeling relatively well. Reports paresthesias especially in her hands and feeling weak and dizzy when her "blood levels are low".  She has been Geritol iron vitamins since she was 69 years old.  Patient states she was born anemic and requires supplementation.  Admits to intermittent use of her iron supplements.  Unable to take iron tablets secondary to constipation and elevating her cholesterol  and blood sugar.  She reports pain in bilateral knees, right ankle, spine and shoulders.  She takes tramadol and uses Voltaren cream with significant improvement.    MEDICAL HISTORY:  Past Medical History:  Diagnosis Date  . Arthritis   . GERD (gastroesophageal reflux disease)   . Glaucoma   . Hyperlipidemia   . Vitamin D deficiency     SURGICAL HISTORY: Past Surgical History:  Procedure Laterality Date  . bunions Bilateral   . COLONOSCOPY  01/2013   Dr. Sherrlyn Hock: Fentanyl 150 mcg/Versed 6 mg, sessile polyp removed from the cecum, tortuous rectosigmoid colon.  Pathology revealed focal hyperplastic change, lymphoid aggregate of second fragment with colonic tissue.  Advised to have another colonoscopy in 5 to 10 years.  . COLONOSCOPY WITH PROPOFOL N/A 08/26/2019   Dr. Oneida Alar: INCOMPLETE DUE TO INADEQUATE BOWEL PREP. stool throughout the colon. hemorrhoids noted.  . COLONOSCOPY WITH PROPOFOL N/A 02/19/2020   Procedure: COLONOSCOPY WITH PROPOFOL;  Surgeon: Daneil Dolin, MD;  Location: [facility];  Service: Endoscopy;  Laterality: N/A;  2:30pm    SOCIAL HISTORY: Social History   Socioeconomic History  . Marital status: Divorced    Spouse name: Not on file  . Number of children: 2  . Years of education: 47  . Highest education level: High school graduate  Occupational History  . Occupation: retired  Tobacco Use  . Smoking status: Never Smoker  . Smokeless tobacco: Never Used  Vaping Use  . Vaping Use: Never used  Substance and Sexual Activity  . Alcohol use: No  . Drug use: No  . Sexual activity: Not Currently  Other Topics Concern  . Not on file  Social History Narrative  . Not on file   Social Determinants of Health   Financial Resource Strain: Low Risk   . Difficulty of Paying Living Expenses: Not very hard  Food Insecurity: No Food Insecurity  . Worried About Charity fundraiser in the Last Year: Never true  . Ran Out of Food in the Last Year: Never  true  Transportation Needs: No Transportation Needs  . Lack of Transportation (Medical): No  . Lack of Transportation (Non-Medical): No  Physical Activity: Sufficiently Active  . Days of Exercise per Week: 7 days  . Minutes of Exercise per Session: 30 min  Stress: No Stress Concern Present  . Feeling of Stress : Only a little  Social Connections: Moderately Integrated  . Frequency of Communication with Friends and Family: More than three times a week  . Frequency of Social Gatherings with Friends and Family: More than three times a week  . Attends Religious Services: More than 4 times per year  . Active Member of Clubs or Organizations: Yes  . Attends Archivist Meetings: More than 4 times per year  . Marital Status: Divorced  Human resources officer Violence: Not At Risk  . Fear of Current or Ex-Partner: No  . Emotionally Abused: No  . Physically Abused: No  . Sexually Abused: No    FAMILY HISTORY: Family History  Problem Relation Age of Onset  . Heart attack Mother   . Cancer Father        lung  . Leukemia Sister   . Cancer Brother        back of neck  . Cancer Sister        back of neck  . Colon cancer Neg Hx     ALLERGIES:  is allergic to asa [aspirin].  MEDICATIONS:  Current Outpatient Medications  Medication Sig Dispense Refill  . diclofenac Sodium (VOLTAREN) 1 % GEL Apply 2 g topically 4 (four) times daily. 150 g 2  . Iron-Vitamins (GERITOL) LIQD Take 15 mLs by mouth daily.    Marland Kitchen latanoprost (XALATAN) 0.005 % ophthalmic solution 1 drop at bedtime.    Marland Kitchen omeprazole (PRILOSEC) 20 MG capsule Take 1 capsule (20 mg total) by mouth daily. 90 capsule 1  . simvastatin (ZOCOR) 40 MG tablet Take 1 tablet (40 mg total) by mouth daily. 90 tablet 3  . Vitamin D, Ergocalciferol, (DRISDOL) 1.25 MG (50000 UNIT) CAPS capsule TAKE ONE CAPSULE BY MOUTH EVERY 7 DAYS. 12 capsule 3  . vitamin E (VITAMIN E) 400 UNIT capsule Take 400 Units by mouth daily.     . traMADol (ULTRAM) 50  MG tablet Take 1 tablet (50 mg total) by mouth every 6 (six) hours as needed. for pain (Patient not taking: Reported on 07/29/2020) 120 tablet 2   No current facility-administered medications for this visit.    REVIEW OF SYSTEMS:   Review of Systems  Constitutional: Positive for malaise/fatigue.  Gastrointestinal: Positive for constipation.  Musculoskeletal: Positive for back pain, joint pain and myalgias.  Neurological: Positive for dizziness.    PHYSICAL EXAMINATION:  ECOG PERFORMANCE STATUS: 1 - Symptomatic but completely ambulatory  There were no vitals filed for this visit. There were no vitals filed for this visit.  Physical Exam Constitutional:      Appearance: Normal appearance.  HENT:     Head: Normocephalic and atraumatic.  Eyes:     Pupils: Pupils are equal, round, and reactive to light.  Cardiovascular:     Rate and Rhythm: Normal rate and regular rhythm.     Heart sounds: Normal heart sounds. No murmur heard.   Pulmonary:     Effort: Pulmonary effort is normal.     Breath sounds: Normal breath sounds. No wheezing.  Abdominal:     General: Bowel sounds are normal. There is no distension.     Palpations: Abdomen is soft.     Tenderness: There is no abdominal tenderness.  Musculoskeletal:        General: Normal range of motion.     Cervical back: Normal range of motion.  Skin:    General: Skin is warm and dry.     Findings: No rash.  Neurological:     Mental Status: She is alert and oriented to person, place, and time.  Psychiatric:        Judgment: Judgment normal.     LABORATORY DATA:  I have reviewed the data as listed Recent Results (from the past 2160 hour(s))  CMP14+EGFR     Status: Abnormal   Collection Time: 05/07/20 11:11 AM  Result Value Ref Range   Glucose 90 65 - 99 mg/dL   BUN 8 8 - 27 mg/dL   Creatinine, Ser 1.07 (H) 0.57 - 1.00 mg/dL   GFR calc non Af Amer 54 (L) >59 mL/min/1.73   GFR calc Af Amer 62 >59 mL/min/1.73    Comment:  **[facility] currently reports eGFR in compliance with the current**   recommendations of the [employer]. [facility] will   update reporting as new guidelines are published from the NKF-ASN   Task force.    BUN/Creatinine Ratio 7 (L) 12 - 28   Sodium 138 134 - 144 mmol/L   Potassium 3.6 3.5 - 5.2 mmol/L   Chloride 99 96 - 106 mmol/L   CO2 25 20 - 29 mmol/L   Calcium 8.8 8.7 - 10.3 mg/dL   Total Protein 7.4 6.0 - 8.5 g/dL   Albumin 4.6 3.8 - 4.8 g/dL   Globulin, Total 2.8 1.5 - 4.5 g/dL   Albumin/Globulin Ratio 1.6 1.2 - 2.2   Bilirubin Total <0.2 0.0 - 1.2 mg/dL   Alkaline Phosphatase 70 48 - 121 IU/L    Comment: **Effective May 10, 2020 Alkaline Phosphatase**   reference interval will be changing to:              Age                Female          Female           0 -  5 days         2 - 127       62 - 127           6 - 10 days         29 - 242       47 - 68          11 - 20 days        109 - 357      109 - 357          21 - 30 days         74 - 353       61 - 494           1 -  2 months      149 - 65  149 - 539           3 -  6 months      131 - 452      131 - 452           7 - 11 months      117 - 401      117 - 401   12 months -  6 years       158 - 369      158 - 369           7 - 12 years       150 - 409      150 - 409               13 years       156 - 435       42 - 227               14 years       114 - 375       47 - 161               15 years        53 - 279       16 - 134               16 years        53 - 207       69 - 121               17 years        19 - 161       37 - 113          88 - 20 years        73 - 125       53 - 106              >20 years         44 - 121       44 - 121    AST 25 0 - 40 IU/L   ALT 18 0 - 32 IU/L  Lipid panel     Status: None   Collection Time: 05/07/20 11:11 AM  Result Value Ref Range   Cholesterol, Total 144 100 - 199 mg/dL   Triglycerides 81 0 - 149 mg/dL   HDL 45 >39 mg/dL   VLDL Cholesterol Cal 16 5 - 40  mg/dL   LDL Chol Calc (NIH) 83 0 - 99 mg/dL   Chol/HDL Ratio 3.2 0.0 - 4.4 ratio    Comment:                                   T. Chol/HDL Ratio                                             Men  Women                               1/2 Avg.Risk  3.4    3.3  Avg.Risk  5.0    4.4                                2X Avg.Risk  9.6    7.1                                3X Avg.Risk 23.4   11.0   TSH     Status: None   Collection Time: 05/07/20 11:11 AM  Result Value Ref Range   TSH 1.070 0.450 - 4.500 uIU/mL  Anemia Profile B     Status: Abnormal   Collection Time: 05/07/20 11:11 AM  Result Value Ref Range   Total Iron Binding Capacity 244 (L) 250 - 450 ug/dL   UIBC 220 118 - 369 ug/dL   Iron 24 (L) 27 - 139 ug/dL   Iron Saturation 10 (L) 15 - 55 %   Ferritin 454 (H) 15.0 - 150.0 ng/mL   Vitamin B-12 439 232 - 1,245 pg/mL   Folate 11.1 >3.0 ng/mL    Comment: A serum folate concentration of less than 3.1 ng/mL is considered to represent clinical deficiency.    WBC 4.3 3.4 - 10.8 x10E3/uL   RBC 3.78 3.77 - 5.28 x10E6/uL   Hemoglobin 11.2 11.1 - 15.9 g/dL   Hematocrit 33.5 (L) 34.0 - 46.6 %   MCV 89 79 - 97 fL   MCH 29.6 26.6 - 33.0 pg   MCHC 33.4 31 - 35 g/dL   RDW 12.6 11.7 - 15.4 %   Platelets 220 150 - 450 x10E3/uL   Neutrophils 62 Not Estab. %    Comment: Occasional metamyelocyte seen on scan.   Lymphs 30 Not Estab. %   Monocytes 7 Not Estab. %   Eos 0 Not Estab. %   Basos 0 Not Estab. %   Neutrophils Absolute 2.7 1.40 - 7.00 x10E3/uL   Lymphocytes Absolute 1.3 0 - 3 x10E3/uL   Monocytes Absolute 0.3 0 - 0 x10E3/uL   EOS (ABSOLUTE) 0.0 0.0 - 0.4 x10E3/uL   Basophils Absolute 0.0 0 - 0 x10E3/uL   Immature Granulocytes 1 Not Estab. %   Immature Grans (Abs) 0.0 0.0 - 0.1 x10E3/uL   Hematology Comments: Note:     Comment: Verified by microscopic examination.   Retic Ct Pct 0.7 0.6 - 2.6 %  Hepatitis C antibody     Status: None   Collection  Time: 05/07/20 11:11 AM  Result Value Ref Range   Hep C Virus Ab 0.1 0.0 - 0.9 s/co ratio    Comment:                                   Negative:     < 0.8                              Indeterminate: 0.8 - 0.9                                   Positive:     > 0.9  The CDC recommends that a positive HCV antibody result  be followed up with a HCV Nucleic Acid Amplification  test (616073).  ToxASSURE Select 13 (MW), Urine     Status: None   Collection Time: 05/07/20 11:30 AM  Result Value Ref Range   Summary Note     Comment: ==================================================================== ToxASSURE Select 13 (MW) ==================================================================== Test                             Result       Flag       Units  Drug Present and Declared for Prescription Verification   Tramadol                       >2551        EXPECTED   ng/mg creat   O-Desmethyltramadol            >2551        EXPECTED   ng/mg creat   N-Desmethyltramadol            814          EXPECTED   ng/mg creat    Source of tramadol is a prescription medication. O-desmethyltramadol    and N-desmethyltramadol are expected metabolites of tramadol.  ==================================================================== Test                      Result    Flag   Units      Ref Range   Creatinine              196              mg/dL      >=20 ==================================================================== Declared Medications:  The flagging and interpretation on this report are based on the  follo wing declared medications.  Unexpected results may arise from  inaccuracies in the declared medications.   **Note: The testing scope of this panel includes these medications:   Tramadol (Ultram) ==================================================================== For clinical consultation, please call [phone]. ====================================================================    Folate     Status: None   Collection Time: 07/29/20 12:53 PM  Result Value Ref Range   Folate 7.5 >5.9 ng/mL    Comment: Performed at [facility], [address] [place], [place] [address]  Lactate dehydrogenase     Status: None   Collection Time: 07/29/20 12:53 PM  Result Value Ref Range   LDH 135 98 - 192 U/L    Comment: Performed at [facility], [address] [place], [place] [address]  Vitamin B12     Status: None   Collection Time: 07/29/20 12:53 PM  Result Value Ref Range   Vitamin B-12 281 180 - 914 pg/mL    Comment: (NOTE) This assay is not validated for testing neonatal or myeloproliferative syndrome specimens for Vitamin B12 levels. Performed at [facility], [address], [place], [place] [address]   Ferritin     Status: None   Collection Time: 07/29/20 12:53 PM  Result Value Ref Range   Ferritin 127 11 - 307 ng/mL    Comment: Performed at [facility], [address], [place], [place] [address]  CBC with Differential     Status: Abnormal   Collection Time: 07/29/20 12:53 PM  Result Value Ref Range   WBC 5.7 4.0 - 10.5 K/uL   RBC 3.79 (L) 3.87 - 5.11 MIL/uL   Hemoglobin 11.1 (L) 12.0 - 15.0 g/dL   HCT 35.5 (L) 36 - 46 %   MCV 93.7 80.0 - 100.0 fL   MCH 29.3  26.0 - 34.0 pg   MCHC 31.3 30.0 - 36.0 g/dL   RDW 13.2 11.5 - 15.5 %   Platelets 281 150 - 400 K/uL   nRBC 0.0 0.0 - 0.2 %   Neutrophils Relative % 48 %   Neutro Abs 2.8 1.7 - 7.7 K/uL   Lymphocytes Relative 44 %   Lymphs Abs 2.6 0.7 - 4.0 K/uL   Monocytes Relative 7 %   Monocytes Absolute 0.4 0.1 - 1.0 K/uL   Eosinophils Relative 1 %   Eosinophils Absolute 0.0 0.0 - 0.5 K/uL   Basophils Relative 0 %   Basophils Absolute 0.0 0.0 - 0.1 K/uL   Immature Granulocytes 0 %   Abs Immature Granulocytes 0.01 0.00 - 0.07 K/uL    Comment: Performed at [facility], [address] [place], [place] [address]  Comprehensive metabolic panel     Status: None   Collection Time: 07/29/20 12:53 PM  Result Value Ref  Range   Sodium 138 135 - 145 mmol/L   Potassium 4.2 3.5 - 5.1 mmol/L   Chloride 103 98 - 111 mmol/L   CO2 26 22 - 32 mmol/L   Glucose, Bld 86 70 - 99 mg/dL    Comment: Glucose reference range applies only to samples taken after fasting for at least 8 hours.   BUN 11 8 - 23 mg/dL   Creatinine, Ser 0.86 0.44 - 1.00 mg/dL   Calcium 9.3 8.9 - 10.3 mg/dL   Total Protein 7.9 6.5 - 8.1 g/dL   Albumin 4.4 3.5 - 5.0 g/dL   AST 18 15 - 41 U/L   ALT 15 0 - 44 U/L   Alkaline Phosphatase 54 38 - 126 U/L   Total Bilirubin 0.4 0.3 - 1.2 mg/dL   GFR, Estimated >60 >60 mL/min    Comment: (NOTE) Calculated using the CKD-EPI Creatinine Equation (2021)    Anion gap 9 5 - 15    Comment: Performed at [facility], [address], [place], [place] [address]    RADIOGRAPHIC STUDIES: I have personally reviewed the radiological images as listed and agreed with the findings in the report. No results found.  ASSESSMENT & PLAN:  Iron deficiency anemia: -Chronic problem since birth per patient. -Ferritin elevated at 454, iron saturations 10%.  -Hemoglobin intermittently low ranging from 11.2-normal; normal MCV -Colonoscopy from 01/30/2020 showed no worrisome findings. -No pica  Joint pain: -Has osteoarthritis in bilateral lower extremities, right ankle spine and left shoulder. -On tramadol uses Voltaren with relief.  Social history: -Non-smoker. -Family history of several passed away from lung cancer.  Her sister has leukemia at the age of 71. -No personal history of cancer.  Plan: Iron deficiency anemia: -Lab work today.  -Rule out vitamin deficiencies.  Will collect repeat CBC, CMP, vitamin B12, MMA, copper, folate, ferritin and iron panel, SPEP given age, history and fluctuation kidney function.  -We will also add on CRP and sedimentation rate given joint pain and elevated ferritin.  -If repeat iron levels are low can offer IV Feraheme x2 given 1 week apart.  Disposition: -RTC in 2 to 3 weeks  to review lab work.  Addendum: I have independently evaluated this patient and agree with HPI written by my nurse practitioner Faythe Casa, NP. She is being evaluated for iron deficiency state. She apparently takes Geritol twice daily since age 25. She is not able to get [facility] and is very worried about it. She is apparently spreading it out to last longer. Most recent  hemoglobin was 11.2. We plan to check for nutritional deficiencies. If there is worsening hemoglobin and ferritin levels, will consider parenteral iron therapy as she cannot tolerate any other forms of iron.   All questions were answered. The patient knows to call the clinic with any problems, questions or concerns.      Derek Jack, MD 07/29/20 7:54 PM

## 2020-08-01 LAB — COPPER, SERUM: Copper: 105 ug/dL (ref 80–158)

## 2020-08-02 LAB — MULTIPLE MYELOMA PANEL, SERUM
Albumin SerPl Elph-Mcnc: 4 g/dL (ref 2.9–4.4)
Albumin/Glob SerPl: 1.2 (ref 0.7–1.7)
Alpha 1: 0.2 g/dL (ref 0.0–0.4)
Alpha2 Glob SerPl Elph-Mcnc: 0.8 g/dL (ref 0.4–1.0)
B-Globulin SerPl Elph-Mcnc: 1.2 g/dL (ref 0.7–1.3)
Gamma Glob SerPl Elph-Mcnc: 1.3 g/dL (ref 0.4–1.8)
Globulin, Total: 3.5 g/dL (ref 2.2–3.9)
IgA: 302 mg/dL (ref 87–352)
IgG (Immunoglobin G), Serum: 1268 mg/dL (ref 586–1602)
IgM (Immunoglobulin M), Srm: 69 mg/dL (ref 26–217)
Total Protein ELP: 7.5 g/dL (ref 6.0–8.5)

## 2020-08-04 LAB — METHYLMALONIC ACID, SERUM: Methylmalonic Acid, Quantitative: 162 nmol/L (ref 0–378)

## 2020-08-06 ENCOUNTER — Ambulatory Visit: Payer: Medicare Other | Admitting: Family

## 2020-08-10 ENCOUNTER — Other Ambulatory Visit: Payer: Self-pay

## 2020-08-10 ENCOUNTER — Encounter: Payer: Self-pay | Admitting: Family

## 2020-08-10 ENCOUNTER — Ambulatory Visit (INDEPENDENT_AMBULATORY_CARE_PROVIDER_SITE_OTHER): Payer: Medicare Other

## 2020-08-10 ENCOUNTER — Ambulatory Visit (INDEPENDENT_AMBULATORY_CARE_PROVIDER_SITE_OTHER): Payer: Medicare Other | Admitting: Family

## 2020-08-10 VITALS — BP 144/72 | HR 60 | Temp 97.4°F | Ht 62.0 in | Wt 162.6 lb

## 2020-08-10 DIAGNOSIS — R030 Elevated blood-pressure reading, without diagnosis of hypertension: Secondary | ICD-10-CM

## 2020-08-10 DIAGNOSIS — Z23 Encounter for immunization: Secondary | ICD-10-CM | POA: Diagnosis not present

## 2020-08-10 DIAGNOSIS — K219 Gastro-esophageal reflux disease without esophagitis: Secondary | ICD-10-CM

## 2020-08-10 DIAGNOSIS — G8929 Other chronic pain: Secondary | ICD-10-CM

## 2020-08-10 DIAGNOSIS — M545 Low back pain, unspecified: Secondary | ICD-10-CM | POA: Diagnosis not present

## 2020-08-10 DIAGNOSIS — Z780 Asymptomatic menopausal state: Secondary | ICD-10-CM | POA: Diagnosis not present

## 2020-08-10 DIAGNOSIS — M170 Bilateral primary osteoarthritis of knee: Secondary | ICD-10-CM | POA: Diagnosis not present

## 2020-08-10 DIAGNOSIS — Z79899 Other long term (current) drug therapy: Secondary | ICD-10-CM

## 2020-08-10 DIAGNOSIS — E7800 Pure hypercholesterolemia, unspecified: Secondary | ICD-10-CM

## 2020-08-10 DIAGNOSIS — E559 Vitamin D deficiency, unspecified: Secondary | ICD-10-CM

## 2020-08-10 MED ORDER — OMEPRAZOLE 20 MG PO CPDR: 20.0000 mg | DELAYED_RELEASE_CAPSULE | Freq: Every day | ORAL | 1 refills | Status: DC

## 2020-08-10 MED ORDER — TRAMADOL HCL 50 MG PO TABS: 50.0000 mg | ORAL_TABLET | Freq: Four times a day (QID) | ORAL | 2 refills | Status: DC | PRN

## 2020-08-10 MED ORDER — SIMVASTATIN 40 MG PO TABS: 40.0000 mg | ORAL_TABLET | Freq: Every day | ORAL | 3 refills | Status: DC

## 2020-08-10 NOTE — Patient Instructions (Signed)

## 2020-08-10 NOTE — Progress Notes (Signed)
Subjective:    Patient ID: Kristi Barnes, female    DOB: 1951-08-16, 69 y.o.   MRN: [id number]  Chief Complaint  Patient presents with  . Medical Management of Chronic Issues  . [name]  . Hip Pain    Left pain runs down leg and goes numb.  She states Voltaren does help   Pt presents to the office today for chronic follow up.  Hip Pain  The incident occurred more than 1 week ago.  Gastroesophageal Reflux She complains of belching and heartburn. This is a chronic problem. The current episode started more than 1 year ago. The problem occurs occasionally. The problem has been waxing and waning. Risk factors include obesity. She has tried a PPI for the symptoms. The treatment provided moderate relief.  Arthritis Presents for follow-up visit. She complains of pain and stiffness. The symptoms have been stable. Affected locations include the left hip and right hip. Her pain is at a severity of 7/10.  Hyperlipidemia This is a chronic problem. The current episode started more than 1 year ago. The problem is controlled. Recent lipid tests were reviewed and are normal. Exacerbating diseases include obesity. Current antihyperlipidemic treatment includes statins. The current treatment provides moderate improvement of lipids. Risk factors for coronary artery disease include dyslipidemia, hypertension, a sedentary lifestyle and post-menopausal.      Review of Systems  Gastrointestinal: Positive for heartburn.  Musculoskeletal: Positive for arthritis and stiffness.  All other systems reviewed and are negative.      Objective:   Physical Exam Vitals reviewed.  Constitutional:      General: She is not in acute distress.    Appearance: She is well-developed and well-nourished.  HENT:     Head: Normocephalic and atraumatic.     Right Ear: Tympanic membrane normal.     Left Ear: Tympanic membrane normal.     Mouth/Throat:     Mouth: Oropharynx is clear and moist.  Eyes:     Pupils: Pupils  are equal, round, and reactive to light.  Neck:     Thyroid: No thyromegaly.  Cardiovascular:     Rate and Rhythm: Normal rate and regular rhythm.     Pulses: Intact distal pulses.     Heart sounds: Normal heart sounds. No murmur heard.   Pulmonary:     Effort: Pulmonary effort is normal. No respiratory distress.     Breath sounds: Normal breath sounds. No wheezing.  Abdominal:     General: Bowel sounds are normal. There is no distension.     Palpations: Abdomen is soft.     Tenderness: There is no abdominal tenderness.  Musculoskeletal:        General: No tenderness or edema. Normal range of motion.     Cervical back: Normal range of motion and neck supple.  Skin:    General: Skin is warm and dry.  Neurological:     Mental Status: She is alert and oriented to person, place, and time.     Cranial Nerves: No cranial nerve deficit.     Deep Tendon Reflexes: Reflexes are normal and symmetric.  Psychiatric:        Mood and Affect: Mood and affect normal.        Behavior: Behavior normal.        Thought Content: Thought content normal.        Judgment: Judgment normal.     BP (!) 168/85   Pulse 73   Temp (!) 97.4 F (36.3  C) (Temporal)   Ht 5\' 2"  (1.575 m)   Wt 162 lb 9.6 oz (73.8 kg)   BMI 29.74 kg/m      Assessment & Plan:  [name], [name], and Hip Pain (Left pain runs down leg and goes numb. [name] states Voltaren does help)   Diagnosis and orders addressed:  1. Primary osteoarthritis of both knees - traMADol (ULTRAM) 50 MG tablet; Take 1 tablet (50 mg total) by mouth every 6 (six) hours as needed. for pain  Dispense: 120 tablet; Refill: 2  2. Chronic midline low back pain without sciatica - traMADol (ULTRAM) 50 MG tablet; Take 1 tablet (50 mg total) by mouth every 6 (six) hours as needed. for pain  Dispense: 120 tablet; Refill: 2  3. Gastroesophageal reflux disease without  esophagitis - omeprazole (PRILOSEC) 20 MG capsule; Take 1 capsule (20 mg total) by mouth daily.  Dispense: 90 capsule; Refill: 1  4. Pure hypercholesterolemia - simvastatin (ZOCOR) 40 MG tablet; Take 1 tablet (40 mg total) by mouth daily.  Dispense: 90 tablet; Refill: 3  5. Controlled substance agreement signed  6. Vitamin D deficiency  7. Post-menopausal  - DG [facility] DEXA   8. Elevated blood pressure reading Will monitor at home Discussed <140/90 If still elevated on next visit may start medication   Continue current meds- low fat diet and exercise and recheck in 3 months Health Maintenance reviewed Diet and exercise encouraged  Follow up plan: 3 months    Francis Dowse, FNP

## 2020-08-13 ENCOUNTER — Other Ambulatory Visit: Payer: Self-pay | Admitting: Family

## 2020-08-13 DIAGNOSIS — M8580 Other specified disorders of bone density and structure, unspecified site: Secondary | ICD-10-CM

## 2020-08-17 NOTE — Progress Notes (Signed)
[place] [address] [place] [address]   CLINIC:  Medical Oncology/Hematology   Patient Care Team: Sharion Balloon, FNP as PCP - General (Family Medicine) [name], MD (Inactive) as Consulting Physician (Gastroenterology)  CHIEF COMPLAINTS/PURPOSE OF CONSULTATION: Anemia  HISTORY OF PRESENTING ILLNESS:  Kristi Barnes is a 69 y.o. female with a diagnosis of an iron deficiency anemia. She presents to the clinic today having most recently been seen in consultation by Dr. Delton Coombes on 07/29/2020.  Her labs collected at that time returned with a copper level of 105, folate 7.5, LDH 135, methylmalonic acid of 162, vitamin B12 of 281, ferritin of 127, serum protein electrophoresis showing no evidence of a monoclonal protein, CBC with hemoglobin of 11.1 and hematocrit of 35.5.  The patient presents to clinic today reporting that she is doing well without issues of concern.  Her energy level is stable.  She denies shortness of breath, chest pain, dizziness, or fatigue.  MEDICAL HISTORY:  Past Medical History:  Diagnosis Date  . Arthritis   . GERD (gastroesophageal reflux disease)   . Glaucoma   . Hyperlipidemia   . Vitamin D deficiency     SURGICAL HISTORY: Past Surgical History:  Procedure Laterality Date  . bunions Bilateral   . COLONOSCOPY  01/2013   Dr. Sherrlyn Hock: Fentanyl 150 mcg/Versed 6 mg, sessile polyp removed from the cecum, tortuous rectosigmoid colon.  Pathology revealed focal hyperplastic change, lymphoid aggregate of second fragment with colonic tissue.  Advised to have another colonoscopy in 5 to 10 years.  . COLONOSCOPY WITH PROPOFOL N/A 08/26/2019   Dr. Oneida Alar: INCOMPLETE DUE TO INADEQUATE BOWEL PREP. stool throughout the colon. hemorrhoids noted.  . COLONOSCOPY WITH PROPOFOL N/A 02/19/2020   Procedure: COLONOSCOPY WITH PROPOFOL;  Surgeon: Daneil Dolin, MD;  Location: [facility];  Service: Endoscopy;  Laterality: N/A;  2:30pm     SOCIAL HISTORY: Social History   Socioeconomic History  . Marital status: Divorced    Spouse name: Not on file  . Number of children: 2  . Years of education: 72  . Highest education level: High school graduate  Occupational History  . Occupation: retired  Tobacco Use  . Smoking status: Never Smoker  . Smokeless tobacco: Never Used  Vaping Use  . Vaping Use: Never used  Substance and Sexual Activity  . Alcohol use: No  . Drug use: No  . Sexual activity: Not Currently  Other Topics Concern  . Not on file  Social History Narrative  . Not on file   Social Determinants of Health   Financial Resource Strain: Low Risk   . Difficulty of Paying Living Expenses: Not very hard  Food Insecurity: No Food Insecurity  . Worried About Charity fundraiser in the Last Year: Never true  . Ran Out of Food in the Last Year: Never true  Transportation Needs: No Transportation Needs  . Lack of Transportation (Medical): No  . Lack of Transportation (Non-Medical): No  Physical Activity: Sufficiently Active  . Days of Exercise per Week: 7 days  . Minutes of Exercise per Session: 30 min  Stress: No Stress Concern Present  . Feeling of Stress : Only a little  Social Connections: Moderately Integrated  . Frequency of Communication with Friends and Family: More than three times a week  . Frequency of Social Gatherings with Friends and Family: More than three times a week  . Attends Religious Services: More than 4 times  per year  . Active Member of Clubs or Organizations: Yes  . Attends Archivist Meetings: More than 4 times per year  . Marital Status: Divorced  Human resources officer Violence: Not At Risk  . Fear of Current or Ex-Partner: No  . Emotionally Abused: No  . Physically Abused: No  . Sexually Abused: No    FAMILY HISTORY: Family History  Problem Relation Age of Onset  . Heart attack Mother   . Cancer Father        lung  . Leukemia Sister   . Cancer Brother         back of neck  . Cancer Sister        back of neck  . Colon cancer Neg Hx     ALLERGIES:  is allergic to asa [aspirin].  MEDICATIONS:  Current Outpatient Medications  Medication Sig Dispense Refill  . diclofenac Sodium (VOLTAREN) 1 % GEL Apply 2 g topically 4 (four) times daily. 150 g 2  . Iron-Vitamins (GERITOL) LIQD Take 15 mLs by mouth daily.    Marland Kitchen latanoprost (XALATAN) 0.005 % ophthalmic solution 1 drop at bedtime.    Marland Kitchen omeprazole (PRILOSEC) 20 MG capsule Take 1 capsule (20 mg total) by mouth daily. 90 capsule 1  . simvastatin (ZOCOR) 40 MG tablet Take 1 tablet (40 mg total) by mouth daily. 90 tablet 3  . traMADol (ULTRAM) 50 MG tablet Take 1 tablet (50 mg total) by mouth every 6 (six) hours as needed. for pain 120 tablet 2  . Vitamin D, Ergocalciferol, (DRISDOL) 1.25 MG (50000 UNIT) CAPS capsule TAKE ONE CAPSULE BY MOUTH EVERY 7 DAYS. 12 capsule 3  . vitamin E 180 MG (400 UNITS) capsule Take 400 Units by mouth daily.      No current facility-administered medications for this visit.    REVIEW OF SYSTEMS:    Review of Systems  Constitutional: Negative for chills, diaphoresis, fever and malaise/fatigue.  HENT: Negative for congestion and sore throat.   Eyes: Negative.   Respiratory: Negative for cough, hemoptysis, sputum production, shortness of breath and wheezing.   Cardiovascular: Negative for chest pain, palpitations and leg swelling.  Gastrointestinal: Negative for abdominal pain, blood in stool, constipation, diarrhea, heartburn, melena, nausea and vomiting.  Genitourinary: Negative.   Musculoskeletal: Negative for back pain, joint pain, myalgias and neck pain.  Skin: Negative.   Neurological: Negative for dizziness, tingling, weakness and headaches.  Endo/Heme/Allergies: Negative.   Psychiatric/Behavioral: Negative.     PHYSICAL EXAMINATION:  ECOG PERFORMANCE STATUS: 1 - Symptomatic but completely ambulatory  There were no vitals filed for this visit. There were no  vitals filed for this visit.   Physical Exam Constitutional:      General: She is not in acute distress.    Appearance: She is not ill-appearing, toxic-appearing or diaphoretic.  HENT:     Head: Normocephalic and atraumatic.  Eyes:     General: No scleral icterus.       Right eye: No discharge.        Left eye: No discharge.     Conjunctiva/sclera: Conjunctivae normal.  Cardiovascular:     Rate and Rhythm: Normal rate and regular rhythm.     Heart sounds: No murmur heard. No friction rub. No gallop.   Pulmonary:     Effort: Pulmonary effort is normal. No respiratory distress.     Breath sounds: Normal breath sounds. No wheezing, rhonchi or rales.  Abdominal:     General: Abdomen is flat. Bowel  sounds are normal. There is no distension.     Palpations: Abdomen is soft.     Tenderness: There is no abdominal tenderness. There is no guarding.  Musculoskeletal:     Right lower leg: No edema.     Left lower leg: No edema.  Skin:    Findings: No erythema, lesion or rash.  Neurological:     Mental Status: She is alert.     Coordination: Coordination normal.     Gait: Gait normal.  Psychiatric:        Mood and Affect: Mood normal.        Behavior: Behavior normal.        Thought Content: Thought content normal.        Judgment: Judgment normal.      LABORATORY DATA:  I have reviewed the data as listed Recent Results (from the past 2160 hour(s))  Folate     Status: None   Collection Time: 07/29/20 12:53 PM  Result Value Ref Range   Folate 7.5 >5.9 ng/mL    Comment: Performed at [facility], [address] [place], [place] [address]  Lactate dehydrogenase     Status: None   Collection Time: 07/29/20 12:53 PM  Result Value Ref Range   LDH 135 98 - 192 U/L    Comment: Performed at [facility], [address] [place], [place] [address]  Methylmalonic acid, serum     Status: None   Collection Time: 07/29/20 12:53 PM  Result Value Ref Range   Methylmalonic Acid,  Quantitative 162 0 - 378 nmol/L   Disclaimer: Comment     Comment: (NOTE) This test was developed and its performance characteristics determined by [facility]. It has not been cleared or approved by the Food and Drug Administration. Performed At: [facility] [place], [place] [id number] Rush Farmer MD [id number]   Vitamin B12     Status: None   Collection Time: 07/29/20 12:53 PM  Result Value Ref Range   Vitamin B-12 281 180 - 914 pg/mL    Comment: (NOTE) This assay is not validated for testing neonatal or myeloproliferative syndrome specimens for Vitamin B12 levels. Performed at [facility], [address] [place], [place] [address]   Ferritin     Status: None   Collection Time: 07/29/20 12:53 PM  Result Value Ref Range   Ferritin 127 11 - 307 ng/mL    Comment: Performed at [facility], [address] [place], [place] [address]  Multiple Myeloma Panel (SPEP&IFE w/QIG)     Status: None   Collection Time: 07/29/20 12:53 PM  Result Value Ref Range   IgG (Immunoglobin G), Serum 1,268 586 - 1,602 mg/dL   IgA 302 87 - 352 mg/dL   IgM (Immunoglobulin M), Srm 69 26 - 217 mg/dL   Total Protein ELP 7.5 6.0 - 8.5 g/dL   Albumin SerPl Elph-Mcnc 4.0 2.9 - 4.4 g/dL   Alpha 1 0.2 0.0 - 0.4 g/dL   Alpha2 Glob SerPl Elph-Mcnc 0.8 0.4 - 1.0 g/dL   B-Globulin SerPl Elph-Mcnc 1.2 0.7 - 1.3 g/dL   Gamma Glob SerPl Elph-Mcnc 1.3 0.4 - 1.8 g/dL   M Protein SerPl Elph-Mcnc Not Observed Not Observed g/dL   Globulin, Total 3.5 2.2 - 3.9 g/dL   Albumin/Glob SerPl 1.2 0.7 - 1.7   IFE 1 Comment     Comment: (NOTE) The immunofixation pattern appears unremarkable. Evidence of monoclonal protein is not apparent.    Please Note Comment  Comment: (NOTE) Protein electrophoresis scan will follow via computer, mail, or courier delivery. Performed At: [facility] [place], [place] [id number] Rush Farmer MD [id number]   CBC with Differential      Status: Abnormal   Collection Time: 07/29/20 12:53 PM  Result Value Ref Range   WBC 5.7 4.0 - 10.5 K/uL   RBC 3.79 (L) 3.87 - 5.11 MIL/uL   Hemoglobin 11.1 (L) 12.0 - 15.0 g/dL   HCT 35.5 (L) 36.0 - 46.0 %   MCV 93.7 80.0 - 100.0 fL   MCH 29.3 26.0 - 34.0 pg   MCHC 31.3 30.0 - 36.0 g/dL   RDW 13.2 11.5 - 15.5 %   Platelets 281 150 - 400 K/uL   nRBC 0.0 0.0 - 0.2 %   Neutrophils Relative % 48 %   Neutro Abs 2.8 1.7 - 7.7 K/uL   Lymphocytes Relative 44 %   Lymphs Abs 2.6 0.7 - 4.0 K/uL   Monocytes Relative 7 %   Monocytes Absolute 0.4 0.1 - 1.0 K/uL   Eosinophils Relative 1 %   Eosinophils Absolute 0.0 0.0 - 0.5 K/uL   Basophils Relative 0 %   Basophils Absolute 0.0 0.0 - 0.1 K/uL   Immature Granulocytes 0 %   Abs Immature Granulocytes 0.01 0.00 - 0.07 K/uL    Comment: Performed at [facility], [address], [place], [place] [address]  Comprehensive metabolic panel     Status: None   Collection Time: 07/29/20 12:53 PM  Result Value Ref Range   Sodium 138 135 - 145 mmol/L   Potassium 4.2 3.5 - 5.1 mmol/L   Chloride 103 98 - 111 mmol/L   CO2 26 22 - 32 mmol/L   Glucose, Bld 86 70 - 99 mg/dL    Comment: Glucose reference range applies only to samples taken after fasting for at least 8 hours.   BUN 11 8 - 23 mg/dL   Creatinine, Ser 0.86 0.44 - 1.00 mg/dL   Calcium 9.3 8.9 - 10.3 mg/dL   Total Protein 7.9 6.5 - 8.1 g/dL   Albumin 4.4 3.5 - 5.0 g/dL   AST 18 15 - 41 U/L   ALT 15 0 - 44 U/L   Alkaline Phosphatase 54 38 - 126 U/L   Total Bilirubin 0.4 0.3 - 1.2 mg/dL   GFR, Estimated >60 >60 mL/min    Comment: (NOTE) Calculated using the CKD-EPI Creatinine Equation (2021)    Anion gap 9 5 - 15    Comment: Performed at [facility], [address], [place], [place] [address]  Copper, serum     Status: None   Collection Time: 07/29/20 12:53 PM  Result Value Ref Range   Copper 105 80 - 158 ug/dL    Comment: (NOTE) This test was developed and its performance  characteristics determined by [facility]. It has not been cleared or approved by the Food and Drug Administration.                                Detection Limit = 5 Performed At: [facility] [address] [place], [place] [id number] Rush Farmer MD [id number]     RADIOGRAPHIC STUDIES: I have personally reviewed the radiological images as listed and agreed with the findings in the report. DG [facility] DEXA  Result Date: 08/13/2020 CLINICAL DATA:  Postmenopausal. EXAM: DUAL X-RAY ABSORPTIOMETRY (DXA) FOR BONE MINERAL DENSITY TECHNIQUE: Bone mineral density measurements  are performed of the spine, hip, and forearm, as appropriate, per International Society of Clinical Densitometry recommendations. The pertinent regions of interest are reported below. Non-contributory values are not reported. Images are obtained for bone mineral density measurement and are not obtained for diagnostic purposes. FINDINGS: AP LUMBAR SPINE L1 through L4 Bone Mineral Density (BMD):  1.352 g/cm2 Young Adult T-Score:  1.3 Z-Score:  1.9 LEFT FEMUR NECK Bone Mineral Density (BMD):  0.848 g/cm2 Young Adult T-Score: -4.4 Z-Score:  -0.9 Unit: This study was performed at [facility] on a [place]. Scan quality: The scan quality is good. Exclusions: None. ASSESSMENT: Patient's diagnostic category is LOW BONE MASS/OSTEOPENIA by [facility] Criteria. FRACTURE RISK: INCREASED FRAX: Based on the Deputy model, the 10 year probability of a major osteoporotic fracture is 4%. The 10 year probability of a hip fracture is 0.4%. COMPARISON: None. RECOMMENDATIONS 1. All patients should optimize calcium and vitamin D intake. 2. Consider FDA-approved medical therapies in postmenopausal women and men aged 62 years and older, based on the following: - A hip or vertebral (clinical or morphometric) fracture - T-score less than or equal to -2.5 at the femoral neck or spine after appropriate  evaluation to exclude secondary causes - Low bone mass (T-score between -1.0 and -2.5 at the femoral neck or spine) and a 10-year probability of a hip fracture greater than or equal to 3% or a 10-year probability of a major osteoporosis-related fracture greater than or equal to 20% based on the [place]-adapted WHO algorithm - Clinician judgment and/or patient preferences may indicate treatment for people with 10-year fracture probabilities above or below these levels 3. Patients with diagnosis of osteoporosis or at high risk for fracture should have regular bone mineral density tests. For patients eligible for Medicare, routine testing is allowed once every 2 years. The testing frequency can be increased to one year for patients who have rapidly progressing disease, those who are receiving or discontinuing medical therapy to restore bone mass, or have additional risk factors. Electronically Signed   By: Lajean Manes M.D.   On: 08/13/2020 09:15    ASSESSMENT & PLAN:  Iron deficiency anemia: -Chronic problem since birth per patient. -Ferritin elevated at 454, iron saturations 10%.  -Hemoglobin intermittently low ranging from 11.2-normal; normal MCV -Colonoscopy from 01/30/2020 showed no worrisome findings. -No pica  Joint pain: -Has osteoarthritis in bilateral lower extremities, right ankle spine and left shoulder. -On tramadol uses Voltaren with relief.  Social history: -Non-smoker. -Family history of several passed away from lung cancer.  Her sister has leukemia at the age of 15. -No personal history of cancer.  Plan: Iron deficiency anemia: -Ms. Frier is asymptomatic and overall is doing well.  Plans are to have her return to see Dr. Delton Coombes on 09/29/2020 with labs 1 week prior to her return.      Harle Stanford, PA-C 08/17/20 2:24 PM

## 2020-08-18 ENCOUNTER — Other Ambulatory Visit: Payer: Self-pay

## 2020-08-18 ENCOUNTER — Inpatient Hospital Stay (HOSPITAL_BASED_OUTPATIENT_CLINIC_OR_DEPARTMENT_OTHER): Payer: Medicare Other | Admitting: Medical

## 2020-08-18 VITALS — BP 185/84 | HR 70 | Temp 96.8°F | Resp 18 | Wt 163.1 lb

## 2020-08-18 DIAGNOSIS — E559 Vitamin D deficiency, unspecified: Secondary | ICD-10-CM

## 2020-08-18 DIAGNOSIS — D649 Anemia, unspecified: Secondary | ICD-10-CM | POA: Diagnosis not present

## 2020-08-18 DIAGNOSIS — D509 Iron deficiency anemia, unspecified: Secondary | ICD-10-CM | POA: Diagnosis not present

## 2020-09-16 ENCOUNTER — Telehealth: Payer: Self-pay

## 2020-09-16 NOTE — Telephone Encounter (Signed)
Reviewed chart - no mention of skin cancer - called and spoke to patient and notified that we did not have this on her chart

## 2020-09-21 ENCOUNTER — Other Ambulatory Visit (HOSPITAL_COMMUNITY): Payer: Self-pay | Admitting: *Deleted

## 2020-09-21 DIAGNOSIS — E559 Vitamin D deficiency, unspecified: Secondary | ICD-10-CM

## 2020-09-21 DIAGNOSIS — D509 Iron deficiency anemia, unspecified: Secondary | ICD-10-CM

## 2020-09-22 ENCOUNTER — Other Ambulatory Visit: Payer: Self-pay

## 2020-09-22 ENCOUNTER — Inpatient Hospital Stay (HOSPITAL_COMMUNITY): Payer: Medicare Other | Attending: Hematology

## 2020-09-22 DIAGNOSIS — D509 Iron deficiency anemia, unspecified: Secondary | ICD-10-CM | POA: Insufficient documentation

## 2020-09-22 DIAGNOSIS — E559 Vitamin D deficiency, unspecified: Secondary | ICD-10-CM

## 2020-09-22 LAB — COMPREHENSIVE METABOLIC PANEL
ALT: 15 U/L (ref 0–44)
AST: 20 U/L (ref 15–41)
Albumin: 4.2 g/dL (ref 3.5–5.0)
Alkaline Phosphatase: 56 U/L (ref 38–126)
Anion gap: 6 (ref 5–15)
BUN: 16 mg/dL (ref 8–23)
CO2: 29 mmol/L (ref 22–32)
Calcium: 9.5 mg/dL (ref 8.9–10.3)
Chloride: 104 mmol/L (ref 98–111)
Creatinine, Ser: 1.04 mg/dL — ABNORMAL HIGH (ref 0.44–1.00)
GFR, Estimated: 59 mL/min — ABNORMAL LOW (ref >60)
Glucose, Bld: 84 mg/dL (ref 70–99)
Potassium: 4.5 mmol/L (ref 3.5–5.1)
Sodium: 139 mmol/L (ref 135–145)
Total Bilirubin: 0.3 mg/dL (ref 0.3–1.2)
Total Protein: 7.6 g/dL (ref 6.5–8.1)

## 2020-09-22 LAB — CBC WITH DIFFERENTIAL/PLATELET
Abs Immature Granulocytes: 0.02 10*3/uL (ref 0.00–0.07)
Basophils Absolute: 0 10*3/uL (ref 0.0–0.1)
Basophils Relative: 0 %
Eosinophils Absolute: 0 10*3/uL (ref 0.0–0.5)
Eosinophils Relative: 1 %
HCT: 36.3 % (ref 36.0–46.0)
Hemoglobin: 11.7 g/dL — ABNORMAL LOW (ref 12.0–15.0)
Immature Granulocytes: 0 %
Lymphocytes Relative: 48 %
Lymphs Abs: 2.3 10*3/uL (ref 0.7–4.0)
MCH: 30.1 pg (ref 26.0–34.0)
MCHC: 32.2 g/dL (ref 30.0–36.0)
MCV: 93.3 fL (ref 80.0–100.0)
Monocytes Absolute: 0.4 10*3/uL (ref 0.1–1.0)
Monocytes Relative: 8 %
Neutro Abs: 2.1 10*3/uL (ref 1.7–7.7)
Neutrophils Relative %: 43 %
Platelets: 232 10*3/uL (ref 150–400)
RBC: 3.89 MIL/uL (ref 3.87–5.11)
RDW: 12.6 % (ref 11.5–15.5)
WBC: 4.8 10*3/uL (ref 4.0–10.5)
nRBC: 0 % (ref 0.0–0.2)

## 2020-09-22 LAB — VITAMIN D 25 HYDROXY (VIT D DEFICIENCY, FRACTURES): Vit D, 25-Hydroxy: 106.75 ng/mL — ABNORMAL HIGH (ref 30–100)

## 2020-09-29 ENCOUNTER — Ambulatory Visit (HOSPITAL_COMMUNITY): Payer: Medicare Other | Admitting: Hematology

## 2020-10-26 ENCOUNTER — Inpatient Hospital Stay (HOSPITAL_COMMUNITY): Payer: Medicare Other

## 2020-10-26 ENCOUNTER — Inpatient Hospital Stay (HOSPITAL_COMMUNITY): Payer: Medicare Other | Attending: Hematology | Admitting: Oncology

## 2020-10-26 ENCOUNTER — Other Ambulatory Visit: Payer: Self-pay

## 2020-10-26 VITALS — HR 83 | Temp 97.0°F | Resp 18 | Wt 162.0 lb

## 2020-10-26 DIAGNOSIS — D509 Iron deficiency anemia, unspecified: Secondary | ICD-10-CM

## 2020-10-26 DIAGNOSIS — E559 Vitamin D deficiency, unspecified: Secondary | ICD-10-CM | POA: Insufficient documentation

## 2020-10-26 LAB — IRON AND TIBC
Iron: 64 ug/dL (ref 28–170)
Saturation Ratios: 21 % (ref 10.4–31.8)
TIBC: 301 ug/dL (ref 250–450)
UIBC: 237 ug/dL

## 2020-10-26 LAB — FERRITIN: Ferritin: 174 ng/mL (ref 11–307)

## 2020-10-26 NOTE — Progress Notes (Addendum)
[place] [address],  [address]   CLINIC:  Medical Oncology/Hematology   Patient Care Team: Sharion Balloon, FNP as PCP - General (Family Medicine) [name], MD (Inactive) as Consulting Physician (Gastroenterology)  CHIEF COMPLAINTS/PURPOSE OF CONSULTATION: Anemia  HISTORY OF PRESENTING ILLNESS:  Kristi Barnes is a 70 y.o. female with a diagnosis of an iron deficiency anemia.  She was last seen in clinic on 08/18/2020.  She is currently on oral iron supplements.  She has been taking Geritol 15 mL iron supplements since she was 70 years old intermittently.  She recently has switched to 325 mg ferrous sulfate 1 tab daily due to discontinuation of Geritol.  She states that she is tolerating ferrous sulfate okay.  She is taking 1 tablet daily.  Denies any constipation. She is worried about her energy and concerned about having dizzy spells should the new iron tablets not work.    MEDICAL HISTORY:  Past Medical History:  Diagnosis Date  . Arthritis   . GERD (gastroesophageal reflux disease)   . Glaucoma   . Hyperlipidemia   . Vitamin D deficiency     SURGICAL HISTORY: Past Surgical History:  Procedure Laterality Date  . bunions Bilateral   . COLONOSCOPY  01/2013   Dr. Sherrlyn Hock: Fentanyl 150 mcg/Versed 6 mg, sessile polyp removed from the cecum, tortuous rectosigmoid colon.  Pathology revealed focal hyperplastic change, lymphoid aggregate of second fragment with colonic tissue.  Advised to have another colonoscopy in 5 to 10 years.  . COLONOSCOPY WITH PROPOFOL N/A 08/26/2019   Dr. Oneida Alar: INCOMPLETE DUE TO INADEQUATE BOWEL PREP. stool throughout the colon. hemorrhoids noted.  . COLONOSCOPY WITH PROPOFOL N/A 02/19/2020   Procedure: COLONOSCOPY WITH PROPOFOL;  Surgeon: Daneil Dolin, MD;  Location: [facility];  Service: Endoscopy;  Laterality: N/A;  2:30pm    SOCIAL HISTORY: Social History   Socioeconomic History  . Marital  status: Divorced    Spouse name: Not on file  . Number of children: 2  . Years of education: 25  . Highest education level: High school graduate  Occupational History  . Occupation: retired  Tobacco Use  . Smoking status: Never Smoker  . Smokeless tobacco: Never Used  Vaping Use  . Vaping Use: Never used  Substance and Sexual Activity  . Alcohol use: No  . Drug use: No  . Sexual activity: Not Currently  Other Topics Concern  . Not on file  Social History Narrative  . Not on file   Social Determinants of Health   Financial Resource Strain: Low Risk   . Difficulty of Paying Living Expenses: Not very hard  Food Insecurity: No Food Insecurity  . Worried About Charity fundraiser in the Last Year: Never true  . Ran Out of Food in the Last Year: Never true  Transportation Needs: No Transportation Needs  . Lack of Transportation (Medical): No  . Lack of Transportation (Non-Medical): No  Physical Activity: Sufficiently Active  . Days of Exercise per Week: 7 days  . Minutes of Exercise per Session: 30 min  Stress: No Stress Concern Present  . Feeling of Stress : Only a little  Social Connections: Moderately Integrated  . Frequency of Communication with Friends and Family: More than three times a week  . Frequency of Social Gatherings with Friends and Family: More than three times a week  . Attends Religious Services: More than 4 times per year  . Active Member  of Clubs or Organizations: Yes  . Attends Archivist Meetings: More than 4 times per year  . Marital Status: Divorced  Human resources officer Violence: Not At Risk  . Fear of Current or Ex-Partner: No  . Emotionally Abused: No  . Physically Abused: No  . Sexually Abused: No    FAMILY HISTORY: Family History  Problem Relation Age of Onset  . Heart attack Mother   . Cancer Father        lung  . Leukemia Sister   . Cancer Brother        back of neck  . Cancer Sister        back of neck  . Colon cancer Neg Hx      ALLERGIES:  is allergic to asa [aspirin].  MEDICATIONS:  Current Outpatient Medications  Medication Sig Dispense Refill  . diclofenac Sodium (VOLTAREN) 1 % GEL Apply 2 g topically 4 (four) times daily. 150 g 2  . Iron-Vitamins (GERITOL) LIQD Take 15 mLs by mouth daily.    Marland Kitchen latanoprost (XALATAN) 0.005 % ophthalmic solution 1 drop at bedtime.    Marland Kitchen omeprazole (PRILOSEC) 20 MG capsule Take 1 capsule (20 mg total) by mouth daily. 90 capsule 1  . simvastatin (ZOCOR) 40 MG tablet Take 1 tablet (40 mg total) by mouth daily. 90 tablet 3  . traMADol (ULTRAM) 50 MG tablet Take 1 tablet (50 mg total) by mouth every 6 (six) hours as needed. for pain 120 tablet 2  . Vitamin D, Ergocalciferol, (DRISDOL) 1.25 MG (50000 UNIT) CAPS capsule TAKE ONE CAPSULE BY MOUTH EVERY 7 DAYS. 12 capsule 3  . vitamin E 180 MG (400 UNITS) capsule Take 400 Units by mouth daily.      No current facility-administered medications for this visit.    REVIEW OF SYSTEMS:    Review of Systems  Constitutional: Negative for chills, diaphoresis, fever and malaise/fatigue.  HENT: Negative for congestion and sore throat.   Eyes: Negative.   Respiratory: Negative for cough, hemoptysis, sputum production, shortness of breath and wheezing.   Cardiovascular: Negative for chest pain, palpitations and leg swelling.  Gastrointestinal: Negative for abdominal pain, blood in stool, constipation, diarrhea, heartburn, melena, nausea and vomiting.  Genitourinary: Negative.   Musculoskeletal: Negative for back pain, joint pain, myalgias and neck pain.  Skin: Negative.   Neurological: Negative for dizziness, tingling, weakness and headaches.  Endo/Heme/Allergies: Negative.   Psychiatric/Behavioral: Negative.     PHYSICAL EXAMINATION:  ECOG PERFORMANCE STATUS: 1 - Symptomatic but completely ambulatory  There were no vitals filed for this visit. There were no vitals filed for this visit.   Physical Exam Constitutional:       General: She is not in acute distress.    Appearance: She is not ill-appearing, toxic-appearing or diaphoretic.  HENT:     Head: Normocephalic and atraumatic.  Eyes:     General: No scleral icterus.       Right eye: No discharge.        Left eye: No discharge.     Conjunctiva/sclera: Conjunctivae normal.  Cardiovascular:     Rate and Rhythm: Normal rate and regular rhythm.     Heart sounds: No murmur heard. No friction rub. No gallop.   Pulmonary:     Effort: Pulmonary effort is normal. No respiratory distress.     Breath sounds: Normal breath sounds. No wheezing, rhonchi or rales.  Abdominal:     General: Abdomen is flat. Bowel sounds are normal. There is no  distension.     Palpations: Abdomen is soft.     Tenderness: There is no abdominal tenderness. There is no guarding.  Musculoskeletal:     Right lower leg: No edema.     Left lower leg: No edema.  Skin:    Findings: No erythema, lesion or rash.  Neurological:     Mental Status: She is alert.     Coordination: Coordination normal.     Gait: Gait normal.  Psychiatric:        Mood and Affect: Mood normal.        Behavior: Behavior normal.        Thought Content: Thought content normal.        Judgment: Judgment normal.      LABORATORY DATA:  I have reviewed the data as listed Recent Results (from the past 2160 hour(s))  Folate     Status: None   Collection Time: 07/29/20 12:53 PM  Result Value Ref Range   Folate 7.5 >5.9 ng/mL    Comment: Performed at [facility], [address] [place], [place] [address]  Lactate dehydrogenase     Status: None   Collection Time: 07/29/20 12:53 PM  Result Value Ref Range   LDH 135 98 - 192 U/L    Comment: Performed at [facility], [address] [place], [place] [address]  Methylmalonic acid, serum     Status: None   Collection Time: 07/29/20 12:53 PM  Result Value Ref Range   Methylmalonic Acid, Quantitative 162 0 - 378 nmol/L   Disclaimer: Comment     Comment:  (NOTE) This test was developed and its performance characteristics determined by [facility]. It has not been cleared or approved by the Food and Drug Administration. Performed At: [facility] [place], [place] [id number] Rush Farmer MD [id number]   Vitamin B12     Status: None   Collection Time: 07/29/20 12:53 PM  Result Value Ref Range   Vitamin B-12 281 180 - 914 pg/mL    Comment: (NOTE) This assay is not validated for testing neonatal or myeloproliferative syndrome specimens for Vitamin B12 levels. Performed at [facility], [address] [place], [place] [address]   Ferritin     Status: None   Collection Time: 07/29/20 12:53 PM  Result Value Ref Range   Ferritin 127 11 - 307 ng/mL    Comment: Performed at [facility], [address] [place], [place] [address]  Multiple Myeloma Panel (SPEP&IFE w/QIG)     Status: None   Collection Time: 07/29/20 12:53 PM  Result Value Ref Range   IgG (Immunoglobin G), Serum 1,268 586 - 1,602 mg/dL   IgA 302 87 - 352 mg/dL   IgM (Immunoglobulin M), Srm 69 26 - 217 mg/dL   Total Protein ELP 7.5 6.0 - 8.5 g/dL   Albumin SerPl Elph-Mcnc 4.0 2.9 - 4.4 g/dL   Alpha 1 0.2 0.0 - 0.4 g/dL   Alpha2 Glob SerPl Elph-Mcnc 0.8 0.4 - 1.0 g/dL   B-Globulin SerPl Elph-Mcnc 1.2 0.7 - 1.3 g/dL   Gamma Glob SerPl Elph-Mcnc 1.3 0.4 - 1.8 g/dL   M Protein SerPl Elph-Mcnc Not Observed Not Observed g/dL   Globulin, Total 3.5 2.2 - 3.9 g/dL   Albumin/Glob SerPl 1.2 0.7 - 1.7   IFE 1 Comment     Comment: (NOTE) The immunofixation pattern appears unremarkable. Evidence of monoclonal protein is not apparent.    Please Note Comment     Comment: (NOTE) Protein  electrophoresis scan will follow via computer, mail, or courier delivery. Performed At: [facility] [place], [place] [id number] Rush Farmer MD [id number]   CBC with Differential     Status: Abnormal   Collection Time: 07/29/20 12:53 PM  Result  Value Ref Range   WBC 5.7 4.0 - 10.5 K/uL   RBC 3.79 (L) 3.87 - 5.11 MIL/uL   Hemoglobin 11.1 (L) 12.0 - 15.0 g/dL   HCT 35.5 (L) 36.0 - 46.0 %   MCV 93.7 80.0 - 100.0 fL   MCH 29.3 26.0 - 34.0 pg   MCHC 31.3 30.0 - 36.0 g/dL   RDW 13.2 11.5 - 15.5 %   Platelets 281 150 - 400 K/uL   nRBC 0.0 0.0 - 0.2 %   Neutrophils Relative % 48 %   Neutro Abs 2.8 1.7 - 7.7 K/uL   Lymphocytes Relative 44 %   Lymphs Abs 2.6 0.7 - 4.0 K/uL   Monocytes Relative 7 %   Monocytes Absolute 0.4 0.1 - 1.0 K/uL   Eosinophils Relative 1 %   Eosinophils Absolute 0.0 0.0 - 0.5 K/uL   Basophils Relative 0 %   Basophils Absolute 0.0 0.0 - 0.1 K/uL   Immature Granulocytes 0 %   Abs Immature Granulocytes 0.01 0.00 - 0.07 K/uL    Comment: Performed at [facility], [address], [place], [place] [address]  Comprehensive metabolic panel     Status: None   Collection Time: 07/29/20 12:53 PM  Result Value Ref Range   Sodium 138 135 - 145 mmol/L   Potassium 4.2 3.5 - 5.1 mmol/L   Chloride 103 98 - 111 mmol/L   CO2 26 22 - 32 mmol/L   Glucose, Bld 86 70 - 99 mg/dL    Comment: Glucose reference range applies only to samples taken after fasting for at least 8 hours.   BUN 11 8 - 23 mg/dL   Creatinine, Ser 0.86 0.44 - 1.00 mg/dL   Calcium 9.3 8.9 - 10.3 mg/dL   Total Protein 7.9 6.5 - 8.1 g/dL   Albumin 4.4 3.5 - 5.0 g/dL   AST 18 15 - 41 U/L   ALT 15 0 - 44 U/L   Alkaline Phosphatase 54 38 - 126 U/L   Total Bilirubin 0.4 0.3 - 1.2 mg/dL   GFR, Estimated >60 >60 mL/min    Comment: (NOTE) Calculated using the CKD-EPI Creatinine Equation (2021)    Anion gap 9 5 - 15    Comment: Performed at [facility], [address] [place], [place] [address]  Copper, serum     Status: None   Collection Time: 07/29/20 12:53 PM  Result Value Ref Range   Copper 105 80 - 158 ug/dL    Comment: (NOTE) This test was developed and its performance characteristics determined by [facility]. It has not been cleared or approved by  the Food and Drug Administration.                                Detection Limit = 5 Performed At: [facility] [place], [place] [id number] Rush Farmer MD [id number]   CBC with Differential/Platelet     Status: Abnormal   Collection Time: 09/22/20 11:06 AM  Result Value Ref Range   WBC 4.8 4.0 - 10.5 K/uL   RBC 3.89 3.87 - 5.11 MIL/uL   Hemoglobin 11.7 (L) 12.0 - 15.0 g/dL   HCT  36.3 36.0 - 46.0 %   MCV 93.3 80.0 - 100.0 fL   MCH 30.1 26.0 - 34.0 pg   MCHC 32.2 30.0 - 36.0 g/dL   RDW 12.6 11.5 - 15.5 %   Platelets 232 150 - 400 K/uL   nRBC 0.0 0.0 - 0.2 %   Neutrophils Relative % 43 %   Neutro Abs 2.1 1.7 - 7.7 K/uL   Lymphocytes Relative 48 %   Lymphs Abs 2.3 0.7 - 4.0 K/uL   Monocytes Relative 8 %   Monocytes Absolute 0.4 0.1 - 1.0 K/uL   Eosinophils Relative 1 %   Eosinophils Absolute 0.0 0.0 - 0.5 K/uL   Basophils Relative 0 %   Basophils Absolute 0.0 0.0 - 0.1 K/uL   Immature Granulocytes 0 %   Abs Immature Granulocytes 0.02 0.00 - 0.07 K/uL    Comment: Performed at [facility], [address], [place], [place] [address]  Comprehensive metabolic panel     Status: Abnormal   Collection Time: 09/22/20 11:06 AM  Result Value Ref Range   Sodium 139 135 - 145 mmol/L   Potassium 4.5 3.5 - 5.1 mmol/L   Chloride 104 98 - 111 mmol/L   CO2 29 22 - 32 mmol/L   Glucose, Bld 84 70 - 99 mg/dL    Comment: Glucose reference range applies only to samples taken after fasting for at least 8 hours.   BUN 16 8 - 23 mg/dL   Creatinine, Ser 1.04 (H) 0.44 - 1.00 mg/dL   Calcium 9.5 8.9 - 10.3 mg/dL   Total Protein 7.6 6.5 - 8.1 g/dL   Albumin 4.2 3.5 - 5.0 g/dL   AST 20 15 - 41 U/L   ALT 15 0 - 44 U/L   Alkaline Phosphatase 56 38 - 126 U/L   Total Bilirubin 0.3 0.3 - 1.2 mg/dL   GFR, Estimated 59 (L) >60 mL/min    Comment: (NOTE) Calculated using the CKD-EPI Creatinine Equation (2021)    Anion gap 6 5 - 15    Comment: Performed at [facility],  [address], [place], [place] [address]  VITAMIN D 25 Hydroxy (Vit-D Deficiency, Fractures)     Status: Abnormal   Collection Time: 09/22/20 11:06 AM  Result Value Ref Range   Vit D, 25-Hydroxy 106.75 (H) 30 - 100 ng/mL    Comment: (NOTE) Vitamin D deficiency has been defined by the [place] practice guideline as a level of serum 25-OH  vitamin D less than 20 ng/mL (1,2). The Endocrine Society went on to  further define vitamin D insufficiency as a level between 21 and 29  ng/mL (2).  1. IOM (Institute of Medicine). 2010. Dietary reference intakes for  calcium and D.  [place]: The [employer]. 2. Holick MF, Binkley McNeil, Bischoff-Ferrari HA, et al. Evaluation,  treatment, and prevention of vitamin D deficiency: an Endocrine  Society clinical practice guideline, JCEM. 2011 Jul; 96(7): 1911-30.  Performed at [facility], [place] [address], [place], [place] [address]     RADIOGRAPHIC STUDIES: I have personally reviewed the radiological images as listed and agreed with the findings in the report. No results found.  ASSESSMENT & PLAN:  Iron deficiency anemia: -Chronic problem since birth per patient. -Ferritin elevated at 454, iron saturations 10%.  -Hemoglobin intermittently low ranging from 11.2-normal; normal MCV -Colonoscopy from 01/30/2020 showed no worrisome findings. -No pica -Work-up included nutritional deficiencies which were essentially negative and SPEP  which was negative.  CRP and sedimentation rate were negative as well.   Joint pain: -Has osteoarthritis in bilateral lower extremities, right ankle spine and left shoulder. -On tramadol uses Voltaren with relief.  Social history: -Non-smoker. -Family history of several passed away from lung cancer.  Her sister has leukemia at the age of 14. -No personal history of cancer.  Vitamin D deficiency: -Labs from 09/22/2020 show a vitamin D level of 106.75. -I have asked  her to stop taking her ergocalciferol 50,000 units weekly.  She was taking this medication daily. -We will recheck at next visit.  Plan: Iron deficiency anemia: -Recently switched from Geritol 15 mL by mouth daily to ferrous sulfate 325 mg daily. Tolerating so far.  -This appears to be holding her hemoglobin.  No iron/ferritin labs drawn today.  -We will get iron labs added on for today to see how they are. -No previous history of IV iron. -Not acutely symptomatic. -Continue supplements.  Disposition: -Add on labs (ferritin, iron)-we will call patient with results. -RTC in 3 months with repeat labs (CBC, iron, ferritin, B12, vitamin D) and MD assessment.  Addendum: -Called and left VM for patient regarding labs- Labs from 10/26/2020 show a ferritin of 174 with a saturation of 21%.  Hemoglobin 11.7.  Overall lab work is stable.  We will continue oral iron tablets at this time.  If her energy level decreases or she is unable to tolerate oral iron, patient to call clinic for IV iron.  Greater than 50% was spent in counseling and coordination of care with this patient including but not limited to discussion of the relevant topics above (See A&P) including, but not limited to diagnosis and management of acute and chronic medical conditions.      Jacquelin Hawking, NP 10/26/20 2:38 PM

## 2020-11-09 ENCOUNTER — Other Ambulatory Visit: Payer: Self-pay

## 2020-11-09 ENCOUNTER — Ambulatory Visit (INDEPENDENT_AMBULATORY_CARE_PROVIDER_SITE_OTHER): Payer: Medicare Other | Admitting: Family

## 2020-11-09 ENCOUNTER — Encounter: Payer: Self-pay | Admitting: Family

## 2020-11-09 VITALS — BP 152/72 | HR 61 | Temp 97.3°F | Ht 62.0 in | Wt 161.2 lb

## 2020-11-09 DIAGNOSIS — M170 Bilateral primary osteoarthritis of knee: Secondary | ICD-10-CM

## 2020-11-09 DIAGNOSIS — E7800 Pure hypercholesterolemia, unspecified: Secondary | ICD-10-CM | POA: Diagnosis not present

## 2020-11-09 DIAGNOSIS — K5900 Constipation, unspecified: Secondary | ICD-10-CM

## 2020-11-09 DIAGNOSIS — G8929 Other chronic pain: Secondary | ICD-10-CM

## 2020-11-09 DIAGNOSIS — M545 Low back pain, unspecified: Secondary | ICD-10-CM

## 2020-11-09 DIAGNOSIS — E559 Vitamin D deficiency, unspecified: Secondary | ICD-10-CM

## 2020-11-09 DIAGNOSIS — Z79899 Other long term (current) drug therapy: Secondary | ICD-10-CM

## 2020-11-09 DIAGNOSIS — K219 Gastro-esophageal reflux disease without esophagitis: Secondary | ICD-10-CM

## 2020-11-09 DIAGNOSIS — I10 Essential (primary) hypertension: Secondary | ICD-10-CM | POA: Diagnosis not present

## 2020-11-09 MED ORDER — AMLODIPINE BESYLATE 5 MG PO TABS: 5.0000 mg | ORAL_TABLET | Freq: Every day | ORAL | 3 refills | Status: DC

## 2020-11-09 MED ORDER — SIMVASTATIN 40 MG PO TABS
40.0000 mg | ORAL_TABLET | Freq: Every day | ORAL | 3 refills | Status: DC
Start: 2020-11-09 — End: 2021-08-24

## 2020-11-09 MED ORDER — TRAMADOL HCL 50 MG PO TABS: 50.0000 mg | ORAL_TABLET | Freq: Four times a day (QID) | ORAL | 2 refills | Status: DC | PRN

## 2020-11-09 NOTE — Progress Notes (Signed)
Subjective:    Patient ID: Kristi Barnes, female    DOB: September 04, 1950, 70 y.o.   MRN: [id number]  Chief Complaint  Patient presents with  . Medical Management of Chronic Issues   PT presents to the office today for chronic follow up. She is followed by Hematologists every 6 months for iron deficiency anemia.  Gastroesophageal Reflux She complains of belching and heartburn. This is a chronic problem. The current episode started in the past 7 days. The problem occurs occasionally. The problem has been waxing and waning. She has tried a PPI for the symptoms. The treatment provided moderate relief.  Arthritis Presents for follow-up visit. She complains of pain and stiffness. The symptoms have been stable. Affected locations include the left knee, right knee, left MCP and right MCP. Her pain is at a severity of 6/10.  Hyperlipidemia This is a chronic problem. The current episode started more than 1 year ago. Exacerbating diseases include obesity. Pertinent negatives include no shortness of breath. Current antihyperlipidemic treatment includes statins. The current treatment provides moderate improvement of lipids. Risk factors for coronary artery disease include dyslipidemia, hypertension and a sedentary lifestyle.  Anemia Presents for follow-up visit. There has been no anorexia, confusion or malaise/fatigue.  Hypertension This is a new problem. The current episode started more than 1 year ago. The problem has been waxing and waning since onset. The problem is uncontrolled. Pertinent negatives include no malaise/fatigue, peripheral edema or shortness of breath.  Constipation This is a chronic problem. The current episode started more than 1 year ago. The problem has been waxing and waning since onset. Her stool frequency is 2 to 3 times per week. Pertinent negatives include no anorexia. The treatment provided mild relief.      Review of Systems  Constitutional: Negative for malaise/fatigue.   Respiratory: Negative for shortness of breath.   Gastrointestinal: Positive for constipation and heartburn. Negative for anorexia.  Musculoskeletal: Positive for arthritis and stiffness.  Psychiatric/Behavioral: Negative for confusion.  All other systems reviewed and are negative.      Objective:   Physical Exam Vitals reviewed.  Constitutional:      General: She is not in acute distress.    Appearance: She is well-developed.  HENT:     Head: Normocephalic and atraumatic.     Right Ear: Tympanic membrane normal.     Left Ear: Tympanic membrane normal.  Eyes:     Pupils: Pupils are equal, round, and reactive to light.  Neck:     Thyroid: No thyromegaly.  Cardiovascular:     Rate and Rhythm: Normal rate and regular rhythm.     Heart sounds: Normal heart sounds. No murmur heard.   Pulmonary:     Effort: Pulmonary effort is normal. No respiratory distress.     Breath sounds: Normal breath sounds. No wheezing.  Abdominal:     General: Bowel sounds are normal. There is no distension.     Palpations: Abdomen is soft.     Tenderness: There is no abdominal tenderness.  Musculoskeletal:        General: No tenderness. Normal range of motion.     Cervical back: Normal range of motion and neck supple.  Skin:    General: Skin is warm and dry.  Neurological:     Mental Status: She is alert and oriented to person, place, and time.     Cranial Nerves: No cranial nerve deficit.     Deep Tendon Reflexes: Reflexes are normal and symmetric.  Psychiatric:        Behavior: Behavior normal.        Thought Content: Thought content normal.        Judgment: Judgment normal.       BP (!) 152/72   Pulse 61   Temp (!) 97.3 F (36.3 C) (Temporal)   Ht 5\' 2"  (1.575 m)   Wt 161 lb 3.2 oz (73.1 kg)   BMI 29.48 kg/m      Assessment & Plan:  [name]   Diagnosis and orders addressed:  1. Pure  hypercholesterolemia - simvastatin (ZOCOR) 40 MG tablet; Take 1 tablet (40 mg total) by mouth daily.  Dispense: 90 tablet; Refill: 3  2. Primary osteoarthritis of both knees - traMADol (ULTRAM) 50 MG tablet; Take 1 tablet (50 mg total) by mouth every 6 (six) hours as needed. for pain  Dispense: 120 tablet; Refill: 2  3. Chronic midline low back pain without sciatica - traMADol (ULTRAM) 50 MG tablet; Take 1 tablet (50 mg total) by mouth every 6 (six) hours as needed. for pain  Dispense: 120 tablet; Refill: 2  4. Vitamin D deficiency  5. Controlled substance agreement signed  6. Gastroesophageal reflux disease without esophagitis  7. Constipation, unspecified constipation type  8. Primary hypertension Start Norvasc 5 mg today -Daily blood pressure log given with instructions on how to fill out and told to bring to next visit -Dash diet information given -Exercise encouraged - Stress Management  -Continue current meds -RTO in 2 weeks  - amLODipine (NORVASC) 5 MG tablet; Take 1 tablet (5 mg total) by mouth daily.  Dispense: 90 tablet; Refill: 3   Labs reviewed from Hematologists  Patient reviewed in [place] controlled database, no flags noted. Contract and drug screen are up to date.  Health Maintenance reviewed Diet and exercise encouraged  Follow up plan: 2 weeks for HTN    [name], FNP

## 2020-11-09 NOTE — Patient Instructions (Signed)

## 2020-11-25 ENCOUNTER — Other Ambulatory Visit: Payer: Self-pay

## 2020-11-25 ENCOUNTER — Encounter: Payer: Self-pay | Admitting: Family

## 2020-11-25 ENCOUNTER — Ambulatory Visit (INDEPENDENT_AMBULATORY_CARE_PROVIDER_SITE_OTHER): Payer: Medicare Other | Admitting: Family

## 2020-11-25 VITALS — BP 148/68 | HR 65 | Temp 97.3°F | Ht 62.0 in | Wt 163.6 lb

## 2020-11-25 DIAGNOSIS — I10 Essential (primary) hypertension: Secondary | ICD-10-CM

## 2020-11-25 MED ORDER — AMLODIPINE BESYLATE 10 MG PO TABS: 10.0000 mg | ORAL_TABLET | Freq: Every day | ORAL | 3 refills | Status: DC

## 2020-11-25 NOTE — Patient Instructions (Signed)

## 2020-11-25 NOTE — Progress Notes (Signed)
Subjective:    Patient ID: Kristi Barnes, female    DOB: 1950-12-07, 70 y.o.   MRN: [id number]  Chief Complaint  Patient presents with  . Hypertension   Pt presents to the office today to recheck HTN. PT was seen on 11/09/20 and we started her on Norvasc 5 mg. Her BP is still elevated today. She states her stress level have increased, because she has had to get her son admitted to Rehab.  Hypertension This is a chronic problem. The current episode started more than 1 year ago. The problem has been resolved since onset. The problem is controlled. Pertinent negatives include no malaise/fatigue, peripheral edema or shortness of breath. Risk factors for coronary artery disease include dyslipidemia, obesity and sedentary lifestyle. Past treatments include calcium channel blockers. The current treatment provides mild improvement. There is no history of heart failure.      Review of Systems  Constitutional: Negative for malaise/fatigue.  Respiratory: Negative for shortness of breath.   All other systems reviewed and are negative.      Objective:   Physical Exam Vitals reviewed.  Constitutional:      General: She is not in acute distress.    Appearance: She is well-developed.  HENT:     Head: Normocephalic and atraumatic.     Right Ear: Tympanic membrane normal.     Left Ear: Tympanic membrane normal.  Eyes:     Pupils: Pupils are equal, round, and reactive to light.  Neck:     Thyroid: No thyromegaly.  Cardiovascular:     Rate and Rhythm: Normal rate and regular rhythm.     Heart sounds: Normal heart sounds. No murmur heard.   Pulmonary:     Effort: Pulmonary effort is normal. No respiratory distress.     Breath sounds: Normal breath sounds. No wheezing.  Abdominal:     General: Bowel sounds are normal. There is no distension.     Palpations: Abdomen is soft.     Tenderness: There is no abdominal tenderness.  Musculoskeletal:        General: No tenderness. Normal range of  motion.     Cervical back: Normal range of motion and neck supple.  Skin:    General: Skin is warm and dry.  Neurological:     Mental Status: She is alert and oriented to person, place, and time.     Cranial Nerves: No cranial nerve deficit.     Deep Tendon Reflexes: Reflexes are normal and symmetric.  Psychiatric:        Behavior: Behavior normal.        Thought Content: Thought content normal.        Judgment: Judgment normal.     BP (!) 148/68   Pulse 65   Temp (!) 97.3 F (36.3 C) (Temporal)   Ht 5\' 2"  (1.575 m)   Wt 163 lb 9.6 oz (74.2 kg)   BMI 29.92 kg/m      Assessment & Plan:  comes in today with chief complaint of Hypertension   Diagnosis and orders addressed:  1. Primary hypertension Increase to Norvasc 10 mg from 5 mg -Daily blood pressure log given with instructions on how to fill out and told to bring to next visit -Dash diet information given -Exercise encouraged - Stress Management  -Continue current meds -RTO in 2-4 weeks  - amLODipine (NORVASC) 10 MG tablet; Take 1 tablet (10 mg total) by mouth daily.  Dispense: 90 tablet; Refill: 3  Evelina Dun, FNP

## 2020-12-17 ENCOUNTER — Ambulatory Visit (INDEPENDENT_AMBULATORY_CARE_PROVIDER_SITE_OTHER): Payer: Medicare Other | Admitting: Family

## 2020-12-17 ENCOUNTER — Other Ambulatory Visit: Payer: Self-pay

## 2020-12-17 ENCOUNTER — Encounter: Payer: Self-pay | Admitting: Family

## 2020-12-17 VITALS — BP 134/72 | HR 88 | Temp 97.5°F | Ht 62.0 in | Wt 166.0 lb

## 2020-12-17 DIAGNOSIS — I10 Essential (primary) hypertension: Secondary | ICD-10-CM | POA: Diagnosis not present

## 2020-12-17 NOTE — Patient Instructions (Signed)

## 2020-12-17 NOTE — Progress Notes (Signed)
   Subjective:    Patient ID: Kristi Barnes, female    DOB: 12/29/1950, 70 y.o.   MRN: [id number]  Chief Complaint  Patient presents with  . Hypertension   Pt presents to the office today to recheck HTN. She was seen on 11/25/20 and we increased her Norvasc to 10 mg from 5 mg. Her BP is at goal today! Hypertension This is a chronic problem. The current episode started more than 1 year ago. The problem has been resolved since onset. The problem is controlled. Pertinent negatives include no malaise/fatigue, peripheral edema or shortness of breath.      Review of Systems  Constitutional: Negative for malaise/fatigue.  Respiratory: Negative for shortness of breath.   All other systems reviewed and are negative.      Objective:   Physical Exam Vitals reviewed.  Constitutional:      General: She is not in acute distress.    Appearance: She is well-developed.  HENT:     Head: Normocephalic and atraumatic.     Right Ear: Tympanic membrane normal.     Left Ear: Tympanic membrane normal.  Eyes:     Pupils: Pupils are equal, round, and reactive to light.  Neck:     Thyroid: No thyromegaly.  Cardiovascular:     Rate and Rhythm: Normal rate and regular rhythm.     Heart sounds: Normal heart sounds. No murmur heard.   Pulmonary:     Effort: Pulmonary effort is normal. No respiratory distress.     Breath sounds: Normal breath sounds. No wheezing.  Abdominal:     General: Bowel sounds are normal. There is no distension.     Palpations: Abdomen is soft.     Tenderness: There is no abdominal tenderness.  Musculoskeletal:        General: No tenderness. Normal range of motion.     Cervical back: Normal range of motion and neck supple.  Skin:    General: Skin is warm and dry.  Neurological:     Mental Status: She is alert and oriented to person, place, and time.     Cranial Nerves: No cranial nerve deficit.     Deep Tendon Reflexes: Reflexes are normal and symmetric.  Psychiatric:         Behavior: Behavior normal.        Thought Content: Thought content normal.        Judgment: Judgment normal.       BP 134/72 Comment: at home reading  Pulse 88   Temp (!) 97.5 F (36.4 C) (Temporal)   Ht 5' 2" (1.575 m)   Wt 166 lb (75.3 kg)   BMI 30.36 kg/m      Assessment & Plan:  [name] comes in today with chief complaint of Hypertension   Diagnosis and orders addressed:  1. Primary hypertension At goal!! -Dash diet information given -Exercise encouraged - Stress Management  -Continue current meds -RTO in 6 months  - BMP8+EGFR    , FNP  

## 2021-01-18 ENCOUNTER — Ambulatory Visit (INDEPENDENT_AMBULATORY_CARE_PROVIDER_SITE_OTHER): Payer: Medicare Other

## 2021-01-18 VITALS — Ht 62.0 in | Wt 166.0 lb

## 2021-01-18 DIAGNOSIS — Z0000 Encounter for general adult medical examination without abnormal findings: Secondary | ICD-10-CM

## 2021-01-18 NOTE — Patient Instructions (Signed)
Kristi Barnes , Thank you for taking time to come for your Medicare Wellness Visit. I appreciate your ongoing commitment to your health goals. Please review the following plan we discussed and let me know if I can assist you in the future.   Screening recommendations/referrals: Colonoscopy: Done 02/19/2020 - Repeat in 10 years Mammogram: Done 02/12/2019 - Repeat every year (has appointment next month) Bone Density: Done 08/13/2020 - Repeat every 2 years Recommended yearly ophthalmology/optometry visit for glaucoma screening and checkup Recommended yearly dental visit for hygiene and checkup  Vaccinations: Influenza vaccine: Done 06/02/2020 - Repeat annually Pneumococcal vaccine: Prevnar done 08/10/2020; Due for Pneumovax 2022 Tdap vaccine: Due (every 10 years) Shingles vaccine: Due Shingrix discussed. Please contact your pharmacy for coverage information.    Covid-19: Declined  Advanced directives: Please bring a copy of your health care power of attorney and living will to the office to be added to your chart at your convenience.  Conditions/risks identified: Aim for 30 minutes of exercise or brisk walking each day, drink 6-8 glasses of water and eat lots of fruits and vegetables.  Next appointment: Follow up in one year for your annual wellness visit    Preventive Care 65 Years and Older, Female Preventive care refers to lifestyle choices and visits with your health care provider that can promote health and wellness. What does preventive care include?  A yearly physical exam. This is also called an annual well check.  Dental exams once or twice a year.  Routine eye exams. Ask your health care provider how often you should have your eyes checked.  Personal lifestyle choices, including:  Daily care of your teeth and gums.  Regular physical activity.  Eating a healthy diet.  Avoiding tobacco and drug use.  Limiting alcohol use.  Practicing safe sex.  Taking low-dose aspirin  every day.  Taking vitamin and mineral supplements as recommended by your health care provider. What happens during an annual well check? The services and screenings done by your health care provider during your annual well check will depend on your age, overall health, lifestyle risk factors, and family history of disease. Counseling  Your health care provider may ask you questions about your:  Alcohol use.  Tobacco use.  Drug use.  Emotional well-being.  Home and relationship well-being.  Sexual activity.  Eating habits.  History of falls.  Memory and ability to understand (cognition).  Work and work Statistician.  Reproductive health. Screening  You may have the following tests or measurements:  Height, weight, and BMI.  Blood pressure.  Lipid and cholesterol levels. These may be checked every 5 years, or more frequently if you are over 3 years old.  Skin check.  Lung cancer screening. You may have this screening every year starting at age 62 if you have a 30-pack-year history of smoking and currently smoke or have quit within the past 15 years.  Fecal occult blood test (FOBT) of the stool. You may have this test every year starting at age 23.  Flexible sigmoidoscopy or colonoscopy. You may have a sigmoidoscopy every 5 years or a colonoscopy every 10 years starting at age 58.  Hepatitis C blood test.  Hepatitis B blood test.  Sexually transmitted disease (STD) testing.  Diabetes screening. This is done by checking your blood sugar (glucose) after you have not eaten for a while (fasting). You may have this done every 1-3 years.  Bone density scan. This is done to screen for osteoporosis. You may have this  done starting at age 47.  Mammogram. This may be done every 1-2 years. Talk to your health care provider about how often you should have regular mammograms. Talk with your health care provider about your test results, treatment options, and if necessary,  the need for more tests. Vaccines  Your health care provider may recommend certain vaccines, such as:  Influenza vaccine. This is recommended every year.  Tetanus, diphtheria, and acellular pertussis (Tdap, Td) vaccine. You may need a Td booster every 10 years.  Zoster vaccine. You may need this after age 33.  Pneumococcal 13-valent conjugate (PCV13) vaccine. One dose is recommended after age 62.  Pneumococcal polysaccharide (PPSV23) vaccine. One dose is recommended after age 76. Talk to your health care provider about which screenings and vaccines you need and how often you need them. This information is not intended to replace advice given to you by your health care provider. Make sure you discuss any questions you have with your health care provider. Document Released: 09/10/2015 Document Revised: 05/03/2016 Document Reviewed: 06/15/2015 Elsevier Interactive Patient Education  2017 [place] Prevention in the Home Falls can cause injuries. They can happen to people of all ages. There are many things you can do to make your home safe and to help prevent falls. What can I do on the outside of my home?  Regularly fix the edges of walkways and driveways and fix any cracks.  Remove anything that might make you trip as you walk through a door, such as a raised step or threshold.  Trim any bushes or trees on the path to your home.  Use bright outdoor lighting.  Clear any walking paths of anything that might make someone trip, such as rocks or tools.  Regularly check to see if handrails are loose or broken. Make sure that both sides of any steps have handrails.  Any raised decks and porches should have guardrails on the edges.  Have any leaves, snow, or ice cleared regularly.  Use sand or salt on walking paths during winter.  Clean up any spills in your garage right away. This includes oil or grease spills. What can I do in the bathroom?  Use night lights.  Install  grab bars by the toilet and in the tub and shower. Do not use towel bars as grab bars.  Use non-skid mats or decals in the tub or shower.  If you need to sit down in the shower, use a plastic, non-slip stool.  Keep the floor dry. Clean up any water that spills on the floor as soon as it happens.  Remove soap buildup in the tub or shower regularly.  Attach bath mats securely with double-sided non-slip rug tape.  Do not have throw rugs and other things on the floor that can make you trip. What can I do in the bedroom?  Use night lights.  Make sure that you have a light by your bed that is easy to reach.  Do not use any sheets or blankets that are too big for your bed. They should not hang down onto the floor.  Have a firm chair that has side arms. You can use this for support while you get dressed.  Do not have throw rugs and other things on the floor that can make you trip. What can I do in the kitchen?  Clean up any spills right away.  Avoid walking on wet floors.  Keep items that you use a lot in easy-to-reach places.  If you need to reach something above you, use a strong step stool that has a grab bar.  Keep electrical cords out of the way.  Do not use floor polish or wax that makes floors slippery. If you must use wax, use non-skid floor wax.  Do not have throw rugs and other things on the floor that can make you trip. What can I do with my stairs?  Do not leave any items on the stairs.  Make sure that there are handrails on both sides of the stairs and use them. Fix handrails that are broken or loose. Make sure that handrails are as long as the stairways.  Check any carpeting to make sure that it is firmly attached to the stairs. Fix any carpet that is loose or worn.  Avoid having throw rugs at the top or bottom of the stairs. If you do have throw rugs, attach them to the floor with carpet tape.  Make sure that you have a light switch at the top of the stairs and  the bottom of the stairs. If you do not have them, ask someone to add them for you. What else can I do to help prevent falls?  Wear shoes that:  Do not have high heels.  Have rubber bottoms.  Are comfortable and fit you well.  Are closed at the toe. Do not wear sandals.  If you use a stepladder:  Make sure that it is fully opened. Do not climb a closed stepladder.  Make sure that both sides of the stepladder are locked into place.  Ask someone to hold it for you, if possible.  Clearly mark and make sure that you can see:  Any grab bars or handrails.  First and last steps.  Where the edge of each step is.  Use tools that help you move around (mobility aids) if they are needed. These include:  Canes.  Walkers.  Scooters.  Crutches.  Turn on the lights when you go into a dark area. Replace any light bulbs as soon as they burn out.  Set up your furniture so you have a clear path. Avoid moving your furniture around.  If any of your floors are uneven, fix them.  If there are any pets around you, be aware of where they are.  Review your medicines with your doctor. Some medicines can make you feel dizzy. This can increase your chance of falling. Ask your doctor what other things that you can do to help prevent falls. This information is not intended to replace advice given to you by your health care provider. Make sure you discuss any questions you have with your health care provider. Document Released: 06/10/2009 Document Revised: 01/20/2016 Document Reviewed: 09/18/2014 Elsevier Interactive Patient Education  2017 [employer]  Managing Pain Without Opioids Opioids are strong medicines used to treat moderate to severe pain. For some people, especially those who have long-term (chronic) pain, opioids may not be the best choice for pain management due to:  Side effects like nausea, constipation, and sleepiness.  The risk of addiction (opioid use disorder). The  longer you take opioids, the greater your risk of addiction. Pain that lasts for more than 3 months is called chronic pain. Managing chronic pain usually requires more than one approach and is often provided by a team of health care providers working together (multidisciplinary approach). Pain management may be done at a pain management center or pain clinic. Types of pain management without opioids Managing pain  without opioids can involve:  Non-opioid medicines.  Exercises to help relieve pain and improve strength and range of motion (physical therapy).  Therapy to help with everyday tasks and activities (occupational therapy).  Therapy to help you find ways to relieve pain by doing things you enjoy (recreational therapy).  Talk therapy (psychotherapy) and other mental health therapies.  Medical treatments such as injections or devices.  Making lifestyle changes. Pain management options Non-opioid medicines Non-opioid medicines for pain may include medicines taken by mouth (oral medicines), such as:  Over-the-counter or prescription NSAIDs. These may be the first medicines used for pain. They work well for muscle and bone pain, and they reduce swelling.  Acetaminophen. This over-the-counter medicine may work well for milder pain but not swelling.  Antidepressants. These may be used to treat chronic pain. A certain type of antidepressant (tricyclics) is often used. These medicines are given in lower doses for pain than when used for depression.  Anticonvulsants. These are usually used to treat seizures but may also reduce nerve (neuropathic) pain.  Muscle relaxants. These relieve pain caused by sudden muscle tightening (spasms). You may also use a type of pain medicine that is applied to the skin as a patch, cream, or gel (topical analgesic), such as a numbing medicine. These may cause fewer side effects than oral medicines. Therapy Physical therapy involves doing exercises to gain  strength and flexibility. A physical therapist may teach you exercises to move and stretch parts of your body that are weak, stiff, or painful. You can learn these exercises at physical therapy visits and practice them at home. Physical therapy may also involve:  Massage.  Heat wraps or applying heat or cold to affected areas.  Sending electrical signals through the skin to interrupt pain signals (transcutaneous electrical nerve stimulation, TENS).  Sending weak lasers through the skin to reduce pain and swelling (low-level laser therapy).  Using signals from your body to help you learn to regulate pain (biofeedback). Occupational therapy helps you learn ways to function at home and work with less pain. Recreational therapy may involve trying new activities or hobbies, such as drawing or a physical activity. Types of mental health therapy for pain include:  Cognitive behavioral therapy (CBT) to help you learn coping skills for dealing with pain.  Acceptance and commitment therapy (ACT) to change the way you think and react to pain.  Relaxation therapies, including muscle relaxation exercises and focusing your mind on the present moment to lower stress (mindfulness-based stress reduction).  Pain management counseling. This may be individual, family, or group counseling.   Medical treatments Medical treatments for pain management include:  Nerve block injections. These may include a pain blocker and anti-inflammatory medicines. You may have injections: ? Near the spine to relieve chronic back or neck pain. ? Into joints to relieve back or joint pain. ? Into nerve areas that supply a painful area to relieve body pain. ? Into muscles (trigger point injections) to relieve some painful muscle conditions.  A medical device placed near your spine to help block pain signals and relieve nerve pain or chronic back pain (spinal cord stimulation device).  Acupuncture. Follow these instructions  at home Medicines  Take over-the-counter and prescription medicines only as told by your health care provider.  If you are taking pain medicine, ask your health care providers about possible side effects to watch out for.  Do not drive or use heavy machinery while taking prescription pain medicine. Lifestyle  Do not use drugs  or alcohol to reduce pain. Limit alcohol intake to no more than 1 drink a day for nonpregnant women and 2 drinks a day for men. One drink equals 12 oz of beer, 5 oz of wine, or 1 oz of hard liquor.  Do not use any products that contain nicotine or tobacco, such as cigarettes and e-cigarettes. These can delay healing. If you need help quitting, ask your health care provider.  Eat a healthy diet and maintain a healthy weight. Poor diet and excess weight may make pain worse. ? Eat foods that are high in fiber. These include fresh fruits and vegetables, whole grains, and beans. ? Limit foods that are high in fat and processed sugars, such as fried and sweet foods.  Exercise regularly. Exercise lowers stress and may help relieve pain. ? Ask your health care provider what activities and exercises are safe for you. ? If your health care provider approves, join an exercise class that combines movement and stress reduction. Examples include yoga and tai chi.  Get enough sleep. Lack of sleep may make pain worse.  Lower stress as much as possible. Practice stress reduction techniques as told by your therapist.   General instructions  Work with all your pain management providers to find the treatments that work best for you. You are an important member of your pain management team. There are many things you can do to reduce pain on your own.  Consider joining an online or in-person support group for people who have chronic pain.  Keep all follow-up visits as told by your health care providers. This is important. Where to find more information You can find more information  about managing pain without opioids from:  American Academy of Pain Medicine: painmed.[place] for Chronic Pain: instituteforchronicpain.org  American Chronic Pain Association: theacpa.org Contact a health care provider if:  You have side effects from pain medicine.  Your pain gets worse or does not get better with treatments or home care.  You are struggling with anxiety or depression. Summary  Many types of pain can be managed without opioids. Chronic pain may respond better to pain management without opioids.  Pain is best managed with a team of providers working together.  Pain management without opioids may include non-opioid medicines, medical treatments, physical therapy, mental health therapy, and lifestyle changes.  Tell your health care providers if your pain gets worse or is not being managed well enough. This information is not intended to replace advice given to you by your health care provider. Make sure you discuss any questions you have with your health care provider. Document Revised: 05/27/2020 Document Reviewed: 05/27/2020 Elsevier Patient Education  [place].

## 2021-01-18 NOTE — Progress Notes (Signed)
Subjective:   Kristi Barnes is a 70 y.o. female who presents for Medicare Annual (Subsequent) preventive examination.  Virtual Visit via Telephone Note  I connected with  Kristi Barnes on 01/18/21 at 11:15 AM EDT by telephone and verified that I am speaking with the correct person using two identifiers.  Location: Patient: Home Provider: [facility] Persons participating in the virtual visit: patient/Nurse Health Advisor   I discussed the limitations, risks, security and privacy concerns of performing an evaluation and management service by telephone and the availability of in person appointments. The patient expressed understanding and agreed to proceed.  Interactive audio and video telecommunications were attempted between this nurse and patient, however failed, due to patient having technical difficulties OR patient did not have access to video capability.  We continued and completed visit with audio only.  Some vital signs may be absent or patient reported.   Kristi Barnes E Ural Acree, LPN   Review of Systems     Cardiac Risk Factors include: advanced age (>10men, >34 women);obesity (BMI >30kg/m2);dyslipidemia;hypertension     Objective:    Today's Vitals   01/18/21 1002  Weight: 166 lb (75.3 kg)  Height: 5\' 2"  (1.575 m)  PainSc: 3    Body mass index is 30.36 kg/m.  Advanced Directives 01/18/2021 10/26/2020 08/18/2020 07/29/2020 02/16/2020 11/05/2019 08/26/2019  Does Patient Have a Medical Advance Directive? No No No No No No No  Would patient like information on creating a medical advance directive? No - Patient declined No - Patient declined No - Patient declined No - Patient declined No - Patient declined No - Patient declined No - Patient declined    Current Medications (verified) Outpatient Encounter Medications as of 01/18/2021  Medication Sig  . amLODipine (NORVASC) 10 MG tablet Take 1 tablet (10 mg total) by mouth daily.  . Calcium Carbonate-Vit D-Min (CALCIUM 1200 PO) Take by  mouth.  . diclofenac Sodium (VOLTAREN) 1 % GEL Apply 2 g topically 4 (four) times daily.  . Ferrous Sulfate (IRON) 325 (65 Fe) MG TABS Take by mouth.  . Iron-Vitamins (GERITOL) LIQD Take 15 mLs by mouth daily.  01/20/2021 latanoprost (XALATAN) 0.005 % ophthalmic solution 1 drop at bedtime.  Marland Kitchen omeprazole (PRILOSEC) 20 MG capsule Take 1 capsule (20 mg total) by mouth daily.  . simvastatin (ZOCOR) 40 MG tablet Take 1 tablet (40 mg total) by mouth daily.  . traMADol (ULTRAM) 50 MG tablet Take 1 tablet (50 mg total) by mouth every 6 (six) hours as needed. for pain  . Vitamin D, Ergocalciferol, (DRISDOL) 1.25 MG (50000 UNIT) CAPS capsule TAKE ONE CAPSULE BY MOUTH EVERY 7 DAYS.  Marland Kitchen vitamin E 180 MG (400 UNITS) capsule Take 400 Units by mouth daily.    No facility-administered encounter medications on file as of 01/18/2021.    Allergies (verified) Asa [aspirin]   History: Past Medical History:  Diagnosis Date  . Arthritis   . GERD (gastroesophageal reflux disease)   . Glaucoma   . Hyperlipidemia   . Vitamin D deficiency    Past Surgical History:  Procedure Laterality Date  . bunions Bilateral   . COLONOSCOPY  01/2013   Dr. 02/2013: Fentanyl 150 mcg/Versed 6 mg, sessile polyp removed from the cecum, tortuous rectosigmoid colon.  Pathology revealed focal hyperplastic change, lymphoid aggregate of second fragment with colonic tissue.  Advised to have another colonoscopy in 5 to 10 years.  . COLONOSCOPY WITH PROPOFOL N/A 08/26/2019   Dr. 08/28/2019: INCOMPLETE DUE TO INADEQUATE BOWEL PREP.  stool throughout the colon. hemorrhoids noted.  . COLONOSCOPY WITH PROPOFOL N/A 02/19/2020   Procedure: COLONOSCOPY WITH PROPOFOL;  Surgeon: Corbin Ade, MD;  Location: [facility];  Service: Endoscopy;  Laterality: N/A;  2:30pm   Family History  Problem Relation Age of Onset  . Heart attack Mother   . Cancer Father        lung  . Leukemia Sister   . Cancer Brother        back of neck  . Cancer Sister         back of neck  . Colon cancer Neg Hx    Social History   Socioeconomic History  . Marital status: Divorced    Spouse name: Not on file  . Number of children: 2  . Years of education: 32  . Highest education level: High school graduate  Occupational History  . Occupation: retired  Tobacco Use  . Smoking status: Never Smoker  . Smokeless tobacco: Never Used  Vaping Use  . Vaping Use: Never used  Substance and Sexual Activity  . Alcohol use: No  . Drug use: No  . Sexual activity: Not Currently  Other Topics Concern  . Not on file  Social History Narrative   Lives alone. Twin sister lives next door. Lots of family nearby   Social Determinants of Health   Financial Resource Strain: Low Risk   . Difficulty of Paying Living Expenses: Not hard at all  Food Insecurity: No Food Insecurity  . Worried About Programme researcher, broadcasting/film/video in the Last Year: Never true  . Ran Out of Food in the Last Year: Never true  Transportation Needs: No Transportation Needs  . Lack of Transportation (Medical): No  . Lack of Transportation (Non-Medical): No  Physical Activity: Sufficiently Active  . Days of Exercise per Week: 7 days  . Minutes of Exercise per Session: 30 min  Stress: No Stress Concern Present  . Feeling of Stress : Only a little  Social Connections: Moderately Integrated  . Frequency of Communication with Friends and Family: More than three times a week  . Frequency of Social Gatherings with Friends and Family: More than three times a week  . Attends Religious Services: More than 4 times per year  . Active Member of Clubs or Organizations: Yes  . Attends Banker Meetings: More than 4 times per year  . Marital Status: Divorced    Tobacco Counseling Counseling given: Not Answered   Clinical Intake:  Pre-visit preparation completed: Yes  Pain : 0-10 Pain Score: 3  Pain Type: Neuropathic pain Pain Location: Finger (Comment which one) Pain Descriptors /  Indicators: Aching,Cramping,Discomfort Pain Onset: 1 to 4 weeks ago Pain Frequency: Intermittent     BMI - recorded: 30.36 Nutritional Status: BMI > 30  Obese Nutritional Risks: None Diabetes: No  How often do you need to have someone help you when you read instructions, pamphlets, or other written materials from your doctor or pharmacy?: 1 - Never  Diabetic? No  Interpreter Needed?: No  Information entered by :: Brody Bonneau, LPN   Activities of Daily Living In your present state of health, do you have any difficulty performing the following activities: 01/18/2021 02/16/2020  Hearing? N N  Vision? N N  Difficulty concentrating or making decisions? N N  Walking or climbing stairs? Y Y  Comment - weakness  Dressing or bathing? N N  Doing errands, shopping? N N  Preparing Food and eating ? N -  Using the Toilet? N -  In the past six months, have you accidently leaked urine? Y -  Do you have problems with loss of bowel control? N -  Managing your Medications? N -  Managing your Finances? N -  Housekeeping or managing your Housekeeping? N -  Some recent data might be hidden    Patient Care Team: Junie Spencer, FNP as PCP - General (Family Medicine) West Bali, MD (Inactive) as Consulting Physician (Gastroenterology)  Indicate any recent Medical Services you may have received from other than [facility] providers in the past year (date may be approximate).     Assessment:   This is a routine wellness examination for [facility]  Hearing/Vision screen  Hearing Screening   125Hz  250Hz  500Hz  1000Hz  2000Hz  3000Hz  4000Hz  6000Hz  8000Hz   Right ear:           Left ear:           Comments: Denies hearing difficulties  Vision Screening Comments: Per patient, she has cataracts and glaucoma; wears eyeglasses - Annual visits with Dr Earlene Plater - up to date with eye exam  Dietary issues and exercise activities discussed: Current Exercise Habits: Home exercise routine, Type of exercise:  strength training/weights;walking, Time (Minutes): 30, Frequency (Times/Week): 7, Weekly Exercise (Minutes/Week): 210, Intensity: Mild, Exercise limited by: orthopedic condition(s);neurologic condition(s)  Goals Addressed            This Visit's Progress   . DIET - INCREASE WATER INTAKE   On track    Try to drink 6-8 glasses of water daily      Depression Screen PHQ 2/9 Scores 01/18/2021 12/17/2020 11/25/2020 11/09/2020 08/10/2020 11/07/2019 11/05/2019  PHQ - 2 Score 0 0 0 0 0 1 2  PHQ- 9 Score - - - - - 5 7    Fall Risk Fall Risk  01/18/2021 12/17/2020 11/25/2020 11/09/2020 08/10/2020  Falls in the past year? 0 0 0 0 0  Number falls in past yr: 0 - - - -  Injury with Fall? 0 - - - -  Risk for fall due to : Orthopedic patient;Medication side effect;Impaired vision - - - -  Follow up Education provided;Falls prevention discussed - - - -    FALL RISK PREVENTION PERTAINING TO THE HOME:  Any stairs in or around the home? No  If so, are there any without handrails? No  Home free of loose throw rugs in walkways, pet beds, electrical cords, etc? Yes  Adequate lighting in your home to reduce risk of falls? Yes   ASSISTIVE DEVICES UTILIZED TO PREVENT FALLS:  Life alert? No  Use of a cane, walker or w/c? No  Grab bars in the bathroom? Yes  Shower chair or bench in shower? No  Elevated toilet seat or a handicapped toilet? No   TIMED UP AND GO:  Was the test performed? No . Telephonic visit  Cognitive Function: Normal cognitive status assessed by direct observation by this Nurse Health Advisor. No abnormalities found.      6CIT Screen 11/05/2019  What Year? 0 points  What month? 0 points  What time? 0 points  Count back from 20 0 points  Months in reverse 4 points  Repeat phrase 2 points  Total Score 6    Immunizations Immunization History  Administered Date(s) Administered  . Influenza-Unspecified 06/02/2020  . Pneumococcal Conjugate-13 08/10/2020  . Td 08/18/1999    TDAP  status: Due, Education has been provided regarding the importance of this vaccine. Advised may  receive this vaccine at local pharmacy or Health Dept. Aware to provide a copy of the vaccination record if obtained from local pharmacy or Health Dept. Verbalized acceptance and understanding.  Flu Vaccine status: Up to date  Pneumococcal vaccine status: Up to date  Covid-19 vaccine status: Declined, Education has been provided regarding the importance of this vaccine but patient still declined. Advised may receive this vaccine at local pharmacy or Health Dept.or vaccine clinic. Aware to provide a copy of the vaccination record if obtained from local pharmacy or Health Dept. Verbalized acceptance and understanding.  Qualifies for Shingles Vaccine? Yes   Zostavax completed No   Shingrix Completed?: No.    Education has been provided regarding the importance of this vaccine. Patient has been advised to call insurance company to determine out of pocket expense if they have not yet received this vaccine. Advised may also receive vaccine at local pharmacy or Health Dept. Verbalized acceptance and understanding.  Screening Tests Health Maintenance  Topic Date Due  . MAMMOGRAM  02/12/2020  . TETANUS/TDAP  08/10/2021 (Originally 08/17/2009)  . INFLUENZA VACCINE  03/28/2021  . PNA vac Low Risk Adult (2 of 2 - PPSV23) 08/10/2021  . DEXA SCAN  08/13/2022  . COLONOSCOPY (Pts 45-11yrs Insurance coverage will need to be confirmed)  02/18/2030  . Hepatitis C Screening  Completed  . HPV VACCINES  Aged Out  . COVID-19 Vaccine  Discontinued    Health Maintenance  Health Maintenance Due  Topic Date Due  . MAMMOGRAM  02/12/2020    Colorectal cancer screening: Type of screening: Colonoscopy. Completed 02/19/2020. Repeat every 10 years  Mammogram status: Completed 2020. Repeat every year has appt next month  Bone Density status: Completed 08/13/2020. Results reflect: Bone density results: OSTEOPENIA. Repeat  every 2 years.  Lung Cancer Screening: (Low Dose CT Chest recommended if Age 77-80 years, 30 pack-year currently smoking OR have quit w/in 15years.) does not qualify.   Additional Screening:  Hepatitis C Screening: does qualify; Completed 05/07/2020  Vision Screening: Recommended annual ophthalmology exams for early detection of glaucoma and other disorders of the eye. Is the patient up to date with their annual eye exam?  Yes  Who is the provider or what is the name of the office in which the patient attends annual eye exams? Earlene Plater If pt is not established with a provider, would they like to be referred to a provider to establish care? No .   Dental Screening: Recommended annual dental exams for proper oral hygiene  Community Resource Referral / Chronic Care Management: CRR required this visit?  No   CCM required this visit?  No      Plan:     I have personally reviewed and noted the following in the patient's chart:   . Medical and social history . Use of alcohol, tobacco or illicit drugs  . Current medications and supplements including opioid prescriptions.  . Functional ability and status . Nutritional status . Physical activity . Advanced directives . List of other physicians . Hospitalizations, surgeries, and ER visits in previous 12 months . Vitals . Screenings to include cognitive, depression, and falls . Referrals and appointments  In addition, I have reviewed and discussed with patient certain preventive protocols, quality metrics, and best practice recommendations. A written personalized care plan for preventive services as well as general preventive health recommendations were provided to patient.     [place] Constable, LPN   [id number]/[id number]   Nurse Notes: None

## 2021-01-25 ENCOUNTER — Other Ambulatory Visit (HOSPITAL_COMMUNITY): Payer: Self-pay | Admitting: Surgery

## 2021-01-25 DIAGNOSIS — E559 Vitamin D deficiency, unspecified: Secondary | ICD-10-CM

## 2021-01-25 DIAGNOSIS — M791 Myalgia, unspecified site: Secondary | ICD-10-CM

## 2021-01-25 DIAGNOSIS — D509 Iron deficiency anemia, unspecified: Secondary | ICD-10-CM

## 2021-01-25 DIAGNOSIS — D513 Other dietary vitamin B12 deficiency anemia: Secondary | ICD-10-CM

## 2021-01-26 ENCOUNTER — Other Ambulatory Visit: Payer: Self-pay

## 2021-01-26 ENCOUNTER — Inpatient Hospital Stay (HOSPITAL_COMMUNITY): Payer: Medicare Other | Attending: Hematology

## 2021-01-26 DIAGNOSIS — E559 Vitamin D deficiency, unspecified: Secondary | ICD-10-CM | POA: Insufficient documentation

## 2021-01-26 DIAGNOSIS — Z806 Family history of leukemia: Secondary | ICD-10-CM | POA: Diagnosis not present

## 2021-01-26 DIAGNOSIS — D513 Other dietary vitamin B12 deficiency anemia: Secondary | ICD-10-CM

## 2021-01-26 DIAGNOSIS — K5900 Constipation, unspecified: Secondary | ICD-10-CM | POA: Diagnosis not present

## 2021-01-26 DIAGNOSIS — M1990 Unspecified osteoarthritis, unspecified site: Secondary | ICD-10-CM | POA: Diagnosis not present

## 2021-01-26 DIAGNOSIS — D509 Iron deficiency anemia, unspecified: Secondary | ICD-10-CM

## 2021-01-26 DIAGNOSIS — M791 Myalgia, unspecified site: Secondary | ICD-10-CM

## 2021-01-26 DIAGNOSIS — Z809 Family history of malignant neoplasm, unspecified: Secondary | ICD-10-CM | POA: Insufficient documentation

## 2021-01-26 DIAGNOSIS — D539 Nutritional anemia, unspecified: Secondary | ICD-10-CM | POA: Insufficient documentation

## 2021-01-26 LAB — CBC WITH DIFFERENTIAL/PLATELET
Abs Immature Granulocytes: 0.01 10*3/uL (ref 0.00–0.07)
Basophils Absolute: 0 10*3/uL (ref 0.0–0.1)
Basophils Relative: 0 %
Eosinophils Absolute: 0 10*3/uL (ref 0.0–0.5)
Eosinophils Relative: 1 %
HCT: 34 % — ABNORMAL LOW (ref 36.0–46.0)
Hemoglobin: 11.3 g/dL — ABNORMAL LOW (ref 12.0–15.0)
Immature Granulocytes: 0 %
Lymphocytes Relative: 48 %
Lymphs Abs: 2.8 10*3/uL (ref 0.7–4.0)
MCH: 31 pg (ref 26.0–34.0)
MCHC: 33.2 g/dL (ref 30.0–36.0)
MCV: 93.2 fL (ref 80.0–100.0)
Monocytes Absolute: 0.4 10*3/uL (ref 0.1–1.0)
Monocytes Relative: 6 %
Neutro Abs: 2.6 10*3/uL (ref 1.7–7.7)
Neutrophils Relative %: 45 %
Platelets: 264 10*3/uL (ref 150–400)
RBC: 3.65 MIL/uL — ABNORMAL LOW (ref 3.87–5.11)
RDW: 12.2 % (ref 11.5–15.5)
WBC: 5.8 10*3/uL (ref 4.0–10.5)
nRBC: 0 % (ref 0.0–0.2)

## 2021-01-26 LAB — VITAMIN D 25 HYDROXY (VIT D DEFICIENCY, FRACTURES): Vit D, 25-Hydroxy: 103.68 ng/mL — ABNORMAL HIGH (ref 30–100)

## 2021-01-26 LAB — IRON AND TIBC
Iron: 78 ug/dL (ref 28–170)
Saturation Ratios: 23 % (ref 10.4–31.8)
TIBC: 338 ug/dL (ref 250–450)
UIBC: 260 ug/dL

## 2021-01-26 LAB — VITAMIN B12: Vitamin B-12: 288 pg/mL (ref 180–914)

## 2021-01-26 LAB — FERRITIN: Ferritin: 178 ng/mL (ref 11–307)

## 2021-02-02 ENCOUNTER — Other Ambulatory Visit: Payer: Self-pay

## 2021-02-02 ENCOUNTER — Inpatient Hospital Stay (HOSPITAL_BASED_OUTPATIENT_CLINIC_OR_DEPARTMENT_OTHER): Payer: Medicare Other | Admitting: Hematology and Oncology

## 2021-02-02 DIAGNOSIS — K5900 Constipation, unspecified: Secondary | ICD-10-CM | POA: Diagnosis not present

## 2021-02-02 DIAGNOSIS — E559 Vitamin D deficiency, unspecified: Secondary | ICD-10-CM | POA: Diagnosis not present

## 2021-02-02 DIAGNOSIS — D539 Nutritional anemia, unspecified: Secondary | ICD-10-CM

## 2021-02-02 NOTE — Progress Notes (Signed)
[facility] FOLLOW-UP progress notes  Patient Care Team: Junie Spencer, FNP as PCP - General (Family Medicine) West Bali, MD (Inactive) as Consulting Physician (Gastroenterology)  CHIEF COMPLAINTS/PURPOSE OF VISIT:  Multiple mineral deficiencies, for further evaluation  HISTORY OF PRESENTING ILLNESS:  Kristi Barnes 70 y.o. female is seen today because her primary oncologist is not available She was diagnosed with multiple mineral deficiency, specifically iron deficiency, B12 deficiency and vitamin D deficiency She has been taking oral iron supplement daily She has some mild constipation with that Otherwise, she feels good Since she started high-dose vitamin D supplement, her arthritis pain has improved The patient denies any recent signs or symptoms of bleeding such as spontaneous epistaxis, hematuria or hematochezia.   MEDICAL HISTORY:  Past Medical History:  Diagnosis Date  . Arthritis   . GERD (gastroesophageal reflux disease)   . Glaucoma   . Hyperlipidemia   . Vitamin D deficiency     SURGICAL HISTORY: Past Surgical History:  Procedure Laterality Date  . bunions Bilateral   . COLONOSCOPY  01/2013   Dr. Mikal Plane: Fentanyl 150 mcg/Versed 6 mg, sessile polyp removed from the cecum, tortuous rectosigmoid colon.  Pathology revealed focal hyperplastic change, lymphoid aggregate of second fragment with colonic tissue.  Advised to have another colonoscopy in 5 to 10 years.  . COLONOSCOPY WITH PROPOFOL N/A 08/26/2019   Dr. Darrick Penna: INCOMPLETE DUE TO INADEQUATE BOWEL PREP. stool throughout the colon. hemorrhoids noted.  . COLONOSCOPY WITH PROPOFOL N/A 02/19/2020   Procedure: COLONOSCOPY WITH PROPOFOL;  Surgeon: Corbin Ade, MD;  Location: [facility];  Service: Endoscopy;  Laterality: N/A;  2:30pm    SOCIAL HISTORY: Social History   Socioeconomic History  . Marital status: Divorced    Spouse name: Not on file  . Number of children: 2  .  Years of education: 71  . Highest education level: High school graduate  Occupational History  . Occupation: retired  Tobacco Use  . Smoking status: Never Smoker  . Smokeless tobacco: Never Used  Vaping Use  . Vaping Use: Never used  Substance and Sexual Activity  . Alcohol use: No  . Drug use: No  . Sexual activity: Not Currently  Other Topics Concern  . Not on file  Social History Narrative   Lives alone. Twin sister lives next door. Lots of family nearby   Social Determinants of Health   Financial Resource Strain: Low Risk   . Difficulty of Paying Living Expenses: Not hard at all  Food Insecurity: No Food Insecurity  . Worried About Programme researcher, broadcasting/film/video in the Last Year: Never true  . Ran Out of Food in the Last Year: Never true  Transportation Needs: No Transportation Needs  . Lack of Transportation (Medical): No  . Lack of Transportation (Non-Medical): No  Physical Activity: Sufficiently Active  . Days of Exercise per Week: 7 days  . Minutes of Exercise per Session: 30 min  Stress: No Stress Concern Present  . Feeling of Stress : Only a little  Social Connections: Moderately Integrated  . Frequency of Communication with Friends and Family: More than three times a week  . Frequency of Social Gatherings with Friends and Family: More than three times a week  . Attends Religious Services: More than 4 times per year  . Active Member of Clubs or Organizations: Yes  . Attends Banker Meetings: More than 4 times per year  . Marital Status: Divorced  Catering manager  Violence: Not At Risk  . Fear of Current or Ex-Partner: No  . Emotionally Abused: No  . Physically Abused: No  . Sexually Abused: No    FAMILY HISTORY: Family History  Problem Relation Age of Onset  . Heart attack Mother   . Cancer Father        lung  . Leukemia Sister   . Cancer Brother        back of neck  . Cancer Sister        back of neck  . Colon cancer Neg Hx     ALLERGIES:  is  allergic to asa [aspirin].  MEDICATIONS:  Current Outpatient Medications  Medication Sig Dispense Refill  . amLODipine (NORVASC) 10 MG tablet Take 1 tablet (10 mg total) by mouth daily. 90 tablet 3  . Calcium Carbonate-Vit D-Min (CALCIUM 1200 PO) Take by mouth.    . diclofenac Sodium (VOLTAREN) 1 % GEL Apply 2 g topically 4 (four) times daily. 150 g 2  . Ferrous Sulfate (IRON) 325 (65 Fe) MG TABS Take by mouth.    . Iron-Vitamins (GERITOL) LIQD Take 15 mLs by mouth daily.    Marland Kitchen latanoprost (XALATAN) 0.005 % ophthalmic solution 1 drop at bedtime.    Marland Kitchen omeprazole (PRILOSEC) 20 MG capsule Take 1 capsule (20 mg total) by mouth daily. 90 capsule 1  . simvastatin (ZOCOR) 40 MG tablet Take 1 tablet (40 mg total) by mouth daily. 90 tablet 3  . traMADol (ULTRAM) 50 MG tablet Take 1 tablet (50 mg total) by mouth every 6 (six) hours as needed. for pain 120 tablet 2  . Vitamin D, Ergocalciferol, (DRISDOL) 1.25 MG (50000 UNIT) CAPS capsule TAKE ONE CAPSULE BY MOUTH EVERY 7 DAYS. 12 capsule 3  . vitamin E 180 MG (400 UNITS) capsule Take 400 Units by mouth daily.      No current facility-administered medications for this visit.    REVIEW OF SYSTEMS:   Constitutional: Denies fevers, chills or abnormal night sweats Eyes: Denies blurriness of vision, double vision or watery eyes Ears, nose, mouth, throat, and face: Denies mucositis or sore throat Respiratory: Denies cough, dyspnea or wheezes Cardiovascular: Denies palpitation, chest discomfort or lower extremity swelling Gastrointestinal:  Denies nausea, heartburn or change in bowel habits Skin: Denies abnormal skin rashes Lymphatics: Denies new lymphadenopathy or easy bruising Neurological:Denies numbness, tingling or new weaknesses Behavioral/Psych: Mood is stable, no new changes  All other systems were reviewed with the patient and are negative.  PHYSICAL EXAMINATION: ECOG PERFORMANCE STATUS: 0 - Asymptomatic  Vitals:   02/02/21 1322 02/02/21  1324  BP: (!) 130/114 127/64  Pulse: 84   Resp: 18   Temp: 98.7 F (37.1 C)   SpO2: 100%    Filed Weights   02/02/21 1322  Weight: 166 lb (75.3 kg)    GENERAL:alert, no distress and comfortable SKIN: skin color, texture, turgor are normal, no rashes or significant lesions EYES: normal, conjunctiva are pink and non-injected, sclera clear OROPHARYNX:no exudate, normal lips, buccal mucosa, and tongue  NECK: supple, thyroid normal size, non-tender, without nodularity LYMPH:  no palpable lymphadenopathy in the cervical, axillary or inguinal LUNGS: clear to auscultation and percussion with normal breathing effort HEART: regular rate & rhythm and no murmurs without lower extremity edema ABDOMEN:abdomen soft, non-tender and normal bowel sounds Musculoskeletal:no cyanosis of digits and no clubbing  PSYCH: alert & oriented x 3 with fluent speech NEURO: no focal motor/sensory deficits  LABORATORY DATA:  I have reviewed the data as  listed Lab Results  Component Value Date   WBC 5.8 01/26/2021   HGB 11.3 (L) 01/26/2021   HCT 34.0 (L) 01/26/2021   MCV 93.2 01/26/2021   PLT 264 01/26/2021   Recent Labs    02/16/20 1428 05/07/20 1111 07/29/20 1253 09/22/20 1106  NA 139 138 138 139  K 3.9 3.6 4.2 4.5  CL 104 99 103 104  CO2 26 25 26 29   GLUCOSE 75 90 86 84  BUN 15 8 11 16   CREATININE 0.95 1.07* 0.86 1.04*  CALCIUM 9.0 8.8 9.3 9.5  GFRNONAA >60 54* >60 59*  GFRAA >60 62  --   --   PROT  --  7.4 7.9 7.6  ALBUMIN  --  4.6 4.4 4.2  AST  --  25 18 20   ALT  --  18 15 15   ALKPHOS  --  70 54 56  BILITOT  --  <0.2 0.4 0.3    ASSESSMENT & PLAN:  Deficiency anemia The cause of her anemia is multifactorial Her B12 level is good She is not iron deficient Despite that, she is still borderline anemic On review of her previous blood work, I noted borderline elevated creatinine It is possible she might have anemia chronic kidney disease I recommend increase hydration and aggressive  risk factor modification We will see her again and recheck her blood work in a few months  Vitamin D deficiency Her vitamin D level is very high I recommend reducing her vitamin D supplement to once a week She has been taking the 5000 units twice daily for a long time  Constipation This was exacerbated by her oral iron supplement We discussed laxative therapy and increase fluid hydration   No orders of the defined types were placed in this encounter.   All questions were answered. The patient knows to call the clinic with any problems, questions or concerns. The total time spent in the appointment was 20 minutes encounter with patients including review of chart and various tests results, discussions about plan of care and coordination of care plan   , MD 02/02/2021 2:49 PM

## 2021-02-02 NOTE — Assessment & Plan Note (Signed)
Her vitamin D level is very high I recommend reducing her vitamin D supplement to once a week She has been taking the 5000 units twice daily for a long time

## 2021-02-02 NOTE — Assessment & Plan Note (Signed)
The cause of her anemia is multifactorial Her B12 level is good She is not iron deficient Despite that, she is still borderline anemic On review of her previous blood work, I noted borderline elevated creatinine It is possible she might have anemia chronic kidney disease I recommend increase hydration and aggressive risk factor modification We will see her again and recheck her blood work in a few months

## 2021-02-02 NOTE — Assessment & Plan Note (Signed)
This was exacerbated by her oral iron supplement We discussed laxative therapy and increase fluid hydration

## 2021-03-11 ENCOUNTER — Other Ambulatory Visit: Payer: Self-pay | Admitting: Family

## 2021-03-11 DIAGNOSIS — M545 Low back pain, unspecified: Secondary | ICD-10-CM

## 2021-03-11 DIAGNOSIS — G8929 Other chronic pain: Secondary | ICD-10-CM

## 2021-03-11 DIAGNOSIS — M170 Bilateral primary osteoarthritis of knee: Secondary | ICD-10-CM

## 2021-04-25 ENCOUNTER — Other Ambulatory Visit: Payer: Self-pay | Admitting: Family

## 2021-05-19 ENCOUNTER — Other Ambulatory Visit: Payer: Self-pay | Admitting: Family

## 2021-05-19 DIAGNOSIS — G8929 Other chronic pain: Secondary | ICD-10-CM

## 2021-05-19 DIAGNOSIS — M170 Bilateral primary osteoarthritis of knee: Secondary | ICD-10-CM

## 2021-05-19 DIAGNOSIS — M545 Low back pain, unspecified: Secondary | ICD-10-CM

## 2021-06-16 ENCOUNTER — Other Ambulatory Visit: Payer: Self-pay

## 2021-06-16 ENCOUNTER — Ambulatory Visit (INDEPENDENT_AMBULATORY_CARE_PROVIDER_SITE_OTHER): Payer: Medicare Other | Admitting: Family

## 2021-06-16 ENCOUNTER — Encounter: Payer: Self-pay | Admitting: Family

## 2021-06-16 VITALS — BP 134/67 | HR 79 | Temp 98.3°F | Ht 62.0 in | Wt 173.6 lb

## 2021-06-16 DIAGNOSIS — M545 Low back pain, unspecified: Secondary | ICD-10-CM

## 2021-06-16 DIAGNOSIS — M170 Bilateral primary osteoarthritis of knee: Secondary | ICD-10-CM

## 2021-06-16 DIAGNOSIS — G8929 Other chronic pain: Secondary | ICD-10-CM

## 2021-06-16 DIAGNOSIS — K219 Gastro-esophageal reflux disease without esophagitis: Secondary | ICD-10-CM | POA: Diagnosis not present

## 2021-06-16 DIAGNOSIS — Z23 Encounter for immunization: Secondary | ICD-10-CM | POA: Diagnosis not present

## 2021-06-16 DIAGNOSIS — E7800 Pure hypercholesterolemia, unspecified: Secondary | ICD-10-CM

## 2021-06-16 DIAGNOSIS — I10 Essential (primary) hypertension: Secondary | ICD-10-CM | POA: Diagnosis not present

## 2021-06-16 DIAGNOSIS — E559 Vitamin D deficiency, unspecified: Secondary | ICD-10-CM

## 2021-06-16 DIAGNOSIS — Z79899 Other long term (current) drug therapy: Secondary | ICD-10-CM

## 2021-06-16 DIAGNOSIS — R609 Edema, unspecified: Secondary | ICD-10-CM

## 2021-06-16 MED ORDER — TRAMADOL HCL 50 MG PO TABS: 50.0000 mg | ORAL_TABLET | Freq: Four times a day (QID) | ORAL | 2 refills | Status: DC | PRN

## 2021-06-16 NOTE — Patient Instructions (Signed)
Peripheral Edema Peripheral edema is swelling that is caused by a buildup of fluid. Peripheral edema most often affects the lower legs, ankles, and feet. It can also develop in the arms, hands, and face. The area of the body that has peripheral edema will look swollen. It may also feel heavy or warm. Your clothes may start to feel tight. Pressing on the area may make a temporary dent in your skin. You may not be able to move your swollen arm or leg as much as usual. There are many causes of peripheral edema. It can happen because of a complication of other conditions such as congestive heart failure, kidney disease, or a problem with your blood circulation. It also can be a side effect of certain medicines or because of an infection. It often happens to women during pregnancy. Sometimes, the cause is not known. Follow these instructions at home: Managing pain, stiffness, and swelling  Raise (elevate) your legs while you are sitting or lying down. Move around often to prevent stiffness and to lessen swelling. Do not sit or stand for long periods of time. Wear support stockings as told by your health care provider. Medicines Take over-the-counter and prescription medicines only as told by your health care provider. Your health care provider may prescribe medicine to help your body get rid of excess water (diuretic). General instructions Pay attention to any changes in your symptoms. Follow instructions from your health care provider about limiting salt (sodium) in your diet. Sometimes, eating less salt may reduce swelling. Moisturize skin daily to help prevent skin from cracking and draining. Keep all follow-up visits as told by your health care provider. This is important. Contact a health care provider if you have: A fever. Edema that starts suddenly or is getting worse, especially if you are pregnant or have a medical condition. Swelling in only one leg. Increased swelling, redness, or pain in  one or both of your legs. Drainage or sores at the area where you have edema. Get help right away if you: Develop shortness of breath, especially when you are lying down. Have pain in your chest or abdomen. Feel weak. Feel faint. Summary Peripheral edema is swelling that is caused by a buildup of fluid. Peripheral edema most often affects the lower legs, ankles, and feet. Move around often to prevent stiffness and to lessen swelling. Do not sit or stand for long periods of time. Pay attention to any changes in your symptoms. Contact a health care provider if you have edema that starts suddenly or is getting worse, especially if you are pregnant or have a medical condition. Get help right away if you develop shortness of breath, especially when lying down. This information is not intended to replace advice given to you by your health care provider. Make sure you discuss any questions you have with your health care provider. Document Revised: 05/08/2018 Document Reviewed: 05/08/2018 Elsevier Patient Education  2022 Elsevier Inc.  

## 2021-06-16 NOTE — Progress Notes (Signed)
Subjective:    Patient ID: Kristi Barnes, female    DOB: 08/17/1951, 70 y.o.   MRN: [id number]  Chief Complaint  Patient presents with   Medical Management of Chronic Issues   Arthritis   PT presents to the office today for chronic follow up. She is followed by Hematologists every 6 months for iron deficiency anemia.  Arthritis Presents for follow-up visit. She complains of pain and stiffness. The symptoms have been stable. Affected locations include the left knee, right knee, left MCP and right MCP. Her pain is at a severity of 8/10.  Hypertension This is a chronic problem. The current episode started more than 1 year ago. The problem has been resolved since onset. The problem is controlled. Associated symptoms include peripheral edema. Pertinent negatives include no malaise/fatigue or shortness of breath. Risk factors for coronary artery disease include dyslipidemia and obesity. The current treatment provides moderate improvement.  Gastroesophageal Reflux She complains of belching and heartburn. This is a chronic problem. The current episode started more than 1 year ago. The problem occurs occasionally. The problem has been waxing and waning. She has tried a PPI for the symptoms. The treatment provided moderate relief.  Hyperlipidemia This is a chronic problem. The current episode started more than 1 year ago. Exacerbating diseases include obesity. Pertinent negatives include no shortness of breath. Current antihyperlipidemic treatment includes statins. The current treatment provides moderate improvement of lipids. Risk factors for coronary artery disease include dyslipidemia, hypertension, a sedentary lifestyle and post-menopausal.     Review of Systems  Constitutional:  Negative for malaise/fatigue.  Respiratory:  Negative for shortness of breath.   Gastrointestinal:  Positive for heartburn.  Musculoskeletal:  Positive for arthritis and stiffness.  All other systems reviewed and are  negative.     Objective:   Physical Exam Vitals reviewed.  Constitutional:      General: She is not in acute distress.    Appearance: She is well-developed.  HENT:     Head: Normocephalic and atraumatic.     Right Ear: Tympanic membrane normal.     Left Ear: Tympanic membrane normal.  Eyes:     Pupils: Pupils are equal, round, and reactive to light.  Neck:     Thyroid: No thyromegaly.  Cardiovascular:     Rate and Rhythm: Normal rate and regular rhythm.     Heart sounds: Normal heart sounds. No murmur heard. Pulmonary:     Effort: Pulmonary effort is normal. No respiratory distress.     Breath sounds: Normal breath sounds. No wheezing.  Abdominal:     General: Bowel sounds are normal. There is no distension.     Palpations: Abdomen is soft.     Tenderness: There is no abdominal tenderness.  Musculoskeletal:        General: No tenderness. Normal range of motion.     Cervical back: Normal range of motion and neck supple.     Right lower leg: Edema (trace) present.     Left lower leg: Edema (trace) present.  Skin:    General: Skin is warm and dry.  Neurological:     Mental Status: She is alert and oriented to person, place, and time.     Cranial Nerves: No cranial nerve deficit.     Deep Tendon Reflexes: Reflexes are normal and symmetric.  Psychiatric:        Behavior: Behavior normal.        Thought Content: Thought content normal.  Judgment: Judgment normal.      BP 134/67   Pulse 79   Temp 98.3 F (36.8 C) (Temporal)   Ht 5\' 2"  (1.575 m)   Wt 173 lb 9.6 oz (78.7 kg)   BMI 31.75 kg/m      Assessment & Plan:  [name] and Arthritis   Diagnosis and orders addressed:  1. Need for immunization against influenza - Flu Vaccine QUAD 48mo+IM (Fluarix, Fluzone & Alfiuria Quad PF)  2. Primary hypertension  3. Gastroesophageal reflux disease without esophagitis  4. Primary  osteoarthritis of both knees  - traMADol (ULTRAM) 50 MG tablet; Take 1-2 tablets (50-100 mg total) by mouth every 6 (six) hours as needed. for pain  Dispense: 120 tablet; Refill: 2 - ToxASSURE Select 13 (MW), Urine  5. Pure hypercholesterolemia  6. Vitamin D deficiency  7. Controlled substance agreement signed - traMADol (ULTRAM) 50 MG tablet; Take 1-2 tablets (50-100 mg total) by mouth every 6 (six) hours as needed. for pain  Dispense: 120 tablet; Refill: 2 - ToxASSURE Select 13 (MW), Urine  8. Chronic midline low back pain without sciatica - traMADol (ULTRAM) 50 MG tablet; Take 1-2 tablets (50-100 mg total) by mouth every 6 (six) hours as needed. for pain  Dispense: 120 tablet; Refill: 2  9. Peripheral edema Compression hose daily Low salt diet Keep elevated when possible  - Compression stockings   Labs pending Patient reviewed in [place] controlled database, no flags noted. Contract and drug screen are up dated today.  Health Maintenance reviewed Diet and exercise encouraged  Follow up plan: 3 months    5mo, FNP

## 2021-06-23 LAB — TOXASSURE SELECT 13 (MW), URINE

## 2021-06-29 ENCOUNTER — Other Ambulatory Visit: Payer: Self-pay | Admitting: Family

## 2021-06-29 DIAGNOSIS — K219 Gastro-esophageal reflux disease without esophagitis: Secondary | ICD-10-CM

## 2021-08-23 ENCOUNTER — Other Ambulatory Visit: Payer: Self-pay | Admitting: Family

## 2021-08-23 DIAGNOSIS — E7800 Pure hypercholesterolemia, unspecified: Secondary | ICD-10-CM

## 2021-08-23 DIAGNOSIS — M170 Bilateral primary osteoarthritis of knee: Secondary | ICD-10-CM

## 2021-08-23 DIAGNOSIS — G8929 Other chronic pain: Secondary | ICD-10-CM

## 2021-08-23 DIAGNOSIS — Z79899 Other long term (current) drug therapy: Secondary | ICD-10-CM

## 2021-08-25 ENCOUNTER — Other Ambulatory Visit (HOSPITAL_COMMUNITY): Payer: Self-pay | Admitting: *Deleted

## 2021-08-25 DIAGNOSIS — D539 Nutritional anemia, unspecified: Secondary | ICD-10-CM

## 2021-08-25 DIAGNOSIS — D509 Iron deficiency anemia, unspecified: Secondary | ICD-10-CM

## 2021-08-30 ENCOUNTER — Other Ambulatory Visit (HOSPITAL_COMMUNITY): Payer: Self-pay

## 2021-08-30 ENCOUNTER — Inpatient Hospital Stay (HOSPITAL_COMMUNITY): Payer: Medicare Other | Attending: Hematology

## 2021-08-30 ENCOUNTER — Other Ambulatory Visit: Payer: Self-pay

## 2021-08-30 DIAGNOSIS — E559 Vitamin D deficiency, unspecified: Secondary | ICD-10-CM

## 2021-08-30 DIAGNOSIS — N189 Chronic kidney disease, unspecified: Secondary | ICD-10-CM | POA: Diagnosis not present

## 2021-08-30 DIAGNOSIS — D509 Iron deficiency anemia, unspecified: Secondary | ICD-10-CM

## 2021-08-30 DIAGNOSIS — D631 Anemia in chronic kidney disease: Secondary | ICD-10-CM | POA: Insufficient documentation

## 2021-08-30 DIAGNOSIS — D539 Nutritional anemia, unspecified: Secondary | ICD-10-CM

## 2021-08-30 DIAGNOSIS — E538 Deficiency of other specified B group vitamins: Secondary | ICD-10-CM | POA: Insufficient documentation

## 2021-08-30 LAB — CBC WITH DIFFERENTIAL/PLATELET
Abs Immature Granulocytes: 0.01 10*3/uL (ref 0.00–0.07)
Basophils Absolute: 0 10*3/uL (ref 0.0–0.1)
Basophils Relative: 0 %
Eosinophils Absolute: 0 10*3/uL (ref 0.0–0.5)
Eosinophils Relative: 1 %
HCT: 32.9 % — ABNORMAL LOW (ref 36.0–46.0)
Hemoglobin: 11.2 g/dL — ABNORMAL LOW (ref 12.0–15.0)
Immature Granulocytes: 0 %
Lymphocytes Relative: 41 %
Lymphs Abs: 2.2 10*3/uL (ref 0.7–4.0)
MCH: 31.4 pg (ref 26.0–34.0)
MCHC: 34 g/dL (ref 30.0–36.0)
MCV: 92.2 fL (ref 80.0–100.0)
Monocytes Absolute: 0.3 10*3/uL (ref 0.1–1.0)
Monocytes Relative: 6 %
Neutro Abs: 2.7 10*3/uL (ref 1.7–7.7)
Neutrophils Relative %: 52 %
Platelets: 255 10*3/uL (ref 150–400)
RBC: 3.57 MIL/uL — ABNORMAL LOW (ref 3.87–5.11)
RDW: 12.4 % (ref 11.5–15.5)
WBC: 5.3 10*3/uL (ref 4.0–10.5)
nRBC: 0 % (ref 0.0–0.2)

## 2021-08-30 LAB — VITAMIN D 25 HYDROXY (VIT D DEFICIENCY, FRACTURES): Vit D, 25-Hydroxy: 64.37 ng/mL (ref 30–100)

## 2021-08-30 LAB — COMPREHENSIVE METABOLIC PANEL
ALT: 19 U/L (ref 0–44)
AST: 22 U/L (ref 15–41)
Albumin: 4.2 g/dL (ref 3.5–5.0)
Alkaline Phosphatase: 61 U/L (ref 38–126)
Anion gap: 8 (ref 5–15)
BUN: 13 mg/dL (ref 8–23)
CO2: 25 mmol/L (ref 22–32)
Calcium: 9.2 mg/dL (ref 8.9–10.3)
Chloride: 107 mmol/L (ref 98–111)
Creatinine, Ser: 0.98 mg/dL (ref 0.44–1.00)
GFR, Estimated: >60 mL/min (ref >60)
Glucose, Bld: 115 mg/dL — ABNORMAL HIGH (ref 70–99)
Potassium: 4.1 mmol/L (ref 3.5–5.1)
Sodium: 140 mmol/L (ref 135–145)
Total Bilirubin: 0.4 mg/dL (ref 0.3–1.2)
Total Protein: 7.6 g/dL (ref 6.5–8.1)

## 2021-08-30 LAB — IRON AND TIBC
Iron: 55 ug/dL (ref 28–170)
Saturation Ratios: 17 % (ref 10.4–31.8)
TIBC: 327 ug/dL (ref 250–450)
UIBC: 272 ug/dL

## 2021-08-30 LAB — FERRITIN: Ferritin: 150 ng/mL (ref 11–307)

## 2021-08-30 LAB — VITAMIN B12: Vitamin B-12: 447 pg/mL (ref 180–914)

## 2021-09-05 NOTE — Progress Notes (Signed)
Kristi Barnes, Kristi Barnes [id number]   CLINIC:  Medical Oncology/Hematology  PCP:  Sharion Balloon, [place] [address] [phone]   REASON FOR VISIT:  Follow-up for normocytic anemia secondary to CKD +/- iron deficiency  CURRENT THERAPY: Oral iron supplementation  INTERVAL HISTORY:  Kristi Barnes 71 y.o. female returns for routine follow-up of her normocytic anemia.  She was last seen by Dr. Simeon Craft such on 02/02/2021.  At today's visit, she reports feeling fairly well.  No recent hospitalizations, surgeries, or changes in baseline health status.  She is taking her daily iron pill and weekly vitamin D.  She is not taking any B12 supplementation at this time.  She complains of occasional leg cramps and headaches.  Otherwise, she is asymptomatic.  She denies any fatigue, pica, chest pain, dyspnea on exertion, lightheadedness, or syncope.  She has not noticed any bright red blood per rectum, melena, epistaxis, or hematemesis.  She has 101% energy and 100% appetite. She endorses that she is maintaining a stable weight.   REVIEW OF SYSTEMS:  Review of Systems  Constitutional:  Negative for appetite change, chills, diaphoresis, fatigue, fever and unexpected weight change.  HENT:   Negative for lump/mass and nosebleeds.   Eyes:  Negative for eye problems.  Respiratory:  Negative for cough, hemoptysis and shortness of breath.   Cardiovascular:  Positive for leg swelling. Negative for chest pain and palpitations.  Gastrointestinal:  Negative for abdominal pain, blood in stool, constipation, diarrhea, nausea and vomiting.  Genitourinary:  Negative for hematuria.   Musculoskeletal:  Positive for arthralgias and myalgias.  Skin:  Positive for itching.  Neurological:  Positive for headaches and numbness. Negative for dizziness and light-headedness.  Hematological:  Does not bruise/bleed easily.     PAST MEDICAL/SURGICAL HISTORY:  Past  Medical History:  Diagnosis Date   Arthritis    GERD (gastroesophageal reflux disease)    Glaucoma    Hyperlipidemia    Vitamin D deficiency    Past Surgical History:  Procedure Laterality Date   bunions Bilateral    COLONOSCOPY  01/2013   Dr. Sherrlyn Hock: Fentanyl 150 mcg/Versed 6 mg, sessile polyp removed from the cecum, tortuous rectosigmoid colon.  Pathology revealed focal hyperplastic change, lymphoid aggregate of second fragment with colonic tissue.  Advised to have another colonoscopy in 5 to 10 years.   COLONOSCOPY WITH PROPOFOL N/A 08/26/2019   Dr. Oneida Alar: INCOMPLETE DUE TO [place]. stool throughout the colon. hemorrhoids noted.   COLONOSCOPY WITH PROPOFOL N/A 02/19/2020   Procedure: COLONOSCOPY WITH PROPOFOL;  Surgeon: Daneil Dolin, MD;  Location: [facility];  Service: Endoscopy;  Laterality: N/A;  2:30pm     SOCIAL HISTORY:  Social History   Socioeconomic History   Marital status: Divorced    Spouse name: Not on file   Number of children: 2   Years of education: 12   Highest education level: High school graduate  Occupational History   Occupation: retired  Tobacco Use   Smoking status: Never   Smokeless tobacco: Never  Scientific laboratory technician Use: Never used  Substance and Sexual Activity   Alcohol use: No   Drug use: No   Sexual activity: Not Currently  Other Topics Concern   Not on file  Social History Narrative   Lives alone. Twin sister lives next door. Lots of family nearby   Social Determinants of Health   Financial Resource Strain: Low  Risk    Difficulty of Paying Living Expenses: Not hard at all  Food Insecurity: No Food Insecurity   Worried About [place] in the Last Year: Never true   Ran Out of Food in the Last Year: Never true  Transportation Needs: No Transportation Needs   Lack of Transportation (Medical): No   Lack of Transportation (Non-Medical): No  Physical Activity: Sufficiently Active   Days of Exercise  per Week: 7 days   Minutes of Exercise per Session: 30 min  Stress: No Stress Concern Present   Feeling of Stress : Only a little  Social Connections: Moderately Integrated   Frequency of Communication with Friends and Family: More than three times a week   Frequency of Social Gatherings with Friends and Family: More than three times a week   Attends Religious Services: More than 4 times per year   Active Member of [employer] or Organizations: Yes   Attends Music therapist: More than 4 times per year   Marital Status: Divorced  Human resources officer Violence: Not At Risk   Fear of Current or Ex-Partner: No   Emotionally Abused: No   Physically Abused: No   Sexually Abused: No    FAMILY HISTORY:  Family History  Problem Relation Age of Onset   Heart attack Mother    Cancer Father        lung   Leukemia Sister    Cancer Brother        back of neck   Cancer Sister        back of neck   Colon cancer Neg Hx     CURRENT MEDICATIONS:  Outpatient Encounter Medications as of 09/06/2021  Medication Sig   amLODipine (NORVASC) 10 MG tablet Take 1 tablet (10 mg total) by mouth daily.   Calcium Carbonate-Vit D-Min (CALCIUM 1200 PO) Take by mouth.   diclofenac Sodium (VOLTAREN) 1 % GEL APPLY TWO GRAMS TOPICALLY FOUR TIMES DAILY   Ferrous Sulfate (IRON) 325 (65 Fe) MG TABS Take by mouth.   Iron-Vitamins (GERITOL) LIQD Take 15 mLs by mouth daily.   latanoprost (XALATAN) 0.005 % ophthalmic solution 1 drop at bedtime.   omeprazole (PRILOSEC) 20 MG capsule TAKE ONE CAPSULE BY MOUTH DAILY   simvastatin (ZOCOR) 40 MG tablet TAKE ONE TABLET BY MOUTH ONCE DAILY   traMADol (ULTRAM) 50 MG tablet Take 1-2 tablets (50-100 mg total) by mouth every 6 (six) hours as needed. for pain   Vitamin D, Ergocalciferol, (DRISDOL) 1.25 MG (50000 UNIT) CAPS capsule TAKE ONE CAPSULE BY MOUTH EVERY 7 DAYS.   vitamin E 180 MG (400 UNITS) capsule Take 400 Units by mouth daily.    No facility-administered  encounter medications on file as of 09/06/2021.    ALLERGIES:  Allergies  Allergen Reactions   Asa [Aspirin] Other (See Comments)    Upset stomach       PHYSICAL EXAM:  ECOG PERFORMANCE STATUS: 1 - Symptomatic but completely ambulatory  There were no vitals filed for this visit. There were no vitals filed for this visit. Physical Exam Constitutional:      Appearance: Normal appearance. She is obese.  HENT:     Head: Normocephalic and atraumatic.     Mouth/Throat:     Mouth: Mucous membranes are moist.  Eyes:     Extraocular Movements: Extraocular movements intact.     Pupils: Pupils are equal, round, and reactive to light.  Cardiovascular:     Rate and Rhythm: Normal rate  and regular rhythm.     Pulses: Normal pulses.     Heart sounds: Normal heart sounds.  Pulmonary:     Effort: Pulmonary effort is normal.     Breath sounds: Normal breath sounds.  Abdominal:     General: Bowel sounds are normal.     Palpations: Abdomen is soft.     Tenderness: There is no abdominal tenderness.  Musculoskeletal:        General: No swelling.     Right lower leg: No edema.     Left lower leg: No edema.  Lymphadenopathy:     Cervical: No cervical adenopathy.  Skin:    General: Skin is warm and dry.  Neurological:     General: No focal deficit present.     Mental Status: She is alert and oriented to person, place, and time.  Psychiatric:        Mood and Affect: Mood normal.        Behavior: Behavior normal.     LABORATORY DATA:  I have reviewed the labs as listed.  CBC    Component Value Date/Time   WBC 5.3 08/30/2021 1258   RBC 3.57 (L) 08/30/2021 1258   HGB 11.2 (L) 08/30/2021 1258   HGB 11.2 05/07/2020 1111   HCT 32.9 (L) 08/30/2021 1258   HCT 33.5 (L) 05/07/2020 1111   PLT 255 08/30/2021 1258   PLT 220 05/07/2020 1111   MCV 92.2 08/30/2021 1258   MCV 89 05/07/2020 1111   MCH 31.4 08/30/2021 1258   MCHC 34.0 08/30/2021 1258   RDW 12.4 08/30/2021 1258   RDW 12.6  05/07/2020 1111   LYMPHSABS 2.2 08/30/2021 1258   LYMPHSABS 1.3 05/07/2020 1111   MONOABS 0.3 08/30/2021 1258   EOSABS 0.0 08/30/2021 1258   EOSABS 0.0 05/07/2020 1111   BASOSABS 0.0 08/30/2021 1258   BASOSABS 0.0 05/07/2020 1111   CMP Latest Ref Rng & Units 08/30/2021 09/22/2020 07/29/2020  Glucose 70 - 99 mg/dL 115(H) 84 86  BUN 8 - 23 mg/dL '13 16 11  ' Creatinine 0.44 - 1.00 mg/dL 0.98 1.04(H) 0.86  Sodium 135 - 145 mmol/L 140 139 138  Potassium 3.5 - 5.1 mmol/L 4.1 4.5 4.2  Chloride 98 - 111 mmol/L 107 104 103  CO2 22 - 32 mmol/L '25 29 26  ' Calcium 8.9 - 10.3 mg/dL 9.2 9.5 9.3  Total Protein 6.5 - 8.1 g/dL 7.6 7.6 7.9  Total Bilirubin 0.3 - 1.2 mg/dL 0.4 0.3 0.4  Alkaline Phos 38 - 126 U/L 61 56 54  AST 15 - 41 U/L '22 20 18  ' ALT 0 - 44 U/L '19 15 15    ' DIAGNOSTIC IMAGING:  I have independently reviewed the relevant imaging and discussed with the patient.  ASSESSMENT & PLAN: 1.  Normocytic anemia +/- iron deficiency - Chronic problem since birth, per patient report - Hgb intermittently low ranging from 11.0 to normal with normal MCV - Colonoscopy (01/30/2020) showed no worrisome findings - SPEP negative.  CRP and ESR negative as well. - She is taking ferrous sulfate 134 mg (27 milligrams elemental iron) daily.   - She has never required IV iron. - No signs of bleeding such as bright red blood per rectum or melena - She has some leg cramps and occasional headaches.  Otherwise asymptomatic. - Most recent labs (08/30/2021): Hgb 11.2/MCV 92.2.  Patient has fluctuating kidney function, but most recent CMP shows normal creatinine 0.98 with GFR > 60.  Ferritin 150 with iron saturation  17%. - PLAN: Recommended to patient that she switch to ferrous sulfate 325 mg (65 mg elemental iron), available over-the-counter. - We will repeat labs in 6 months.  2.  Vitamin B12 deficiency - Most recent vitamin B12 (08/30/2021) normal at 447 - She is not taking any vitamin B12  - PLAN: No indication for  vitamin B12 supplementation at this time.  3.  Vitamin D deficiency -The patient was previously taking vitamin D 10,000 units daily - Labs from 09/22/2020 showed elevated vitamin D at 106.75 - She is currently taking vitamin D 50,000 units weekly - Most recent vitamin D (08/30/2021) WNL at 64.37 - PLAN: Continue vitamin D 50,000 units weekly.  We will check levels at next visit in 6 months.  4.  Other history - Non-smoker - No personal history of cancer - Family history of several passing away from lung cancer.  Patient's sister had leukemia at age 32   PLAN SUMMARY & DISPOSITION: Labs in 6 months RTC after labs  All questions were answered. The patient knows to call the clinic with any problems, questions or concerns.  Medical decision making: Low  Time spent on visit: I spent 15 minutes counseling the patient face to face. The total time spent in the appointment was 20 minutes and more than 50% was on counseling.   Harriett Rush, PA-C  09/06/2021 2:55 PM

## 2021-09-06 ENCOUNTER — Encounter (HOSPITAL_COMMUNITY): Payer: Self-pay | Admitting: Physician Assistant

## 2021-09-06 ENCOUNTER — Other Ambulatory Visit: Payer: Self-pay

## 2021-09-06 ENCOUNTER — Inpatient Hospital Stay (HOSPITAL_BASED_OUTPATIENT_CLINIC_OR_DEPARTMENT_OTHER): Payer: Medicare Other | Admitting: Physician Assistant

## 2021-09-06 VITALS — BP 134/70 | HR 66 | Temp 98.4°F | Resp 16 | Ht 62.0 in | Wt 170.5 lb

## 2021-09-06 DIAGNOSIS — D513 Other dietary vitamin B12 deficiency anemia: Secondary | ICD-10-CM

## 2021-09-06 DIAGNOSIS — D509 Iron deficiency anemia, unspecified: Secondary | ICD-10-CM

## 2021-09-06 DIAGNOSIS — N189 Chronic kidney disease, unspecified: Secondary | ICD-10-CM | POA: Diagnosis not present

## 2021-09-06 DIAGNOSIS — E559 Vitamin D deficiency, unspecified: Secondary | ICD-10-CM | POA: Diagnosis not present

## 2021-09-06 NOTE — Patient Instructions (Signed)
[facility] at [facility] Discharge Instructions  You were seen today by Rojelio Brenner PA-C for your low iron levels.   IRON:  Your levels are good.  Make sure that your over-the-counter iron is "ferrous sulfate 325 mg" - this may also be written as "65 mg iron." You can ask the pharmacist for help picking the right dose.  VITAMIN D:  Take 1 capsule (50,000 units) once each week - every Sunday before church.  LABS: Return in 6 months for repeat labs   FOLLOW-UP APPOINTMENT: Office visit in 6 months, after labs   Thank you for choosing [facility] at [facility] to provide your oncology and hematology care.  To afford each patient quality time with our provider, please arrive at least 15 minutes before your scheduled appointment time.   If you have a lab appointment with the Cancer Center please come in thru the Main Entrance and check in at the main information desk.  You need to re-schedule your appointment should you arrive 10 or more minutes late.  We strive to give you quality time with our providers, and arriving late affects you and other patients whose appointments are after yours.  Also, if you no show three or more times for appointments you may be dismissed from the clinic at the providers discretion.     Again, thank you for choosing [facility].  Our hope is that these requests will decrease the amount of time that you wait before being seen by our physicians.       _____________________________________________________________  Should you have questions after your visit to [facility], please contact our office at [phone] and follow the prompts.  Our office hours are 8:00 a.m. and 4:30 p.m. Monday - Friday.  Please note that voicemails left after 4:00 p.m. may not be returned until the following business day.  We are closed weekends and major holidays.  You do have access to a nurse 24-7, just call  the main number to the clinic [phone] and do not press any options, hold on the line and a nurse will answer the phone.    For prescription refill requests, have your pharmacy contact our office and allow 72 hours.    Due to Covid, you will need to wear a mask upon entering the hospital. If you do not have a mask, a mask will be given to you at the Main Entrance upon arrival. For doctor visits, patients may have 1 support person age 69 or older with them. For treatment visits, patients can not have anyone with them due to social distancing guidelines and our immunocompromised population.

## 2021-09-16 ENCOUNTER — Encounter: Payer: Self-pay | Admitting: Family

## 2021-09-16 ENCOUNTER — Ambulatory Visit (INDEPENDENT_AMBULATORY_CARE_PROVIDER_SITE_OTHER): Payer: Medicare Other | Admitting: Family

## 2021-09-16 VITALS — BP 136/72 | HR 79 | Temp 97.3°F | Wt 171.8 lb

## 2021-09-16 DIAGNOSIS — K5900 Constipation, unspecified: Secondary | ICD-10-CM

## 2021-09-16 DIAGNOSIS — E559 Vitamin D deficiency, unspecified: Secondary | ICD-10-CM | POA: Diagnosis not present

## 2021-09-16 DIAGNOSIS — Z23 Encounter for immunization: Secondary | ICD-10-CM

## 2021-09-16 DIAGNOSIS — E7800 Pure hypercholesterolemia, unspecified: Secondary | ICD-10-CM | POA: Diagnosis not present

## 2021-09-16 DIAGNOSIS — I10 Essential (primary) hypertension: Secondary | ICD-10-CM

## 2021-09-16 DIAGNOSIS — K219 Gastro-esophageal reflux disease without esophagitis: Secondary | ICD-10-CM

## 2021-09-16 DIAGNOSIS — M170 Bilateral primary osteoarthritis of knee: Secondary | ICD-10-CM

## 2021-09-16 DIAGNOSIS — M545 Low back pain, unspecified: Secondary | ICD-10-CM

## 2021-09-16 DIAGNOSIS — G8929 Other chronic pain: Secondary | ICD-10-CM

## 2021-09-16 DIAGNOSIS — D539 Nutritional anemia, unspecified: Secondary | ICD-10-CM

## 2021-09-16 DIAGNOSIS — Z79899 Other long term (current) drug therapy: Secondary | ICD-10-CM

## 2021-09-16 MED ORDER — TRAMADOL HCL 50 MG PO TABS: 50.0000 mg | ORAL_TABLET | Freq: Four times a day (QID) | ORAL | 2 refills | Status: DC | PRN

## 2021-09-16 NOTE — Patient Instructions (Signed)
Health Maintenance After Age 69 After age 69, you are at a higher risk for certain long-term diseases and infections as well as injuries from falls. Falls are a major cause of broken bones and head injuries in people who are older than age 69. Getting regular preventive care can help to keep you healthy and well. Preventive care includes getting regular testing and making lifestyle changes as recommended by your health care provider. Talk with your health care provider about: Which screenings and tests you should have. A screening is a test that checks for a disease when you have no symptoms. A diet and exercise plan that is right for you. What should I know about screenings and tests to prevent falls? Screening and testing are the best ways to find a health problem early. Early diagnosis and treatment give you the best chance of managing medical conditions that are common after age 69. Certain conditions and lifestyle choices may make you more likely to have a fall. Your health care provider may recommend: Regular vision checks. Poor vision and conditions such as cataracts can make you more likely to have a fall. If you wear glasses, make sure to get your prescription updated if your vision changes. Medicine review. Work with your health care provider to regularly review all of the medicines you are taking, including over-the-counter medicines. Ask your health care provider about any side effects that may make you more likely to have a fall. Tell your health care provider if any medicines that you take make you feel dizzy or sleepy. Strength and balance checks. Your health care provider may recommend certain tests to check your strength and balance while standing, walking, or changing positions. Foot health exam. Foot pain and numbness, as well as not wearing proper footwear, can make you more likely to have a fall. Screenings, including: Osteoporosis screening. Osteoporosis is a condition that causes  the bones to get weaker and break more easily. Blood pressure screening. Blood pressure changes and medicines to control blood pressure can make you feel dizzy. Depression screening. You may be more likely to have a fall if you have a fear of falling, feel depressed, or feel unable to do activities that you used to do. Alcohol use screening. Using too much alcohol can affect your balance and may make you more likely to have a fall. Follow these instructions at home: Lifestyle Do not drink alcohol if: Your health care provider tells you not to drink. If you drink alcohol: Limit how much you have to: 0-1 drink a day for women. 0-2 drinks a day for men. Know how much alcohol is in your drink. In the [place] one drink equals one 12 oz bottle of beer (355 mL), one 5 oz glass of wine (148 mL), or one 1 oz glass of hard liquor (44 mL). Do not use any products that contain nicotine or tobacco. These products include cigarettes, chewing tobacco, and vaping devices, such as e-cigarettes. If you need help quitting, ask your health care provider. Activity  Follow a regular exercise program to stay fit. This will help you maintain your balance. Ask your health care provider what types of exercise are appropriate for you. If you need a cane or walker, use it as recommended by your health care provider. Wear supportive shoes that have nonskid soles. Safety  Remove any tripping hazards, such as rugs, cords, and clutter. Install safety equipment such as grab bars in bathrooms and safety rails on stairs. Keep rooms and walkways   well-lit. General instructions Talk with your health care provider about your risks for falling. Tell your health care provider if: You fall. Be sure to tell your health care provider about all falls, even ones that seem minor. You feel dizzy, tiredness (fatigue), or off-balance. Take over-the-counter and prescription medicines only as told by your health care provider. These include  supplements. Eat a healthy diet and maintain a healthy weight. A healthy diet includes low-fat dairy products, low-fat (lean) meats, and fiber from whole grains, beans, and lots of fruits and vegetables. Stay current with your vaccines. Schedule regular health, dental, and eye exams. Summary Having a healthy lifestyle and getting preventive care can help to protect your health and wellness after age 69. Screening and testing are the best way to find a health problem early and help you avoid having a fall. Early diagnosis and treatment give you the best chance for managing medical conditions that are more common for people who are older than age 69. Falls are a major cause of broken bones and head injuries in people who are older than age 69. Take precautions to prevent a fall at home. Work with your health care provider to learn what changes you can make to improve your health and wellness and to prevent falls. This information is not intended to replace advice given to you by your health care provider. Make sure you discuss any questions you have with your health care provider. Document Revised: 01/03/2021 Document Reviewed: 01/03/2021 Elsevier Patient Education  2022 Elsevier Inc.  

## 2021-09-16 NOTE — Progress Notes (Signed)
Subjective:    Patient ID: Kristi Barnes, female    DOB: 12/30/1950, 71 y.o.   MRN: [id number]  Chief Complaint  Patient presents with   Medical Management of Chronic Issues   PT presents to the office today for chronic follow up. She is followed by Hematologists every 6 months for iron deficiency anemia.  Hypertension This is a chronic problem. The current episode started more than 1 year ago. The problem has been waxing and waning since onset. The problem is uncontrolled. Pertinent negatives include no malaise/fatigue, peripheral edema or shortness of breath. Risk factors for coronary artery disease include dyslipidemia and sedentary lifestyle. The current treatment provides moderate improvement.  Gastroesophageal Reflux She complains of belching and heartburn. This is a chronic problem. The current episode started more than 1 year ago. The problem occurs occasionally. She has tried a PPI for the symptoms. The treatment provided moderate relief.  Arthritis Presents for follow-up visit. She complains of pain and stiffness. The symptoms have been stable. Affected locations include the left knee, right knee, left MCP and right MCP. Her pain is at a severity of 7/10.  Hyperlipidemia This is a chronic problem. The current episode started more than 1 year ago. The problem is controlled. Recent lipid tests were reviewed and are normal. Exacerbating diseases include obesity. Pertinent negatives include no shortness of breath. Current antihyperlipidemic treatment includes statins. The current treatment provides moderate improvement of lipids. Risk factors for coronary artery disease include dyslipidemia, hypertension, a sedentary lifestyle and post-menopausal.  Anemia Presents for follow-up visit. There has been no malaise/fatigue.  Constipation This is a chronic problem. The current episode started more than 1 year ago. The problem has been resolved since onset. She has tried diet changes for the  symptoms. The treatment provided moderate relief.   Pain assessment: Pain location- Arthritis of hands and knees Pain on scale of 1-10- 6 Frequency- intermittently What increases pain-working What makes pain Better-rest and pain medication.  Any change in general medical condition-none  Current opioids rx- Ultram  # meds rx- 120 Effectiveness of current meds-Good Adverse reactions from pain meds-none Morphine equivalent- 28  Pill count performed-No Last drug screen - 06/16/21 ( high risk q67m, moderate risk q71m, low risk yearly ) Urine drug screen today- No Was the [facility] reviewed- Yes  If yes were their any concerning findings? - none   Overdose risk: low No flowsheet data found.   Pain contract signed on: 06/20/21   Review of Systems  Constitutional:  Negative for malaise/fatigue.  Respiratory:  Negative for shortness of breath.   Gastrointestinal:  Positive for constipation and heartburn.  Musculoskeletal:  Positive for arthritis and stiffness.  All other systems reviewed and are negative.     Objective:   Physical Exam Vitals reviewed.  Constitutional:      General: She is not in acute distress.    Appearance: She is well-developed.  HENT:     Head: Normocephalic and atraumatic.     Right Ear: Tympanic membrane normal.     Left Ear: Tympanic membrane normal.  Eyes:     Pupils: Pupils are equal, round, and reactive to light.  Neck:     Thyroid: No thyromegaly.  Cardiovascular:     Rate and Rhythm: Normal rate and regular rhythm.     Heart sounds: Normal heart sounds. No murmur heard. Pulmonary:     Effort: Pulmonary effort is normal. No respiratory distress.     Breath sounds: Normal breath sounds. No  wheezing.  Abdominal:     General: Bowel sounds are normal. There is no distension.     Palpations: Abdomen is soft.     Tenderness: There is no abdominal tenderness.  Musculoskeletal:        General: No tenderness. Normal range of motion.     Cervical  back: Normal range of motion and neck supple.  Skin:    General: Skin is warm and dry.  Neurological:     Mental Status: She is alert and oriented to person, place, and time.     Cranial Nerves: No cranial nerve deficit.     Deep Tendon Reflexes: Reflexes are normal and symmetric.  Psychiatric:        Behavior: Behavior normal.        Thought Content: Thought content normal.        Judgment: Judgment normal.      BP (!) 146/85    Pulse 78    Temp (!) 97.3 F (36.3 C) (Temporal)    Wt 171 lb 12.8 oz (77.9 kg)    BMI 31.42 kg/m      Assessment & Plan:  [name] [name]   Diagnosis and orders addressed:  1. Need for pneumococcal vaccination - Pneumococcal conjugate vaccine 20-valent (Prevnar 20)  2. Primary hypertension  3. Pure hypercholesterolemia  4. Vitamin D deficiency  5. Deficiency anemia  6. Controlled substance agreement signed  - traMADol (ULTRAM) 50 MG tablet; Take 1-2 tablets (50-100 mg total) by mouth every 6 (six) hours as needed. for pain  Dispense: 120 tablet; Refill: 2  7. Constipation, unspecified constipation type   8. Gastroesophageal reflux disease without esophagitis  9. Primary osteoarthritis of both knees - traMADol (ULTRAM) 50 MG tablet; Take 1-2 tablets (50-100 mg total) by mouth every 6 (six) hours as needed. for pain  Dispense: 120 tablet; Refill: 2  10. Chronic midline low back pain without sciatica  - traMADol (ULTRAM) 50 MG tablet; Take 1-2 tablets (50-100 mg total) by mouth every 6 (six) hours as needed. for pain  Dispense: 120 tablet; Refill: 2  11. Pneumococcal vaccination given   Labs pending Patient reviewed in  controlled database, no flags noted. Contract and drug screen are up to date.  Health Maintenance reviewed Diet and exercise encouraged  Follow up plan: 3 months   Jannifer Rodney, FNP

## 2021-09-26 ENCOUNTER — Other Ambulatory Visit: Payer: Self-pay | Admitting: Family

## 2021-09-26 DIAGNOSIS — I10 Essential (primary) hypertension: Secondary | ICD-10-CM

## 2021-12-13 ENCOUNTER — Ambulatory Visit (INDEPENDENT_AMBULATORY_CARE_PROVIDER_SITE_OTHER): Payer: Medicare Other | Admitting: Family

## 2021-12-13 ENCOUNTER — Encounter: Payer: Self-pay | Admitting: Family

## 2021-12-13 VITALS — BP 123/69 | HR 83 | Temp 97.7°F | Wt 170.2 lb

## 2021-12-13 DIAGNOSIS — K5900 Constipation, unspecified: Secondary | ICD-10-CM

## 2021-12-13 DIAGNOSIS — Z23 Encounter for immunization: Secondary | ICD-10-CM

## 2021-12-13 DIAGNOSIS — I10 Essential (primary) hypertension: Secondary | ICD-10-CM

## 2021-12-13 DIAGNOSIS — K219 Gastro-esophageal reflux disease without esophagitis: Secondary | ICD-10-CM

## 2021-12-13 DIAGNOSIS — D539 Nutritional anemia, unspecified: Secondary | ICD-10-CM

## 2021-12-13 DIAGNOSIS — E7800 Pure hypercholesterolemia, unspecified: Secondary | ICD-10-CM

## 2021-12-13 DIAGNOSIS — M545 Low back pain, unspecified: Secondary | ICD-10-CM

## 2021-12-13 DIAGNOSIS — Z79899 Other long term (current) drug therapy: Secondary | ICD-10-CM

## 2021-12-13 DIAGNOSIS — M170 Bilateral primary osteoarthritis of knee: Secondary | ICD-10-CM

## 2021-12-13 DIAGNOSIS — E559 Vitamin D deficiency, unspecified: Secondary | ICD-10-CM

## 2021-12-13 DIAGNOSIS — G8929 Other chronic pain: Secondary | ICD-10-CM

## 2021-12-13 MED ORDER — TRAMADOL HCL 50 MG PO TABS: 50.0000 mg | ORAL_TABLET | Freq: Four times a day (QID) | ORAL | 2 refills | Status: DC | PRN

## 2021-12-13 NOTE — Progress Notes (Signed)
? ?Subjective:  ? ? Patient ID: Kristi Barnes, female    DOB: Oct 17, 1950, 71 y.o.   MRN: [id number] ? ?Chief Complaint  ?Patient presents with  ? Medical Management of Chronic Issues  ? ?PT presents to the office today for chronic follow up. She is followed by Hematologists every 6 months for iron deficiency anemia.  ?Hypertension ?This is a chronic problem. The current episode started more than 1 year ago. The problem has been resolved since onset. The problem is controlled. Associated symptoms include peripheral edema. Pertinent negatives include no malaise/fatigue or shortness of breath. Risk factors for coronary artery disease include dyslipidemia, obesity and sedentary lifestyle. The current treatment provides moderate improvement.  ?Gastroesophageal Reflux ?She complains of belching and heartburn. This is a chronic problem. The current episode started more than 1 year ago. The problem occurs occasionally. She has tried a PPI for the symptoms. The treatment provided moderate relief.  ?Hyperlipidemia ?This is a chronic problem. The current episode started more than 1 year ago. The problem is controlled. Recent lipid tests were reviewed and are normal. Exacerbating diseases include obesity. Pertinent negatives include no shortness of breath. Current antihyperlipidemic treatment includes statins. The current treatment provides moderate improvement of lipids. Risk factors for coronary artery disease include dyslipidemia, diabetes mellitus, hypertension, a sedentary lifestyle and post-menopausal.  ?Anemia ?Presents for follow-up visit. There has been no malaise/fatigue.  ?Constipation ?This is a chronic problem. The current episode started more than 1 year ago. The problem has been resolved since onset. Her stool frequency is 1 time per day. Risk factors include obesity. The treatment provided moderate relief.  ?Arthritis ?Presents for follow-up visit. She complains of pain and stiffness. The symptoms have been  stable. Affected locations include the right knee, left knee, left MCP and right MCP.  ? ? ?Current opioids rx- Ultram 50 mg  ?# meds rx- 120  ?Effectiveness of current meds-stable ?Adverse reactions from pain meds-None ?Morphine equivalent- 8 ? ?Pill count performed-No ?Last drug screen - 06/16/21 ?( high risk q56m, moderate risk q33m, low risk yearly ) ?Urine drug screen today- No ?Was the NCCSR reviewed- Yes ? If yes were their any concerning findings? - none ? ?Pain contract signed on: 06/20/21 ? ? ?Review of Systems  ?Constitutional:  Negative for malaise/fatigue.  ?Respiratory:  Negative for shortness of breath.   ?Gastrointestinal:  Positive for constipation and heartburn.  ?Musculoskeletal:  Positive for arthritis and stiffness.  ?All other systems reviewed and are negative. ? ?   ?Objective:  ? Physical Exam ?Vitals reviewed.  ?Constitutional:   ?   General: She is not in acute distress. ?   Appearance: She is well-developed. She is obese.  ?HENT:  ?   Head: Normocephalic and atraumatic.  ?   Right Ear: Tympanic membrane normal.  ?   Left Ear: Tympanic membrane normal.  ?Eyes:  ?   Pupils: Pupils are equal, round, and reactive to light.  ?Neck:  ?   Thyroid: No thyromegaly.  ?Cardiovascular:  ?   Rate and Rhythm: Normal rate and regular rhythm.  ?   Heart sounds: Normal heart sounds. No murmur heard. ?Pulmonary:  ?   Effort: Pulmonary effort is normal. No respiratory distress.  ?   Breath sounds: Normal breath sounds. No wheezing.  ?Abdominal:  ?   General: Bowel sounds are normal. There is no distension.  ?   Palpations: Abdomen is soft.  ?   Tenderness: There is no abdominal tenderness.  ?Musculoskeletal:     ?  General: No tenderness. Normal range of motion.  ?   Cervical back: Normal range of motion and neck supple.  ?Skin: ?   General: Skin is warm and dry.  ?Neurological:  ?   Mental Status: She is alert and oriented to person, place, and time.  ?   Cranial Nerves: No cranial nerve deficit.  ?   Deep  Tendon Reflexes: Reflexes are normal and symmetric.  ?Psychiatric:     ?   Behavior: Behavior normal.     ?   Thought Content: Thought content normal.     ?   Judgment: Judgment normal.  ? ? ? ? ?BP 123/69   Pulse 83   Temp 97.7 ?F (36.5 ?C) (Temporal)   Wt 170 lb 3.2 oz (77.2 kg)   BMI 31.13 kg/m?  ? ?   ?Assessment & Plan:  ?Kristi Barnes [name] ? ? ?Diagnosis and orders addressed: ? ?1. Controlled substance agreement signed ?- traMADol (ULTRAM) 50 MG tablet; Take 1-2 tablets (50-100 mg total) by mouth every 6 (six) hours as needed. for pain  Dispense: 120 tablet; Refill: 2 ? ?2. Primary osteoarthritis of both knees ?- traMADol (ULTRAM) 50 MG tablet; Take 1-2 tablets (50-100 mg total) by mouth every 6 (six) hours as needed. for pain  Dispense: 120 tablet; Refill: 2 ? ?3. Chronic midline low back pain without sciatica ?- traMADol (ULTRAM) 50 MG tablet; Take 1-2 tablets (50-100 mg total) by mouth every 6 (six) hours as needed. for pain  Dispense: 120 tablet; Refill: 2 ? ?4. Primary hypertension ? ?5. Gastroesophageal reflux disease without esophagitis ? ?6. Constipation, unspecified constipation type ? ?7. Deficiency anemia ? ?8. Pure hypercholesterolemia ? ?9. Vitamin D deficiency ? ? ?Patient reviewed in [place] controlled database, no flags noted. Contract and drug screen are up to date.  ?Health Maintenance reviewed ?Diet and exercise encouraged ? ?Follow up plan: ?3 months  ? ?Jannifer Rodneyhristy Drayce Tawil, FNP ? ?

## 2021-12-13 NOTE — Patient Instructions (Signed)
Osteoarthritis  Osteoarthritis is a type of arthritis. It refers to joint pain or joint disease. Osteoarthritis affects tissue that covers the ends of bones in joints (cartilage). Cartilage acts as a cushion between the bones and helps them move smoothly. Osteoarthritis occurs when cartilage in the joints gets worn down. Osteoarthritis is sometimes called "wear and tear" arthritis. Osteoarthritis is the most common form of arthritis. It often occurs in older people. It is a condition that gets worse over time. The joints most often affected by this condition are in the fingers, toes, hips, knees, and spine, including the neck and lower back. What are the causes? This condition is caused by the wearing down of cartilage that covers the ends of bones. What increases the risk? The following factors may make you more likely to develop this condition: Being age 50 or older. Obesity. Overuse of joints. Past injury of a joint. Past surgery on a joint. Family history of osteoarthritis. What are the signs or symptoms? The main symptoms of this condition are pain, swelling, and stiffness in the joint. Other symptoms may include: An enlarged joint. More pain and further damage caused by small pieces of bone or cartilage that break off and float inside of the joint. Small deposits of bone (osteophytes) that grow on the edges of the joint. A grating or scraping feeling inside the joint when you move it. Popping or creaking sounds when you move. Difficulty walking or exercising. An inability to grip items, twist your hand(s), or control the movements of your hands and fingers. How is this diagnosed? This condition may be diagnosed based on: Your medical history. A physical exam. Your symptoms. X-rays of the affected joint(s). Blood tests to rule out other types of arthritis. How is this treated? There is no cure for this condition, but treatment can help control pain and improve joint function.  Treatment may include a combination of therapies, such as: Pain relief techniques, such as: Applying heat and cold to the joint. Massage. A form of talk therapy called cognitive behavioral therapy (CBT). This therapy helps you set goals and follow up on the changes that you make. Medicines for pain and inflammation. The medicines can be taken by mouth or applied to the skin. They include: NSAIDs, such as ibuprofen. Prescription medicines. Strong anti-inflammatory medicines (corticosteroids). Certain nutritional supplements. A prescribed exercise program. You may work with a physical therapist. Assistive devices, such as a brace, wrap, splint, specialized glove, or cane. A weight control plan. Surgery, such as: An osteotomy. This is done to reposition the bones and relieve pain or to remove loose pieces of bone and cartilage. Joint replacement surgery. You may need this surgery if you have advanced osteoarthritis. Follow these instructions at home: Activity Rest your affected joints as told by your health care provider. Exercise as told by your health care provider. He or she may recommend specific types of exercise, such as: Strengthening exercises. These are done to strengthen the muscles that support joints affected by arthritis. Aerobic activities. These are exercises, such as brisk walking or water aerobics, that increase your heart rate. Range-of-motion activities. These help your joints move more easily. Balance and agility exercises. Managing pain, stiffness, and swelling     If directed, apply heat to the affected area as often as told by your health care provider. Use the heat source that your health care provider recommends, such as a moist heat pack or a heating pad. If you have a removable assistive device, remove it   as told by your health care provider. Place a towel between your skin and the heat source. If your health care provider tells you to keep the assistive device  on while you apply heat, place a towel between the assistive device and the heat source. Leave the heat on for 20-30 minutes. Remove the heat if your skin turns bright red. This is especially important if you are unable to feel pain, heat, or cold. You may have a greater risk of getting burned. If directed, put ice on the affected area. To do this: If you have a removable assistive device, remove it as told by your health care provider. Put ice in a plastic bag. Place a towel between your skin and the bag. If your health care provider tells you to keep the assistive device on during icing, place a towel between the assistive device and the bag. Leave the ice on for 20 minutes, 2-3 times a day. Move your fingers or toes often to reduce stiffness and swelling. Raise (elevate) the injured area above the level of your heart while you are sitting or lying down. General instructions Take over-the-counter and prescription medicines only as told by your health care provider. Maintain a healthy weight. Follow instructions from your health care provider for weight control. Do not use any products that contain nicotine or tobacco, such as cigarettes, e-cigarettes, and chewing tobacco. If you need help quitting, ask your health care provider. Use assistive devices as told by your health care provider. Keep all follow-up visits as told by your health care provider. This is important. Where to find more information National Institute of Arthritis and Musculoskeletal and Skin Diseases: www.niams.nih.gov National Institute on Aging: www.nia.nih.gov American College of Rheumatology: www.rheumatology.org Contact a health care provider if: You have redness, swelling, or a feeling of warmth in a joint that gets worse. You have a fever along with joint or muscle aches. You develop a rash. You have trouble doing your normal activities. Get help right away if: You have pain that gets worse and is not relieved by  pain medicine. Summary Osteoarthritis is a type of arthritis that affects tissue covering the ends of bones in joints (cartilage). This condition is caused by the wearing down of cartilage that covers the ends of bones. The main symptom of this condition is pain, swelling, and stiffness in the joint. There is no cure for this condition, but treatment can help control pain and improve joint function. This information is not intended to replace advice given to you by your health care provider. Make sure you discuss any questions you have with your health care provider. Document Revised: 08/11/2019 Document Reviewed: 08/11/2019 Elsevier Patient Education  2023 [employer] Inc.  

## 2022-01-19 ENCOUNTER — Ambulatory Visit (INDEPENDENT_AMBULATORY_CARE_PROVIDER_SITE_OTHER): Payer: Medicare Other

## 2022-01-19 VITALS — Wt 170.0 lb

## 2022-01-19 DIAGNOSIS — Z0000 Encounter for general adult medical examination without abnormal findings: Secondary | ICD-10-CM

## 2022-01-19 NOTE — Progress Notes (Signed)
Subjective:   Kristi Barnes is a 71 y.o. female who presents for Medicare Annual (Subsequent) preventive examination.  Virtual Visit via Telephone Note  I connected with  Kristi Barnes on 01/19/22 at 10:30 AM EDT by telephone and verified that I am speaking with the correct person using two identifiers.  Location: Patient: Home Provider: [facility] Persons participating in the virtual visit: patient/Nurse Health Advisor   I discussed the limitations, risks, security and privacy concerns of performing an evaluation and management service by telephone and the availability of in person appointments. The patient expressed understanding and agreed to proceed.  Interactive audio and video telecommunications were attempted between this nurse and patient, however failed, due to patient having technical difficulties OR patient did not have access to video capability.  We continued and completed visit with audio only.  Some vital signs may be absent or patient reported.   Kristi Barnes E Kyuss Hale, LPN   Review of Systems     Cardiac Risk Factors include: advanced age (>64men, >52 women);dyslipidemia;hypertension;sedentary lifestyle;obesity (BMI >30kg/m2);Other (see comment), Risk factor comments: anemia     Objective:    Today's Vitals   01/19/22 1030  Weight: 170 lb (77.1 kg)  PainSc: 8    Body mass index is 31.09 kg/m.     01/19/2022   10:41 AM 09/06/2021    1:49 PM 02/02/2021    1:20 PM 01/18/2021   10:14 AM 10/26/2020    2:48 PM 08/18/2020   10:58 AM 07/29/2020   11:38 AM  Advanced Directives  Does Patient Have a Medical Advance Directive? No No No No No No No  Would patient like information on creating a medical advance directive? No - Patient declined No - Patient declined No - Patient declined No - Patient declined No - Patient declined No - Patient declined No - Patient declined    Current Medications (verified) Outpatient Encounter Medications as of 01/19/2022  Medication Sig    amLODipine (NORVASC) 10 MG tablet TAKE ONE TABLET BY MOUTH ONCE DAILY   Calcium Carbonate-Vit D-Min (CALCIUM 1200 PO) Take by mouth.   diclofenac Sodium (VOLTAREN) 1 % GEL APPLY TWO GRAMS TOPICALLY FOUR TIMES DAILY   Ferrous Sulfate (IRON) 325 (65 Fe) MG TABS Take by mouth.   latanoprost (XALATAN) 0.005 % ophthalmic solution 1 drop at bedtime.   omeprazole (PRILOSEC) 20 MG capsule TAKE ONE CAPSULE BY MOUTH DAILY   simvastatin (ZOCOR) 40 MG tablet TAKE ONE TABLET BY MOUTH ONCE DAILY   traMADol (ULTRAM) 50 MG tablet Take 1-2 tablets (50-100 mg total) by mouth every 6 (six) hours as needed. for pain   Vitamin D, Ergocalciferol, (DRISDOL) 1.25 MG (50000 UNIT) CAPS capsule TAKE ONE CAPSULE BY MOUTH EVERY 7 DAYS.   vitamin E 180 MG (400 UNITS) capsule Take 400 Units by mouth daily.    No facility-administered encounter medications on file as of 01/19/2022.    Allergies (verified) Asa [aspirin]   History: Past Medical History:  Diagnosis Date   Arthritis    GERD (gastroesophageal reflux disease)    Glaucoma    Hyperlipidemia    Vitamin D deficiency    Past Surgical History:  Procedure Laterality Date   bunions Bilateral    COLONOSCOPY  01/2013   Dr. Mikal Plane: Fentanyl 150 mcg/Versed 6 mg, sessile polyp removed from the cecum, tortuous rectosigmoid colon.  Pathology revealed focal hyperplastic change, lymphoid aggregate of second fragment with colonic tissue.  Advised to have another colonoscopy in 5 to 10 years.  COLONOSCOPY WITH PROPOFOL N/A 08/26/2019   Dr. Darrick Penna: INCOMPLETE DUE TO INADEQUATE BOWEL PREP. stool throughout the colon. hemorrhoids noted.   COLONOSCOPY WITH PROPOFOL N/A 02/19/2020   Procedure: COLONOSCOPY WITH PROPOFOL;  Surgeon: Corbin Ade, MD;  Location: [facility];  Service: Endoscopy;  Laterality: N/A;  2:30pm   Family History  Problem Relation Age of Onset   Heart attack Mother    Cancer Father        lung   Leukemia Sister    Cancer Brother         back of neck   Cancer Sister        back of neck   Colon cancer Neg Hx    Social History   Socioeconomic History   Marital status: Divorced    Spouse name: Not on file   Number of children: 2   Years of education: 12   Highest education level: High school graduate  Occupational History   Occupation: retired  Tobacco Use   Smoking status: Never   Smokeless tobacco: Never  Building services engineer Use: Never used  Substance and Sexual Activity   Alcohol use: No   Drug use: No   Sexual activity: Not Currently  Other Topics Concern   Not on file  Social History Narrative   Lives alone. Twin sister lives next door. Lots of family nearby   Social Determinants of Health   Financial Resource Strain: Low Risk    Difficulty of Paying Living Expenses: Not hard at all  Food Insecurity: No Food Insecurity   Worried About Programme researcher, broadcasting/film/video in the Last Year: Never true   Barista in the Last Year: Never true  Transportation Needs: No Transportation Needs   Lack of Transportation (Medical): No   Lack of Transportation (Non-Medical): No  Physical Activity: Sufficiently Active   Days of Exercise per Week: 7 days   Minutes of Exercise per Session: 30 min  Stress: Stress Concern Present   Feeling of Stress : To some extent  Social Connections: Moderately Integrated   Frequency of Communication with Friends and Family: More than three times a week   Frequency of Social Gatherings with Friends and Family: More than three times a week   Attends Religious Services: More than 4 times per year   Active Member of [employer] or Organizations: Yes   Attends Engineer, structural: More than 4 times per year   Marital Status: Divorced    Tobacco Counseling Counseling given: Not Answered   Clinical Intake:  Pre-visit preparation completed: Yes  Pain : 0-10 Pain Score: 8  Pain Type: Chronic pain Pain Location: Hand Pain Orientation: Right Pain Descriptors / Indicators: Aching,  Numbness, Tender Pain Onset: More than a month ago Pain Frequency: Intermittent     BMI - recorded: 31.09 Nutritional Status: BMI > 30  Obese Nutritional Risks: None Diabetes: No  How often do you need to have someone help you when you read instructions, pamphlets, or other written materials from your doctor or pharmacy?: 1 - Never  Diabetic? no  Interpreter Needed?: No  Information entered by :: Johnda Billiot, LPN   Activities of Daily Living    01/19/2022   10:36 AM  In your present state of health, do you have any difficulty performing the following activities:  Hearing? 0  Vision? 1  Comment glaucoma  Difficulty concentrating or making decisions? 0  Walking or climbing stairs? 0  Dressing or bathing?  0  Doing errands, shopping? 0  Preparing Food and eating ? N  Using the Toilet? N  In the past six months, have you accidently leaked urine? Y  Comment mild  Do you have problems with loss of bowel control? N  Managing your Medications? N  Managing your Finances? N  Housekeeping or managing your Housekeeping? N    Patient Care Team: Junie Spencer, FNP as PCP - General (Family Medicine) West Bali, MD (Inactive) as Consulting Physician (Gastroenterology)  Indicate any recent Medical Services you may have received from other than [facility] providers in the past year (date may be approximate).     Assessment:   This is a routine wellness examination for [facility].  Hearing/Vision screen Hearing Screening - Comments:: Denies hearing difficulties   Vision Screening - Comments:: Wears rx glasses - up to date with routine eye exams with [facility] Pl[facility] in [place] - she has glaucoma - still can't see well enough to drive  Dietary issues and exercise activities discussed: Current Exercise Habits: Home exercise routine, Type of exercise: walking, Time (Minutes): 30, Frequency (Times/Week): 7, Weekly Exercise (Minutes/Week): 210, Intensity: Mild, Exercise limited by: orthopedic  condition(s)   Goals Addressed             This Visit's Progress    DIET - INCREASE WATER INTAKE   On track    Try to drink 6-8 glasses of water daily       Depression Screen    01/19/2022   10:35 AM 12/13/2021    2:13 PM 06/16/2021    1:49 PM 01/18/2021   10:08 AM 12/17/2020    2:36 PM 11/25/2020   11:35 AM 11/09/2020   10:27 AM  PHQ 2/9 Scores  PHQ - 2 Score 2 0 0 0 0 0 0  PHQ- 9 Score 4 0         Fall Risk    01/19/2022   10:32 AM 06/16/2021    1:49 PM 01/18/2021   10:14 AM 12/17/2020    2:36 PM 11/25/2020   11:35 AM  Fall Risk   Falls in the past year? 1 0 0 0 0  Number falls in past yr: 1  0    Injury with Fall? 0  0    Risk for fall due to : History of fall(s);Impaired balance/gait;Orthopedic patient  Orthopedic patient;Medication side effect;Impaired vision    Follow up Education provided;Falls prevention discussed  Education provided;Falls prevention discussed      FALL RISK PREVENTION PERTAINING TO THE HOME:  Any stairs in or around the home? No  If so, are there any without handrails? No  Home free of loose throw rugs in walkways, pet beds, electrical cords, etc? Yes  Adequate lighting in your home to reduce risk of falls? Yes   ASSISTIVE DEVICES UTILIZED TO PREVENT FALLS:  Life alert? No  Use of a cane, walker or w/c? No  Grab bars in the bathroom? Yes  Shower chair or bench in shower? No  Elevated toilet seat or a handicapped toilet? No   TIMED UP AND GO:  Was the test performed? No . Telephonic visit  Cognitive Function:        01/19/2022   10:39 AM 11/05/2019   10:22 AM  6CIT Screen  What Year? 0 points 0 points  What month? 0 points 0 points  What time? 0 points 0 points  Count back from 20 0 points 0 points  Months in reverse 4 points 4 points  Repeat phrase 4 points 2 points  Total Score 8 points 6 points    Immunizations Immunization History  Administered Date(s) Administered   Influenza,inj,Quad PF,6+ Mos 06/16/2021    Influenza-Unspecified 06/02/2020   PNEUMOCOCCAL CONJUGATE-20 09/16/2021   Pneumococcal Conjugate-13 08/10/2020   Td 08/18/1999   Tdap 12/13/2021    TDAP status: Up to date  Flu Vaccine status: Up to date  Pneumococcal vaccine status: Up to date  Covid-19 vaccine status: Declined, Education has been provided regarding the importance of this vaccine but patient still declined. Advised may receive this vaccine at local pharmacy or Health Dept.or vaccine clinic. Aware to provide a copy of the vaccination record if obtained from local pharmacy or Health Dept. Verbalized acceptance and understanding.  Qualifies for Shingles Vaccine? Yes   Zostavax completed No   Shingrix Completed?: No.    Education has been provided regarding the importance of this vaccine. Patient has been advised to call insurance company to determine out of pocket expense if they have not yet received this vaccine. Advised may also receive vaccine at local pharmacy or Health Dept. Verbalized acceptance and understanding.  Screening Tests Health Maintenance  Topic Date Due   Zoster Vaccines- Shingrix (1 of 2) 03/14/2022 (Originally 01/19/1970)   MAMMOGRAM  02/01/2022   INFLUENZA VACCINE  03/28/2022   DEXA SCAN  08/13/2022   COLONOSCOPY (Pts 45-7yrs Insurance coverage will need to be confirmed)  02/18/2030   TETANUS/TDAP  12/14/2031   Pneumonia Vaccine 20+ Years old  Completed   Hepatitis C Screening  Completed   HPV VACCINES  Aged Out   COVID-19 Vaccine  Discontinued    Health Maintenance  There are no preventive care reminders to display for this patient.  Colorectal cancer screening: Type of screening: Colonoscopy. Completed 02/19/2020. Repeat every 5 years  Mammogram status: Completed 02/01/2021. Repeat every year - She has appointment at [facility] in [place] 02/07/2022 @ 1:40  Bone Density status: Completed 08/13/2020. Results reflect: Bone density results: OSTEOPENIA. Repeat every 2 years.  Lung  Cancer Screening: (Low Dose CT Chest recommended if Age 28-80 years, 30 pack-year currently smoking OR have quit w/in 15years.) does not qualify.  Additional Screening:  Hepatitis C Screening: does qualify; Completed 05/07/2020  Vision Screening: Recommended annual ophthalmology exams for early detection of glaucoma and other disorders of the eye. Is the patient up to date with their annual eye exam?  Yes  Who is the provider or what is the name of the office in which the patient attends annual eye exams? [facility] in [place] If pt is not established with a provider, would they like to be referred to a provider to establish care? No .   Dental Screening: Recommended annual dental exams for proper oral hygiene  Community Resource Referral / Chronic Care Management: CRR required this visit?  No   CCM required this visit?  No      Plan:     I have personally reviewed and noted the following in the patient's chart:   Medical and social history Use of alcohol, tobacco or illicit drugs  Current medications and supplements including opioid prescriptions.  Functional ability and status Nutritional status Physical activity Advanced directives List of other physicians Hospitalizations, surgeries, and ER visits in previous 12 months Vitals Screenings to include cognitive, depression, and falls Referrals and appointments  In addition, I have reviewed and discussed with patient certain preventive protocols, quality metrics, and best practice recommendations. A written personalized care plan for preventive services as  well as general preventive health recommendations were provided to patient.     [place] Constable, LPN   1/61/0960   Nurse Notes: He 6CIT score =8 today, but she says her memory is usually fine; she is just under a lot of stress right now due to sickness and deaths in her family lately.

## 2022-01-19 NOTE — Patient Instructions (Signed)
Kristi Barnes , Thank you for taking time to come for your Medicare Wellness Visit. I appreciate your ongoing commitment to your health goals. Please review the following plan we discussed and let me know if I can assist you in the future.   Screening recommendations/referrals: Colonoscopy: Done 02/19/2020 - Repeat in 5 years Mammogram: Done 02/01/2021 - Repeat annually *keep appointment at [facility] 02/07/22 @ 1:40 Bone Density: Done 08/13/2020 - Repeat every 2 years Recommended yearly ophthalmology/optometry visit for glaucoma screening and checkup Recommended yearly dental visit for hygiene and checkup  Vaccinations: Influenza vaccine: Done 06/16/2021 - Repeat annually Pneumococcal vaccine: Done 08/10/2020 & 09/16/2021 Tdap vaccine: Done 12/13/2021 - Repeat in 10 years Shingles vaccine: Due - Shingrix is 2 doses 2-6 months apart and over 90% effective     Covid-19:Declined  Advanced directives: Advance directive discussed with you today. Even though you declined this today, please call our office should you change your mind, and we can give you the proper paperwork for you to fill out.   Conditions/risks identified: Aim for 30 minutes of exercise or brisk walking, 6-8 glasses of water, and 5 servings of fruits and vegetables each day.   Next appointment: Follow up in one year for your annual wellness visit     Preventive Care 65 Years and Older, Female Preventive care refers to lifestyle choices and visits with your health care provider that can promote health and wellness. What does preventive care include? A yearly physical exam. This is also called an annual well check. Dental exams once or twice a year. Routine eye exams. Ask your health care provider how often you should have your eyes checked. Personal lifestyle choices, including: Daily care of your teeth and gums. Regular physical activity. Eating a healthy diet. Avoiding tobacco and drug use. Limiting alcohol  use. Practicing safe sex. Taking low-dose aspirin every day. Taking vitamin and mineral supplements as recommended by your health care provider. What happens during an annual well check? The services and screenings done by your health care provider during your annual well check will depend on your age, overall health, lifestyle risk factors, and family history of disease. Counseling  Your health care provider may ask you questions about your: Alcohol use. Tobacco use. Drug use. Emotional well-being. Home and relationship well-being. Sexual activity. Eating habits. History of falls. Memory and ability to understand (cognition). Work and work Astronomer. Reproductive health. Screening  You may have the following tests or measurements: Height, weight, and BMI. Blood pressure. Lipid and cholesterol levels. These may be checked every 5 years, or more frequently if you are over 62 years old. Skin check. Lung cancer screening. You may have this screening every year starting at age 2 if you have a 30-pack-year history of smoking and currently smoke or have quit within the past 15 years. Fecal occult blood test (FOBT) of the stool. You may have this test every year starting at age 10. Flexible sigmoidoscopy or colonoscopy. You may have a sigmoidoscopy every 5 years or a colonoscopy every 10 years starting at age 22. Hepatitis C blood test. Hepatitis B blood test. Sexually transmitted disease (STD) testing. Diabetes screening. This is done by checking your blood sugar (glucose) after you have not eaten for a while (fasting). You may have this done every 1-3 years. Bone density scan. This is done to screen for osteoporosis. You may have this done starting at age 78. Mammogram. This may be done every 1-2 years. Talk to your health care provider  about how often you should have regular mammograms. Talk with your health care provider about your test results, treatment options, and if necessary,  the need for more tests. Vaccines  Your health care provider may recommend certain vaccines, such as: Influenza vaccine. This is recommended every year. Tetanus, diphtheria, and acellular pertussis (Tdap, Td) vaccine. You may need a Td booster every 10 years. Zoster vaccine. You may need this after age 17. Pneumococcal 13-valent conjugate (PCV13) vaccine. One dose is recommended after age 50. Pneumococcal polysaccharide (PPSV23) vaccine. One dose is recommended after age 49. Talk to your health care provider about which screenings and vaccines you need and how often you need them. This information is not intended to replace advice given to you by your health care provider. Make sure you discuss any questions you have with your health care provider. Document Released: 09/10/2015 Document Revised: 05/03/2016 Document Reviewed: 06/15/2015 Elsevier Interactive Patient Education  2017 Major Prevention in the Home Falls can cause injuries. They can happen to people of all ages. There are many things you can do to make your home safe and to help prevent falls. What can I do on the outside of my home? Regularly fix the edges of walkways and driveways and fix any cracks. Remove anything that might make you trip as you walk through a door, such as a raised step or threshold. Trim any bushes or trees on the path to your home. Use bright outdoor lighting. Clear any walking paths of anything that might make someone trip, such as rocks or tools. Regularly check to see if handrails are loose or broken. Make sure that both sides of any steps have handrails. Any raised decks and porches should have guardrails on the edges. Have any leaves, snow, or ice cleared regularly. Use sand or salt on walking paths during winter. Clean up any spills in your garage right away. This includes oil or grease spills. What can I do in the bathroom? Use night lights. Install grab bars by the toilet and in the  tub and shower. Do not use towel bars as grab bars. Use non-skid mats or decals in the tub or shower. If you need to sit down in the shower, use a plastic, non-slip stool. Keep the floor dry. Clean up any water that spills on the floor as soon as it happens. Remove soap buildup in the tub or shower regularly. Attach bath mats securely with double-sided non-slip rug tape. Do not have throw rugs and other things on the floor that can make you trip. What can I do in the bedroom? Use night lights. Make sure that you have a light by your bed that is easy to reach. Do not use any sheets or blankets that are too big for your bed. They should not hang down onto the floor. Have a firm chair that has side arms. You can use this for support while you get dressed. Do not have throw rugs and other things on the floor that can make you trip. What can I do in the kitchen? Clean up any spills right away. Avoid walking on wet floors. Keep items that you use a lot in easy-to-reach places. If you need to reach something above you, use a strong step stool that has a grab bar. Keep electrical cords out of the way. Do not use floor polish or wax that makes floors slippery. If you must use wax, use non-skid floor wax. Do not have throw rugs and  other things on the floor that can make you trip. What can I do with my stairs? Do not leave any items on the stairs. Make sure that there are handrails on both sides of the stairs and use them. Fix handrails that are broken or loose. Make sure that handrails are as long as the stairways. Check any carpeting to make sure that it is firmly attached to the stairs. Fix any carpet that is loose or worn. Avoid having throw rugs at the top or bottom of the stairs. If you do have throw rugs, attach them to the floor with carpet tape. Make sure that you have a light switch at the top of the stairs and the bottom of the stairs. If you do not have them, ask someone to add them for  you. What else can I do to help prevent falls? Wear shoes that: Do not have high heels. Have rubber bottoms. Are comfortable and fit you well. Are closed at the toe. Do not wear sandals. If you use a stepladder: Make sure that it is fully opened. Do not climb a closed stepladder. Make sure that both sides of the stepladder are locked into place. Ask someone to hold it for you, if possible. Clearly mark and make sure that you can see: Any grab bars or handrails. First and last steps. Where the edge of each step is. Use tools that help you move around (mobility aids) if they are needed. These include: Canes. Walkers. Scooters. Crutches. Turn on the lights when you go into a dark area. Replace any light bulbs as soon as they burn out. Set up your furniture so you have a clear path. Avoid moving your furniture around. If any of your floors are uneven, fix them. If there are any pets around you, be aware of where they are. Review your medicines with your doctor. Some medicines can make you feel dizzy. This can increase your chance of falling. Ask your doctor what other things that you can do to help prevent falls. This information is not intended to replace advice given to you by your health care provider. Make sure you discuss any questions you have with your health care provider. Document Released: 06/10/2009 Document Revised: 01/20/2016 Document Reviewed: 09/18/2014 Elsevier Interactive Patient Education  2017 [employer].

## 2022-03-01 ENCOUNTER — Other Ambulatory Visit: Payer: Self-pay | Admitting: Family

## 2022-03-01 DIAGNOSIS — E7800 Pure hypercholesterolemia, unspecified: Secondary | ICD-10-CM

## 2022-03-07 ENCOUNTER — Inpatient Hospital Stay (HOSPITAL_COMMUNITY): Payer: Medicare Other | Attending: Hematology

## 2022-03-07 DIAGNOSIS — E559 Vitamin D deficiency, unspecified: Secondary | ICD-10-CM | POA: Insufficient documentation

## 2022-03-07 DIAGNOSIS — D509 Iron deficiency anemia, unspecified: Secondary | ICD-10-CM | POA: Insufficient documentation

## 2022-03-07 DIAGNOSIS — N189 Chronic kidney disease, unspecified: Secondary | ICD-10-CM | POA: Insufficient documentation

## 2022-03-07 DIAGNOSIS — D631 Anemia in chronic kidney disease: Secondary | ICD-10-CM | POA: Insufficient documentation

## 2022-03-07 DIAGNOSIS — D513 Other dietary vitamin B12 deficiency anemia: Secondary | ICD-10-CM

## 2022-03-07 LAB — CBC WITH DIFFERENTIAL/PLATELET
Abs Immature Granulocytes: 0.01 10*3/uL (ref 0.00–0.07)
Basophils Absolute: 0 10*3/uL (ref 0.0–0.1)
Basophils Relative: 0 %
Eosinophils Absolute: 0 10*3/uL (ref 0.0–0.5)
Eosinophils Relative: 0 %
HCT: 34.4 % — ABNORMAL LOW (ref 36.0–46.0)
Hemoglobin: 11.2 g/dL — ABNORMAL LOW (ref 12.0–15.0)
Immature Granulocytes: 0 %
Lymphocytes Relative: 47 %
Lymphs Abs: 2.2 10*3/uL (ref 0.7–4.0)
MCH: 29.8 pg (ref 26.0–34.0)
MCHC: 32.6 g/dL (ref 30.0–36.0)
MCV: 91.5 fL (ref 80.0–100.0)
Monocytes Absolute: 0.4 10*3/uL (ref 0.1–1.0)
Monocytes Relative: 8 %
Neutro Abs: 2.1 10*3/uL (ref 1.7–7.7)
Neutrophils Relative %: 45 %
Platelets: 256 10*3/uL (ref 150–400)
RBC: 3.76 MIL/uL — ABNORMAL LOW (ref 3.87–5.11)
RDW: 12.2 % (ref 11.5–15.5)
WBC: 4.8 10*3/uL (ref 4.0–10.5)
nRBC: 0 % (ref 0.0–0.2)

## 2022-03-07 LAB — IRON AND TIBC
Iron: 79 ug/dL (ref 28–170)
Saturation Ratios: 25 % (ref 10.4–31.8)
TIBC: 311 ug/dL (ref 250–450)
UIBC: 232 ug/dL

## 2022-03-07 LAB — VITAMIN B12: Vitamin B-12: 558 pg/mL (ref 180–914)

## 2022-03-07 LAB — FERRITIN: Ferritin: 199 ng/mL (ref 11–307)

## 2022-03-07 LAB — FOLATE: Folate: 16.8 ng/mL (ref >5.9)

## 2022-03-07 LAB — VITAMIN D 25 HYDROXY (VIT D DEFICIENCY, FRACTURES): Vit D, 25-Hydroxy: 50.48 ng/mL (ref 30–100)

## 2022-03-08 LAB — HOMOCYSTEINE: Homocysteine: 11.9 umol/L (ref 0.0–17.2)

## 2022-03-09 LAB — METHYLMALONIC ACID, SERUM: Methylmalonic Acid, Quantitative: 134 nmol/L (ref 0–378)

## 2022-03-13 ENCOUNTER — Ambulatory Visit (INDEPENDENT_AMBULATORY_CARE_PROVIDER_SITE_OTHER): Payer: Medicare Other | Admitting: Family

## 2022-03-13 ENCOUNTER — Encounter: Payer: Self-pay | Admitting: Family

## 2022-03-13 VITALS — BP 135/61 | HR 74 | Temp 97.9°F | Ht 62.0 in | Wt 169.0 lb

## 2022-03-13 DIAGNOSIS — E7800 Pure hypercholesterolemia, unspecified: Secondary | ICD-10-CM

## 2022-03-13 DIAGNOSIS — D539 Nutritional anemia, unspecified: Secondary | ICD-10-CM

## 2022-03-13 DIAGNOSIS — I10 Essential (primary) hypertension: Secondary | ICD-10-CM | POA: Diagnosis not present

## 2022-03-13 DIAGNOSIS — K219 Gastro-esophageal reflux disease without esophagitis: Secondary | ICD-10-CM | POA: Diagnosis not present

## 2022-03-13 DIAGNOSIS — M545 Low back pain, unspecified: Secondary | ICD-10-CM

## 2022-03-13 DIAGNOSIS — E559 Vitamin D deficiency, unspecified: Secondary | ICD-10-CM

## 2022-03-13 DIAGNOSIS — Z23 Encounter for immunization: Secondary | ICD-10-CM

## 2022-03-13 DIAGNOSIS — Z79899 Other long term (current) drug therapy: Secondary | ICD-10-CM

## 2022-03-13 DIAGNOSIS — M170 Bilateral primary osteoarthritis of knee: Secondary | ICD-10-CM | POA: Diagnosis not present

## 2022-03-13 DIAGNOSIS — K5900 Constipation, unspecified: Secondary | ICD-10-CM

## 2022-03-13 DIAGNOSIS — G8929 Other chronic pain: Secondary | ICD-10-CM

## 2022-03-13 MED ORDER — DICLOFENAC SODIUM 1 % EX GEL: CUTANEOUS | 2 refills | Status: DC

## 2022-03-13 MED ORDER — AMLODIPINE BESYLATE 10 MG PO TABS: 10.0000 mg | ORAL_TABLET | Freq: Every day | ORAL | 0 refills | Status: DC

## 2022-03-13 MED ORDER — TRAMADOL HCL 50 MG PO TABS: 50.0000 mg | ORAL_TABLET | Freq: Four times a day (QID) | ORAL | 2 refills | Status: DC | PRN

## 2022-03-13 NOTE — Addendum Note (Signed)
Addended by: Adella Hare B on: 03/13/2022 01:03 PM   Modules accepted: Orders

## 2022-03-13 NOTE — Progress Notes (Signed)
Subjective:    Patient ID: Kristi Barnes, female    DOB: 1950-12-10, 71 y.o.   MRN: [id number]  Chief Complaint  Patient presents with   Medical Management of Chronic Issues   PT presents to the office today for chronic follow up. She is followed by Hematologists every 6 months for iron deficiency anemia.   She lost her sister last week and is dealing with this.  Hypertension This is a chronic problem. The current episode started more than 1 year ago. The problem has been resolved since onset. The problem is controlled. Associated symptoms include malaise/fatigue. Pertinent negatives include no peripheral edema or shortness of breath. Risk factors for coronary artery disease include dyslipidemia, obesity and sedentary lifestyle. The current treatment provides moderate improvement. There is no history of heart failure.  Gastroesophageal Reflux She complains of belching and heartburn. This is a chronic problem. The current episode started more than 1 year ago. The problem occurs occasionally. Risk factors include obesity. She has tried a PPI for the symptoms. The treatment provided moderate relief.  Arthritis Presents for follow-up visit. She complains of pain and stiffness. The symptoms have been stable. Affected locations include the left knee, right knee, left ankle, right ankle, left MCP and right MCP. Her pain is at a severity of 6/10.  Hyperlipidemia This is a chronic problem. The current episode started more than 1 year ago. The problem is controlled. Recent lipid tests were reviewed and are normal. Exacerbating diseases include obesity. Pertinent negatives include no shortness of breath. Current antihyperlipidemic treatment includes statins. The current treatment provides moderate improvement of lipids. Risk factors for coronary artery disease include hypertension, dyslipidemia, a sedentary lifestyle and post-menopausal.  Anemia Presents for follow-up visit. Symptoms include  malaise/fatigue. There is no history of heart failure.  Constipation This is a chronic problem. The current episode started more than 1 year ago. The problem has been resolved since onset. Her stool frequency is 1 time per day. Risk factors include obesity. She has tried laxatives for the symptoms. The treatment provided moderate relief.    Current opioids rx- Ultram 50 mg  # meds rx- 120  Effectiveness of current meds-stable Adverse reactions from pain meds-None Morphine equivalent- 8   Pill count performed-No Last drug screen - 06/16/21 ( high risk q60m, moderate risk q63m, low risk yearly ) Urine drug screen today- No Was the N[facility] reviewed- Yes             If yes were their any concerning findings? - none   Pain contract signed on: 06/20/21  Review of Systems  Constitutional:  Positive for malaise/fatigue.  Respiratory:  Negative for shortness of breath.   Gastrointestinal:  Positive for constipation and heartburn.  Musculoskeletal:  Positive for arthritis and stiffness.  All other systems reviewed and are negative.      Objective:   Physical Exam Vitals reviewed.  Constitutional:      General: She is not in acute distress.    Appearance: She is well-developed. She is obese.  HENT:     Head: Normocephalic and atraumatic.     Right Ear: Tympanic membrane normal.     Left Ear: Tympanic membrane normal.  Eyes:     Pupils: Pupils are equal, round, and reactive to light.  Neck:     Thyroid: No thyromegaly.  Cardiovascular:     Rate and Rhythm: Normal rate and regular rhythm.     Heart sounds: Normal heart sounds. No murmur heard. Pulmonary:  Effort: Pulmonary effort is normal. No respiratory distress.     Breath sounds: Normal breath sounds. No wheezing.  Abdominal:     General: Bowel sounds are normal. There is no distension.     Palpations: Abdomen is soft.     Tenderness: There is no abdominal tenderness.  Musculoskeletal:        General: No tenderness. Normal  range of motion.     Cervical back: Normal range of motion and neck supple.  Skin:    General: Skin is warm and dry.  Neurological:     Mental Status: She is alert and oriented to person, place, and time.     Cranial Nerves: No cranial nerve deficit.     Deep Tendon Reflexes: Reflexes are normal and symmetric.  Psychiatric:        Behavior: Behavior normal.        Thought Content: Thought content normal.        Judgment: Judgment normal.       BP 135/61   Pulse 74   Temp 97.9 F (36.6 C)   Ht 5\' 2"  (1.575 m)   Wt 169 lb (76.7 kg)   SpO2 100%   BMI 30.91 kg/m      Assessment & Plan:  [name]   Diagnosis and orders addressed:  1. Primary hypertension - amLODipine (NORVASC) 10 MG tablet; Take 1 tablet (10 mg total) by mouth daily.  Dispense: 90 tablet; Refill: 0  2. Gastroesophageal reflux disease without esophagitis  3. Primary osteoarthritis of both knees - diclofenac Sodium (VOLTAREN) 1 % GEL; APPLY TWO GRAMS TOPICALLY FOUR TIMES DAILY  Dispense: 200 g; Refill: 2 - traMADol (ULTRAM) 50 MG tablet; Take 1-2 tablets (50-100 mg total) by mouth every 6 (six) hours as needed. for pain  Dispense: 120 tablet; Refill: 2  4. Pure hypercholesterolemia  5. Deficiency anemia  6. Constipation, unspecified constipation type  7. Controlled substance agreement signed - traMADol (ULTRAM) 50 MG tablet; Take 1-2 tablets (50-100 mg total) by mouth every 6 (six) hours as needed. for pain  Dispense: 120 tablet; Refill: 2  8. Vitamin D deficiency  9. Chronic midline low back pain without sciatica - traMADol (ULTRAM) 50 MG tablet; Take 1-2 tablets (50-100 mg total) by mouth every 6 (six) hours as needed. for pain  Dispense: 120 tablet; Refill: 2   Labs pending Patient reviewed in [place] controlled database, no flags noted. Contract and drug screen are up to date.  Health Maintenance reviewed Diet and  exercise encouraged  Follow up plan: 3 months    Kristi Cella, FNP

## 2022-03-13 NOTE — Patient Instructions (Signed)
Osteoarthritis  Osteoarthritis is a type of arthritis. It refers to joint pain or joint disease. Osteoarthritis affects tissue that covers the ends of bones in joints (cartilage). Cartilage acts as a cushion between the bones and helps them move smoothly. Osteoarthritis occurs when cartilage in the joints gets worn down. Osteoarthritis is sometimes called "wear and tear" arthritis. Osteoarthritis is the most common form of arthritis. It often occurs in older people. It is a condition that gets worse over time. The joints most often affected by this condition are in the fingers, toes, hips, knees, and spine, including the neck and lower back. What are the causes? This condition is caused by the wearing down of cartilage that covers the ends of bones. What increases the risk? The following factors may make you more likely to develop this condition: Being age 50 or older. Obesity. Overuse of joints. Past injury of a joint. Past surgery on a joint. Family history of osteoarthritis. What are the signs or symptoms? The main symptoms of this condition are pain, swelling, and stiffness in the joint. Other symptoms may include: An enlarged joint. More pain and further damage caused by small pieces of bone or cartilage that break off and float inside of the joint. Small deposits of bone (osteophytes) that grow on the edges of the joint. A grating or scraping feeling inside the joint when you move it. Popping or creaking sounds when you move. Difficulty walking or exercising. An inability to grip items, twist your hand(s), or control the movements of your hands and fingers. How is this diagnosed? This condition may be diagnosed based on: Your medical history. A physical exam. Your symptoms. X-rays of the affected joint(s). Blood tests to rule out other types of arthritis. How is this treated? There is no cure for this condition, but treatment can help control pain and improve joint function.  Treatment may include a combination of therapies, such as: Pain relief techniques, such as: Applying heat and cold to the joint. Massage. A form of talk therapy called cognitive behavioral therapy (CBT). This therapy helps you set goals and follow up on the changes that you make. Medicines for pain and inflammation. The medicines can be taken by mouth or applied to the skin. They include: NSAIDs, such as ibuprofen. Prescription medicines. Strong anti-inflammatory medicines (corticosteroids). Certain nutritional supplements. A prescribed exercise program. You may work with a physical therapist. Assistive devices, such as a brace, wrap, splint, specialized glove, or cane. A weight control plan. Surgery, such as: An osteotomy. This is done to reposition the bones and relieve pain or to remove loose pieces of bone and cartilage. Joint replacement surgery. You may need this surgery if you have advanced osteoarthritis. Follow these instructions at home: Activity Rest your affected joints as told by your health care provider. Exercise as told by your health care provider. He or she may recommend specific types of exercise, such as: Strengthening exercises. These are done to strengthen the muscles that support joints affected by arthritis. Aerobic activities. These are exercises, such as brisk walking or water aerobics, that increase your heart rate. Range-of-motion activities. These help your joints move more easily. Balance and agility exercises. Managing pain, stiffness, and swelling     If directed, apply heat to the affected area as often as told by your health care provider. Use the heat source that your health care provider recommends, such as a moist heat pack or a heating pad. If you have a removable assistive device, remove it   as told by your health care provider. Place a towel between your skin and the heat source. If your health care provider tells you to keep the assistive device  on while you apply heat, place a towel between the assistive device and the heat source. Leave the heat on for 20-30 minutes. Remove the heat if your skin turns bright red. This is especially important if you are unable to feel pain, heat, or cold. You may have a greater risk of getting burned. If directed, put ice on the affected area. To do this: If you have a removable assistive device, remove it as told by your health care provider. Put ice in a plastic bag. Place a towel between your skin and the bag. If your health care provider tells you to keep the assistive device on during icing, place a towel between the assistive device and the bag. Leave the ice on for 20 minutes, 2-3 times a day. Move your fingers or toes often to reduce stiffness and swelling. Raise (elevate) the injured area above the level of your heart while you are sitting or lying down. General instructions Take over-the-counter and prescription medicines only as told by your health care provider. Maintain a healthy weight. Follow instructions from your health care provider for weight control. Do not use any products that contain nicotine or tobacco, such as cigarettes, e-cigarettes, and chewing tobacco. If you need help quitting, ask your health care provider. Use assistive devices as told by your health care provider. Keep all follow-up visits as told by your health care provider. This is important. Where to find more information National Institute of Arthritis and Musculoskeletal and Skin Diseases: www.niams.nih.gov National Institute on Aging: www.nia.nih.gov American College of Rheumatology: www.rheumatology.org Contact a health care provider if: You have redness, swelling, or a feeling of warmth in a joint that gets worse. You have a fever along with joint or muscle aches. You develop a rash. You have trouble doing your normal activities. Get help right away if: You have pain that gets worse and is not relieved by  pain medicine. Summary Osteoarthritis is a type of arthritis that affects tissue covering the ends of bones in joints (cartilage). This condition is caused by the wearing down of cartilage that covers the ends of bones. The main symptom of this condition is pain, swelling, and stiffness in the joint. There is no cure for this condition, but treatment can help control pain and improve joint function. This information is not intended to replace advice given to you by your health care provider. Make sure you discuss any questions you have with your health care provider. Document Revised: 08/11/2019 Document Reviewed: 08/11/2019 Elsevier Patient Education  2023 [employer] Inc.  

## 2022-03-13 NOTE — Progress Notes (Unsigned)
[place], [place] [address]   CLINIC:  Medical Oncology/Hematology  PCP:  Sharion Balloon, [place] [address] [phone]   REASON FOR VISIT:  Follow-up for normocytic anemia secondary to CKD +/- iron deficiency  CURRENT THERAPY: Oral iron supplementation  INTERVAL HISTORY:  Kristi Barnes 71 y.o. female returns for routine follow-up of her normocytic anemia.  She was last seen by Tarri Abernethy PA-C on 09/06/2021.  At today's visit, she reports feeling fairly well.  ***  No recent hospitalizations, surgeries, or changes in baseline health status.  She is taking her daily iron pill and weekly vitamin D. She is not taking any B12 supplementation at this time.  *** *** She complains of occasional leg cramps and headaches. *** Otherwise, she is asymptomatic.  She denies any fatigue, pica, chest pain, dyspnea on exertion, lightheadedness, or syncope. *** She has not noticed any bright red blood per rectum, melena, epistaxis, or hematemesis.  She has  *** % energy and  *** % appetite. She endorses that she is maintaining a stable weight.   REVIEW OF SYSTEMS:  ***  Review of Systems  Constitutional:  Negative for appetite change, chills, diaphoresis, fatigue, fever and unexpected weight change.  HENT:   Negative for lump/mass and nosebleeds.   Eyes:  Negative for eye problems.  Respiratory:  Negative for cough, hemoptysis and shortness of breath.   Cardiovascular:  Positive for leg swelling. Negative for chest pain and palpitations.  Gastrointestinal:  Negative for abdominal pain, blood in stool, constipation, diarrhea, nausea and vomiting.  Genitourinary:  Negative for hematuria.   Musculoskeletal:  Positive for arthralgias and myalgias.  Skin:  Positive for itching.  Neurological:  Positive for headaches and numbness. Negative for dizziness and light-headedness.  Hematological:  Does not bruise/bleed easily.       PAST MEDICAL/SURGICAL HISTORY:  Past Medical History:  Diagnosis Date   Arthritis    GERD (gastroesophageal reflux disease)    Glaucoma    Hyperlipidemia    Vitamin D deficiency    Past Surgical History:  Procedure Laterality Date   bunions Bilateral    COLONOSCOPY  01/2013   Dr. Sherrlyn Hock: Fentanyl 150 mcg/Versed 6 mg, sessile polyp removed from the cecum, tortuous rectosigmoid colon.  Pathology revealed focal hyperplastic change, lymphoid aggregate of second fragment with colonic tissue.  Advised to have another colonoscopy in 5 to 10 years.   COLONOSCOPY WITH PROPOFOL N/A 08/26/2019   Dr. Oneida Alar: INCOMPLETE DUE TO [place]. stool throughout the colon. hemorrhoids noted.   COLONOSCOPY WITH PROPOFOL N/A 02/19/2020   Procedure: COLONOSCOPY WITH PROPOFOL;  Surgeon: Daneil Dolin, MD;  Location: [facility];  Service: Endoscopy;  Laterality: N/A;  2:30pm     SOCIAL HISTORY:  Social History   Socioeconomic History   Marital status: Divorced    Spouse name: Not on file   Number of children: 2   Years of education: 12   Highest education level: High school graduate  Occupational History   Occupation: retired  Tobacco Use   Smoking status: Never   Smokeless tobacco: Never  Scientific laboratory technician Use: Never used  Substance and Sexual Activity   Alcohol use: No   Drug use: No   Sexual activity: Not Currently  Other Topics Concern   Not on file  Social History Narrative   Lives alone. Twin sister lives next door. Lots of family nearby  Social Determinants of Health   Financial Resource Strain: Low Risk  (01/19/2022)   Overall Financial Resource Strain (CARDIA)    Difficulty of Paying Living Expenses: Not hard at all  Food Insecurity: No Food Insecurity (01/19/2022)   Hunger Vital Sign    Worried About Running Out of Food in the Last Year: Never true    Ran Out of Food in the Last Year: Never true  Transportation Needs: No Transportation Needs  (01/19/2022)   PRAPARE - Hydrologist (Medical): No    Lack of Transportation (Non-Medical): No  Physical Activity: Sufficiently Active (01/19/2022)   Exercise Vital Sign    Days of Exercise per Week: 7 days    Minutes of Exercise per Session: 30 min  Stress: Stress Concern Present (01/19/2022)   [place]    Feeling of Stress : To some extent  Social Connections: Moderately Integrated (01/19/2022)   Social Connection and Isolation Panel [NHANES]    Frequency of Communication with Friends and Family: More than three times a week    Frequency of Social Gatherings with Friends and Family: More than three times a week    Attends Religious Services: More than 4 times per year    Active Member of [employer] or Organizations: Yes    Attends Music therapist: More than 4 times per year    Marital Status: Divorced  Intimate Partner Violence: Not At Risk (01/19/2022)   Humiliation, Afraid, Rape, and Kick questionnaire    Fear of Current or Ex-Partner: No    Emotionally Abused: No    Physically Abused: No    Sexually Abused: No    FAMILY HISTORY:  Family History  Problem Relation Age of Onset   Heart attack Mother    Cancer Father        lung   Leukemia Sister    Cancer Brother        back of neck   Cancer Sister        back of neck   Colon cancer Neg Hx     CURRENT MEDICATIONS:  Outpatient Encounter Medications as of 03/14/2022  Medication Sig   amLODipine (NORVASC) 10 MG tablet Take 1 tablet (10 mg total) by mouth daily.   Calcium Carbonate-Vit D-Min (CALCIUM 1200 PO) Take by mouth.   diclofenac Sodium (VOLTAREN) 1 % GEL APPLY TWO GRAMS TOPICALLY FOUR TIMES DAILY   Ferrous Sulfate (IRON) 325 (65 Fe) MG TABS Take by mouth.   latanoprost (XALATAN) 0.005 % ophthalmic solution 1 drop at bedtime.   omeprazole (PRILOSEC) 20 MG capsule TAKE ONE CAPSULE BY MOUTH DAILY   simvastatin  (ZOCOR) 40 MG tablet TAKE ONE TABLET BY MOUTH ONCE DAILY   traMADol (ULTRAM) 50 MG tablet Take 1-2 tablets (50-100 mg total) by mouth every 6 (six) hours as needed. for pain   Vitamin D, Ergocalciferol, (DRISDOL) 1.25 MG (50000 UNIT) CAPS capsule TAKE ONE CAPSULE BY MOUTH EVERY 7 DAYS.   vitamin E 180 MG (400 UNITS) capsule Take 400 Units by mouth daily.    No facility-administered encounter medications on file as of 03/14/2022.    ALLERGIES:  Allergies  Allergen Reactions   Asa [Aspirin] Other (See Comments)    Upset stomach       PHYSICAL EXAM:  ***  ECOG PERFORMANCE STATUS: 1 - Symptomatic but completely ambulatory  There were no vitals filed for this visit. There were no vitals filed for this  visit. Physical Exam Constitutional:      Appearance: Normal appearance. She is obese.  HENT:     Head: Normocephalic and atraumatic.     Mouth/Throat:     Mouth: Mucous membranes are moist.  Eyes:     Extraocular Movements: Extraocular movements intact.     Pupils: Pupils are equal, round, and reactive to light.  Cardiovascular:     Rate and Rhythm: Normal rate and regular rhythm.     Pulses: Normal pulses.     Heart sounds: Normal heart sounds.  Pulmonary:     Effort: Pulmonary effort is normal.     Breath sounds: Normal breath sounds.  Abdominal:     General: Bowel sounds are normal.     Palpations: Abdomen is soft.     Tenderness: There is no abdominal tenderness.  Musculoskeletal:        General: No swelling.     Right lower leg: No edema.     Left lower leg: No edema.  Lymphadenopathy:     Cervical: No cervical adenopathy.  Skin:    General: Skin is warm and dry.  Neurological:     General: No focal deficit present.     Mental Status: She is alert and oriented to person, place, and time.  Psychiatric:        Mood and Affect: Mood normal.        Behavior: Behavior normal.      LABORATORY DATA:  I have reviewed the labs as listed.  CBC    Component Value  Date/Time   WBC 4.8 03/07/2022 1247   RBC 3.76 (L) 03/07/2022 1247   HGB 11.2 (L) 03/07/2022 1247   HGB 11.2 05/07/2020 1111   HCT 34.4 (L) 03/07/2022 1247   HCT 33.5 (L) 05/07/2020 1111   PLT 256 03/07/2022 1247   PLT 220 05/07/2020 1111   MCV 91.5 03/07/2022 1247   MCV 89 05/07/2020 1111   MCH 29.8 03/07/2022 1247   MCHC 32.6 03/07/2022 1247   RDW 12.2 03/07/2022 1247   RDW 12.6 05/07/2020 1111   LYMPHSABS 2.2 03/07/2022 1247   LYMPHSABS 1.3 05/07/2020 1111   MONOABS 0.4 03/07/2022 1247   EOSABS 0.0 03/07/2022 1247   EOSABS 0.0 05/07/2020 1111   BASOSABS 0.0 03/07/2022 1247   BASOSABS 0.0 05/07/2020 1111      Latest Ref Rng & Units 08/30/2021   12:58 PM 09/22/2020   11:06 AM 07/29/2020   12:53 PM  CMP  Glucose 70 - 99 mg/dL 115  84  86   BUN 8 - 23 mg/dL '13  16  11   ' Creatinine 0.44 - 1.00 mg/dL 0.98  1.04  0.86   Sodium 135 - 145 mmol/L 140  139  138   Potassium 3.5 - 5.1 mmol/L 4.1  4.5  4.2   Chloride 98 - 111 mmol/L 107  104  103   CO2 22 - 32 mmol/L '25  29  26   ' Calcium 8.9 - 10.3 mg/dL 9.2  9.5  9.3   Total Protein 6.5 - 8.1 g/dL 7.6  7.6  7.9   Total Bilirubin 0.3 - 1.2 mg/dL 0.4  0.3  0.4   Alkaline Phos 38 - 126 U/L 61  56  54   AST 15 - 41 U/L '22  20  18   ' ALT 0 - 44 U/L '19  15  15     ' DIAGNOSTIC IMAGING:  I have independently reviewed the relevant imaging and discussed with  the patient.  ASSESSMENT & PLAN: 1.  Normocytic anemia +/- iron deficiency - Chronic problem since birth, per patient report - Hgb intermittently low ranging from 11.0 to normal with normal MCV - Colonoscopy (01/30/2020) showed no worrisome findings - SPEP negative.  CRP and ESR negative as well. - She is taking ferrous sulfate 134 mg (27 milligrams elemental iron) daily.  *** SWITCHED TO 325 mg DAILY?? - She has never required IV iron. - No signs of bleeding such as bright red blood per rectum or melena ***  - She has some leg cramps and occasional headaches.  Otherwise asymptomatic.  ***  - Most recent labs (03/07/2022): Hgb at baseline 11.2/MCV 91.5.  Ferritin 199, iron saturation 25%.  Normal B12, methylmalonic acid, folate, homocystine, vitamin D. - PLAN: *** Continue ferrous sulfate 325 mg (65 mg elemental iron), available over-the-counter. - We will repeat labs in 6 months followed by phone visit.  2.  Vitamin B12 deficiency, RESOLVED - Most recent vitamin B12 (03/07/2022) normal at 558 with normal methylmalonic acid - She is not taking any vitamin B12  - PLAN: No indication for vitamin B12 supplementation at this time.  3.  Vitamin D deficiency -The patient was previously taking vitamin D 10,000 units daily - Labs from 09/22/2020 showed elevated vitamin D at 106.75 - She is currently taking vitamin D 50,000 units weekly - Most recent vitamin D (03/07/2022) WNL at 50.48 - PLAN: Continue vitamin D 50,000 units weekly.  We will check levels at next visit in 6 months.  4.  Other history - Non-smoker - No personal history of cancer - Family history of several passing away from lung cancer.  Patient's sister had leukemia at age 3   PLAN SUMMARY & DISPOSITION: Labs in 6 months RTC after labs for phone visit ***  All questions were answered. The patient knows to call the clinic with any problems, questions or concerns.  Medical decision making: Low ***   Time spent on visit: I spent 15 minutes counseling the patient face to face. The total time spent in the appointment was 20 minutes and more than 50% was on counseling.   Harriett Rush, PA-C   ***

## 2022-03-14 ENCOUNTER — Inpatient Hospital Stay (HOSPITAL_BASED_OUTPATIENT_CLINIC_OR_DEPARTMENT_OTHER): Payer: Medicare Other | Admitting: Physician Assistant

## 2022-03-14 VITALS — BP 136/72 | HR 77 | Temp 98.2°F | Resp 18 | Ht 62.0 in | Wt 168.2 lb

## 2022-03-14 DIAGNOSIS — D513 Other dietary vitamin B12 deficiency anemia: Secondary | ICD-10-CM | POA: Diagnosis not present

## 2022-03-14 DIAGNOSIS — D649 Anemia, unspecified: Secondary | ICD-10-CM

## 2022-03-14 DIAGNOSIS — D509 Iron deficiency anemia, unspecified: Secondary | ICD-10-CM

## 2022-03-14 DIAGNOSIS — E559 Vitamin D deficiency, unspecified: Secondary | ICD-10-CM | POA: Diagnosis not present

## 2022-03-14 DIAGNOSIS — N189 Chronic kidney disease, unspecified: Secondary | ICD-10-CM | POA: Diagnosis not present

## 2022-03-14 NOTE — Patient Instructions (Signed)
[facility] at [facility] Discharge Instructions  You were seen today by Rojelio Brenner PA-C for your low iron levels.   IRON:  Your levels are good.  Continue to take iron tablet once daily.  Make sure that your over-the-counter iron is "ferrous sulfate 325 mg" - this may also be written as "65 mg iron." You can ask the pharmacist for help picking the right dose.  VITAMIN D:  Take 1 capsule (50,000 units) once each week - every Sunday before church.  LABS: Return in 6 months for repeat labs   FOLLOW-UP APPOINTMENT: Phone visit in 6 months, after labs   Thank you for choosing [facility] at [facility] to provide your oncology and hematology care.  To afford each patient quality time with our provider, please arrive at least 15 minutes before your scheduled appointment time.   If you have a lab appointment with the Cancer Center please come in thru the Main Entrance and check in at the main information desk.  You need to re-schedule your appointment should you arrive 10 or more minutes late.  We strive to give you quality time with our providers, and arriving late affects you and other patients whose appointments are after yours.  Also, if you no show three or more times for appointments you may be dismissed from the clinic at the providers discretion.     Again, thank you for choosing [facility].  Our hope is that these requests will decrease the amount of time that you wait before being seen by our physicians.       _____________________________________________________________  Should you have questions after your visit to [facility], please contact our office at [phone] and follow the prompts.  Our office hours are 8:00 a.m. and 4:30 p.m. Monday - Friday.  Please note that voicemails left after 4:00 p.m. may not be returned until the following business day.  We are closed weekends and major holidays.  You do  have access to a nurse 24-7, just call the main number to the clinic [phone] and do not press any options, hold on the line and a nurse will answer the phone.    For prescription refill requests, have your pharmacy contact our office and allow 72 hours.    Due to Covid, you will need to wear a mask upon entering the hospital. If you do not have a mask, a mask will be given to you at the Main Entrance upon arrival. For doctor visits, patients may have 1 support person age 94 or older with them. For treatment visits, patients can not have anyone with them due to social distancing guidelines and our immunocompromised population.

## 2022-05-23 ENCOUNTER — Encounter: Payer: Self-pay | Admitting: *Deleted

## 2022-06-02 ENCOUNTER — Other Ambulatory Visit: Payer: Self-pay | Admitting: Family

## 2022-06-02 DIAGNOSIS — E7800 Pure hypercholesterolemia, unspecified: Secondary | ICD-10-CM

## 2022-06-12 ENCOUNTER — Encounter (HOSPITAL_COMMUNITY)
Admission: RE | Admit: 2022-06-12 | Discharge: 2022-06-12 | Disposition: A | Discharge status: Home / Self Care | Payer: Medicare Other | Source: Ambulatory Visit | Attending: Ophthalmology | Admitting: Ophthalmology

## 2022-06-12 ENCOUNTER — Encounter (HOSPITAL_COMMUNITY): Payer: Self-pay

## 2022-06-12 HISTORY — DX: Essential (primary) hypertension: I10

## 2022-06-13 ENCOUNTER — Ambulatory Visit: Payer: Medicare Other | Admitting: Family

## 2022-06-13 NOTE — H&P (Signed)
Surgical History & Physical  Patient Name: Kristi Barnes DOB: 01-Dec-1950  Surgery: Cataract extraction with intraocular lens implant phacoemulsification; Right Eye  Surgeon: Baruch Goldmann MD Surgery Date:  06-19-22 Pre-Op Date:  06-08-22  HPI: A 65 Yr. old female patient is referred by Dr Rosana Hoes for cataract eval. 1. The patient complains of difficulty when driving, which began 2 years ago. Both eyes are affected. The episode is gradual. The condition's severity increased since last visit. Symptoms occur when the patient is driving, inside, outside and reading. This is negatively affecting the patient's quality of life and the patient is unable to function adequately in life with the current level of vision. Pt is using Latanoprost QHS OU for more than a year. HPI was performed by Baruch Goldmann .  Medical History: Glaucoma Cataracts Ptosis OU , Presbyopia, Conj Melanosis Glaucoma Anemia, Osteoporosis High Blood Pressure LDL  Review of Systems Negative Allergic/Immunologic Negative Cardiovascular Negative Constitutional Negative Ear, Nose, Mouth & Throat Negative Endocrine Negative Eyes Negative Gastrointestinal Negative Genitourinary Negative Hemotologic/Lymphatic Negative Integumentary Negative Musculoskeletal Negative Neurological Negative Psychiatry Negative Respiratory  Social   Never smoked   Medication Systane Complete, Latanoprost,  Ferrous sulfate, Simvastatin, Tramadol hydrochloride, Omeprazole, Amlodipine, Simvastatin, Calcium, Vitamin D3, Diclofenac Sodium, Vitamin E,   Sx/Procedures Feet bunion sx,   Drug Allergies  Aspirin,  Anemia, Osteoporosis  History & Physical: Heent:  Cataract, right eye  NECK: supple without bruits LUNGS: lungs clear to auscultation CV: regular rate and rhythm Abdomen: soft and non-tender Impression & Plan: Assessment: 1.  COMBINED FORMS AGE RELATED CATARACT; Both Eyes (H25.813) 2.  DERMATOCHALASIS, no surgery; Right Upper  Lid, Left Upper Lid (H02.831, H02.834) 3.  BLEPHARITIS; Right Upper Lid, Right Lower Lid, Left Upper Lid, Left Lower Lid (H01.001, H01.002,H01.004,H01.005) 4.  Pinguecula; Both Eyes (H11.153) 5.  OAG BORDERLINE FINDINGS HIGH RISK; Both Eyes (H40.023) 6.  ASTIGMATISM, REGULAR; Both Eyes (H52.223)  Plan: 1.  Cataract accounts for the patient's decreased vision. This visual impairment is not correctable with a tolerable change in glasses or contact lenses. Cataract surgery with an implantation of a new lens should significantly improve the visual and functional status of the patient. Discussed all risks, benefits, alternatives, and potential complications. Discussed the procedures and recovery. Patient desires to have surgery. A-scan ordered and performed today for intra-ocular lens calculations. The surgery will be performed in order to improve vision for driving, reading, and for eye examinations. Recommend phacoemulsification with intra-ocular lens. Recommend Dextenza for post-operative pain and inflammation. Right Eye worse - first. Dilates poorly - shugarcaine by protocol. Malyugin Ring. Omid[facility]. [facility].  2.  Asymptomatic, recommend observation for now. Findings, prognosis and treatment options reviewed.  3.  Recommend regular lid cleaning.  4.  Observe; Artificial tears as needed for irritation.  5.  Based on cup-to-disc ratio. Positive family history. African-American OCT rNFL today shows: WNL OD, borderline OS. Continue Latanoprost 1 drop both eyes every night at bedtime. Detailed discussion about glaucoma today including importance of maintaining good follow up and following treatment plan, and the possibility of irreversible blindness as part of this disease process.  6.  Worse OS - recommend toric IOL OS.

## 2022-06-15 ENCOUNTER — Encounter: Payer: Self-pay | Admitting: Family

## 2022-06-15 ENCOUNTER — Ambulatory Visit (INDEPENDENT_AMBULATORY_CARE_PROVIDER_SITE_OTHER): Payer: Medicare Other | Admitting: Family

## 2022-06-15 VITALS — BP 135/82 | HR 71 | Temp 97.6°F | Ht 62.0 in | Wt 164.0 lb

## 2022-06-15 DIAGNOSIS — M170 Bilateral primary osteoarthritis of knee: Secondary | ICD-10-CM

## 2022-06-15 DIAGNOSIS — K5900 Constipation, unspecified: Secondary | ICD-10-CM

## 2022-06-15 DIAGNOSIS — M545 Low back pain, unspecified: Secondary | ICD-10-CM

## 2022-06-15 DIAGNOSIS — E7800 Pure hypercholesterolemia, unspecified: Secondary | ICD-10-CM

## 2022-06-15 DIAGNOSIS — I10 Essential (primary) hypertension: Secondary | ICD-10-CM | POA: Diagnosis not present

## 2022-06-15 DIAGNOSIS — Z79899 Other long term (current) drug therapy: Secondary | ICD-10-CM

## 2022-06-15 DIAGNOSIS — G8929 Other chronic pain: Secondary | ICD-10-CM

## 2022-06-15 DIAGNOSIS — E559 Vitamin D deficiency, unspecified: Secondary | ICD-10-CM

## 2022-06-15 DIAGNOSIS — Z23 Encounter for immunization: Secondary | ICD-10-CM

## 2022-06-15 DIAGNOSIS — K219 Gastro-esophageal reflux disease without esophagitis: Secondary | ICD-10-CM

## 2022-06-15 MED ORDER — AMLODIPINE BESYLATE 10 MG PO TABS: 10.0000 mg | ORAL_TABLET | Freq: Every day | ORAL | 0 refills | Status: DC

## 2022-06-15 MED ORDER — TRAMADOL HCL 50 MG PO TABS: 50.0000 mg | ORAL_TABLET | Freq: Four times a day (QID) | ORAL | 2 refills | Status: DC | PRN

## 2022-06-15 NOTE — Progress Notes (Signed)
 Subjective:    Patient ID: Kristi Barnes, female    DOB: 03/08/1951, 71 y.o.   MRN: [id number]  Chief Complaint  Patient presents with   Medical Management of Chronic Issues   PT presents to the office today for chronic follow up. She is followed by Hematologists every 6 months for iron deficiency anemia.   Hypertension This is a chronic problem. The current episode started more than 1 year ago. The problem has been resolved since onset. The problem is controlled. Associated symptoms include malaise/fatigue. Pertinent negatives include no peripheral edema or shortness of breath. Risk factors for coronary artery disease include dyslipidemia and sedentary lifestyle. The current treatment provides moderate improvement.  Gastroesophageal Reflux She complains of belching and heartburn. This is a chronic problem. The current episode started more than 1 year ago. The problem occurs occasionally. Risk factors include obesity. She has tried a PPI for the symptoms. The treatment provided moderate relief.  Arthritis Presents for follow-up visit. She complains of pain and stiffness. The symptoms have been stable. Affected locations include the left knee, right knee, left MCP and right MCP. Her pain is at a severity of 8/10.  Hyperlipidemia This is a chronic problem. The current episode started more than 1 year ago. Recent lipid tests were reviewed and are normal. Exacerbating diseases include obesity. Pertinent negatives include no shortness of breath. Current antihyperlipidemic treatment includes statins. The current treatment provides moderate improvement of lipids. Risk factors for coronary artery disease include dyslipidemia, hypertension, a sedentary lifestyle and post-menopausal.  Constipation This is a chronic problem. The current episode started more than 1 year ago. The problem has been resolved since onset. Her stool frequency is 1 time per day. Risk factors include obesity. She has tried diet  changes for the symptoms. The treatment provided moderate relief.    Current opioids rx- Ultram 50 mg  # meds rx- 120  Effectiveness of current meds-stable Adverse reactions from pain meds-None Morphine equivalent- 8   Pill count performed-No Last drug screen - 06/15/22 ( high risk q3m, moderate risk q6m, low risk yearly ) Urine drug screen today- No Was the [facility] reviewed- Yes             If yes were their any concerning findings? - none   Pain contract signed on: 06/15/22  Review of Systems  Constitutional:  Positive for malaise/fatigue.  Respiratory:  Negative for shortness of breath.   Gastrointestinal:  Positive for constipation and heartburn.  Musculoskeletal:  Positive for arthritis and stiffness.  All other systems reviewed and are negative.      Objective:   Physical Exam Vitals reviewed.  Constitutional:      General: She is not in acute distress.    Appearance: She is well-developed. She is obese.  HENT:     Head: Normocephalic and atraumatic.     Right Ear: Tympanic membrane normal.     Left Ear: Tympanic membrane normal.  Eyes:     Pupils: Pupils are equal, round, and reactive to light.  Neck:     Thyroid: No thyromegaly.  Cardiovascular:     Rate and Rhythm: Normal rate and regular rhythm.     Heart sounds: Normal heart sounds. No murmur heard. Pulmonary:     Effort: Pulmonary effort is normal. No respiratory distress.     Breath sounds: Normal breath sounds. No wheezing.  Abdominal:     General: Bowel sounds are normal. There is no distension.     Palpations: Abdomen   is soft.     Tenderness: There is no abdominal tenderness.  Musculoskeletal:        General: No tenderness. Normal range of motion.     Cervical back: Normal range of motion and neck supple.  Skin:    General: Skin is warm and dry.  Neurological:     Mental Status: She is alert and oriented to person, place, and time.     Cranial Nerves: No cranial nerve deficit.     Deep Tendon  Reflexes: Reflexes are normal and symmetric.  Psychiatric:        Behavior: Behavior normal.        Thought Content: Thought content normal.        Judgment: Judgment normal.       BP 135/82   Pulse 71   Temp 97.6 F (36.4 C) (Temporal)   Ht 5' 2" (1.575 m)   Wt 164 lb (74.4 kg)   SpO2 99%   BMI 30.00 kg/m      Assessment & Plan:  Kristi Barnes [name]   Diagnosis and orders addressed:  1. Primary osteoarthritis of both knees - traMADol (ULTRAM) 50 MG tablet; Take 1-2 tablets (50-100 mg total) by mouth every 6 (six) hours as needed. for pain  Dispense: 120 tablet; Refill: 2 - ToxASSURE Select 13 (MW), Urine - CMP14+EGFR  2. Controlled substance agreement signed - traMADol (ULTRAM) 50 MG tablet; Take 1-2 tablets (50-100 mg total) by mouth every 6 (six) hours as needed. for pain  Dispense: 120 tablet; Refill: 2 - ToxASSURE Select 13 (MW), Urine - CMP14+EGFR  3. Chronic midline low back pain without sciatica  - traMADol (ULTRAM) 50 MG tablet; Take 1-2 tablets (50-100 mg total) by mouth every 6 (six) hours as needed. for pain  Dispense: 120 tablet; Refill: 2 - CMP14+EGFR  4. Primary hypertension - amLODipine (NORVASC) 10 MG tablet; Take 1 tablet (10 mg total) by mouth daily.  Dispense: 90 tablet; Refill: 0 - CMP14+EGFR  5. Need for immunization against influenza - Flu Vaccine QUAD High Dose(Fluad) - CMP14+EGFR  6. Gastroesophageal reflux disease without esophagitis - CMP14+EGFR  7. Pure hypercholesterolemia - CMP14+EGFR - Lipid panel  8. Vitamin D deficiency - CMP14+EGFR  9. Constipation, unspecified constipation type - CMP14+EGFR   Labs pending Patient reviewed in [place] controlled database, no flags noted. Contract and drug screen are up to date.  Health Maintenance reviewed Diet and exercise encouraged  Follow up plan: 3 months    Evelina Dun, FNP

## 2022-06-15 NOTE — Patient Instructions (Signed)
Osteoarthritis  Osteoarthritis is a type of arthritis. It refers to joint pain or joint disease. Osteoarthritis affects tissue that covers the ends of bones in joints (cartilage). Cartilage acts as a cushion between the bones and helps them move smoothly. Osteoarthritis occurs when cartilage in the joints gets worn down. Osteoarthritis is sometimes called "wear and tear" arthritis. Osteoarthritis is the most common form of arthritis. It often occurs in older people. It is a condition that gets worse over time. The joints most often affected by this condition are in the fingers, toes, hips, knees, and spine, including the neck and lower back. What are the causes? This condition is caused by the wearing down of cartilage that covers the ends of bones. What increases the risk? The following factors may make you more likely to develop this condition: Being age 50 or older. Obesity. Overuse of joints. Past injury of a joint. Past surgery on a joint. Family history of osteoarthritis. What are the signs or symptoms? The main symptoms of this condition are pain, swelling, and stiffness in the joint. Other symptoms may include: An enlarged joint. More pain and further damage caused by small pieces of bone or cartilage that break off and float inside of the joint. Small deposits of bone (osteophytes) that grow on the edges of the joint. A grating or scraping feeling inside the joint when you move it. Popping or creaking sounds when you move. Difficulty walking or exercising. An inability to grip items, twist your hand(s), or control the movements of your hands and fingers. How is this diagnosed? This condition may be diagnosed based on: Your medical history. A physical exam. Your symptoms. X-rays of the affected joint(s). Blood tests to rule out other types of arthritis. How is this treated? There is no cure for this condition, but treatment can help control pain and improve joint function.  Treatment may include a combination of therapies, such as: Pain relief techniques, such as: Applying heat and cold to the joint. Massage. A form of talk therapy called cognitive behavioral therapy (CBT). This therapy helps you set goals and follow up on the changes that you make. Medicines for pain and inflammation. The medicines can be taken by mouth or applied to the skin. They include: NSAIDs, such as ibuprofen. Prescription medicines. Strong anti-inflammatory medicines (corticosteroids). Certain nutritional supplements. A prescribed exercise program. You may work with a physical therapist. Assistive devices, such as a brace, wrap, splint, specialized glove, or cane. A weight control plan. Surgery, such as: An osteotomy. This is done to reposition the bones and relieve pain or to remove loose pieces of bone and cartilage. Joint replacement surgery. You may need this surgery if you have advanced osteoarthritis. Follow these instructions at home: Activity Rest your affected joints as told by your health care provider. Exercise as told by your health care provider. He or she may recommend specific types of exercise, such as: Strengthening exercises. These are done to strengthen the muscles that support joints affected by arthritis. Aerobic activities. These are exercises, such as brisk walking or water aerobics, that increase your heart rate. Range-of-motion activities. These help your joints move more easily. Balance and agility exercises. Managing pain, stiffness, and swelling     If directed, apply heat to the affected area as often as told by your health care provider. Use the heat source that your health care provider recommends, such as a moist heat pack or a heating pad. If you have a removable assistive device, remove it   as told by your health care provider. Place a towel between your skin and the heat source. If your health care provider tells you to keep the assistive device  on while you apply heat, place a towel between the assistive device and the heat source. Leave the heat on for 20-30 minutes. Remove the heat if your skin turns bright red. This is especially important if you are unable to feel pain, heat, or cold. You may have a greater risk of getting burned. If directed, put ice on the affected area. To do this: If you have a removable assistive device, remove it as told by your health care provider. Put ice in a plastic bag. Place a towel between your skin and the bag. If your health care provider tells you to keep the assistive device on during icing, place a towel between the assistive device and the bag. Leave the ice on for 20 minutes, 2-3 times a day. Move your fingers or toes often to reduce stiffness and swelling. Raise (elevate) the injured area above the level of your heart while you are sitting or lying down. General instructions Take over-the-counter and prescription medicines only as told by your health care provider. Maintain a healthy weight. Follow instructions from your health care provider for weight control. Do not use any products that contain nicotine or tobacco, such as cigarettes, e-cigarettes, and chewing tobacco. If you need help quitting, ask your health care provider. Use assistive devices as told by your health care provider. Keep all follow-up visits as told by your health care provider. This is important. Where to find more information National Institute of Arthritis and Musculoskeletal and Skin Diseases: www.niams.nih.gov National Institute on Aging: www.nia.nih.gov American College of Rheumatology: www.rheumatology.org Contact a health care provider if: You have redness, swelling, or a feeling of warmth in a joint that gets worse. You have a fever along with joint or muscle aches. You develop a rash. You have trouble doing your normal activities. Get help right away if: You have pain that gets worse and is not relieved by  pain medicine. Summary Osteoarthritis is a type of arthritis that affects tissue covering the ends of bones in joints (cartilage). This condition is caused by the wearing down of cartilage that covers the ends of bones. The main symptom of this condition is pain, swelling, and stiffness in the joint. There is no cure for this condition, but treatment can help control pain and improve joint function. This information is not intended to replace advice given to you by your health care provider. Make sure you discuss any questions you have with your health care provider. Document Revised: 08/11/2019 Document Reviewed: 08/11/2019 Elsevier Patient Education  2023 [employer] Inc.  

## 2022-06-19 ENCOUNTER — Ambulatory Visit (HOSPITAL_COMMUNITY): Payer: Medicare Other | Admitting: Anesthesiology

## 2022-06-19 ENCOUNTER — Encounter (HOSPITAL_COMMUNITY)
Admission: RE | Disposition: A | Discharge status: Home / Self Care | Payer: Self-pay | Source: Ambulatory Visit | Attending: Ophthalmology

## 2022-06-19 ENCOUNTER — Ambulatory Visit (HOSPITAL_BASED_OUTPATIENT_CLINIC_OR_DEPARTMENT_OTHER): Payer: Medicare Other | Admitting: Anesthesiology

## 2022-06-19 ENCOUNTER — Ambulatory Visit (HOSPITAL_COMMUNITY)
Admission: RE | Admit: 2022-06-19 | Discharge: 2022-06-19 | Disposition: A | Discharge status: Home / Self Care | Payer: Medicare Other | Source: Ambulatory Visit | Attending: Ophthalmology | Admitting: Ophthalmology

## 2022-06-19 DIAGNOSIS — H01004 Unspecified blepharitis left upper eyelid: Secondary | ICD-10-CM | POA: Diagnosis not present

## 2022-06-19 DIAGNOSIS — H25811 Combined forms of age-related cataract, right eye: Secondary | ICD-10-CM | POA: Diagnosis present

## 2022-06-19 DIAGNOSIS — H01001 Unspecified blepharitis right upper eyelid: Secondary | ICD-10-CM | POA: Diagnosis not present

## 2022-06-19 DIAGNOSIS — H01002 Unspecified blepharitis right lower eyelid: Secondary | ICD-10-CM | POA: Insufficient documentation

## 2022-06-19 DIAGNOSIS — I10 Essential (primary) hypertension: Secondary | ICD-10-CM | POA: Diagnosis not present

## 2022-06-19 DIAGNOSIS — D649 Anemia, unspecified: Secondary | ICD-10-CM

## 2022-06-19 DIAGNOSIS — H02831 Dermatochalasis of right upper eyelid: Secondary | ICD-10-CM | POA: Insufficient documentation

## 2022-06-19 DIAGNOSIS — H01005 Unspecified blepharitis left lower eyelid: Secondary | ICD-10-CM | POA: Diagnosis not present

## 2022-06-19 DIAGNOSIS — H11153 Pinguecula, bilateral: Secondary | ICD-10-CM | POA: Diagnosis not present

## 2022-06-19 DIAGNOSIS — H52223 Regular astigmatism, bilateral: Secondary | ICD-10-CM | POA: Insufficient documentation

## 2022-06-19 DIAGNOSIS — H02834 Dermatochalasis of left upper eyelid: Secondary | ICD-10-CM | POA: Diagnosis not present

## 2022-06-19 DIAGNOSIS — H40023 Open angle with borderline findings, high risk, bilateral: Secondary | ICD-10-CM | POA: Diagnosis not present

## 2022-06-19 HISTORY — PX: CATARACT EXTRACTION W/PHACO: SHX586

## 2022-06-19 SURGERY — PHACOEMULSIFICATION, CATARACT, WITH IOL INSERTION
Anesthesia: Monitor Anesthesia Care | Site: Eye | Laterality: Right

## 2022-06-19 MED ORDER — SODIUM CHLORIDE 0.9% FLUSH
INTRAVENOUS | Status: DC | PRN
  Administered 2022-06-19: 5 mL via INTRAVENOUS

## 2022-06-19 MED ORDER — MOXIFLOXACIN HCL 0.5 % OP SOLN
OPHTHALMIC | Status: DC | PRN
  Administered 2022-06-19: 0.2 mL via OPHTHALMIC

## 2022-06-19 MED ORDER — MIDAZOLAM HCL 2 MG/2ML IJ SOLN
INTRAMUSCULAR | Status: DC | PRN
  Administered 2022-06-19: 2 mg via INTRAVENOUS

## 2022-06-19 MED ORDER — TETRACAINE HCL 0.5 % OP SOLN
1.0000 [drp] | OPHTHALMIC | Status: AC | PRN
  Administered 2022-06-19 (×3): 1 [drp] via OPHTHALMIC

## 2022-06-19 MED ORDER — PHENYLEPHRINE HCL 2.5 % OP SOLN
1.0000 [drp] | OPHTHALMIC | Status: AC | PRN
  Administered 2022-06-19 (×3): 1 [drp] via OPHTHALMIC

## 2022-06-19 MED ORDER — STERILE WATER FOR IRRIGATION IR SOLN
Status: DC | PRN
  Administered 2022-06-19: 250 mL

## 2022-06-19 MED ORDER — MOXIFLOXACIN HCL 5 MG/ML IO SOLN
INTRAOCULAR | Status: AC
  Filled 2022-06-19: qty 1

## 2022-06-19 MED ORDER — TRYPAN BLUE 0.06 % IO SOSY
PREFILLED_SYRINGE | INTRAOCULAR | Status: AC
  Filled 2022-06-19: qty 0.5

## 2022-06-19 MED ORDER — POVIDONE-IODINE 5 % OP SOLN
OPHTHALMIC | Status: DC | PRN
  Administered 2022-06-19: 1 via OPHTHALMIC

## 2022-06-19 MED ORDER — PHENYLEPHRINE-KETOROLAC 1-0.3 % IO SOLN
INTRAOCULAR | Status: DC | PRN
  Administered 2022-06-19: 500 mL via OPHTHALMIC

## 2022-06-19 MED ORDER — LIDOCAINE HCL 3.5 % OP GEL
1.0000 | Freq: Once | OPHTHALMIC | Status: AC
  Administered 2022-06-19: 1 via OPHTHALMIC

## 2022-06-19 MED ORDER — SODIUM HYALURONATE 23MG/ML IO SOSY
PREFILLED_SYRINGE | INTRAOCULAR | Status: DC | PRN
  Administered 2022-06-19: 0.6 mL via INTRAOCULAR

## 2022-06-19 MED ORDER — TROPICAMIDE 1 % OP SOLN
1.0000 [drp] | OPHTHALMIC | Status: AC | PRN
  Administered 2022-06-19 (×3): 1 [drp] via OPHTHALMIC

## 2022-06-19 MED ORDER — EPINEPHRINE PF 1 MG/ML IJ SOLN
INTRAMUSCULAR | Status: AC
  Filled 2022-06-19: qty 2

## 2022-06-19 MED ORDER — PHENYLEPHRINE-KETOROLAC 1-0.3 % IO SOLN
INTRAOCULAR | Status: AC
  Filled 2022-06-19: qty 4

## 2022-06-19 MED ORDER — LIDOCAINE HCL (PF) 1 % IJ SOLN
INTRAOCULAR | Status: DC | PRN
  Administered 2022-06-19: 1 mL via OPHTHALMIC

## 2022-06-19 MED ORDER — BSS IO SOLN
INTRAOCULAR | Status: DC | PRN
  Administered 2022-06-19: 15 mL via INTRAOCULAR

## 2022-06-19 MED ORDER — MIDAZOLAM HCL 2 MG/2ML IJ SOLN
INTRAMUSCULAR | Status: AC
  Filled 2022-06-19: qty 2

## 2022-06-19 MED ORDER — SODIUM HYALURONATE 10 MG/ML IO SOLUTION
PREFILLED_SYRINGE | INTRAOCULAR | Status: DC | PRN
  Administered 2022-06-19: 0.85 mL via INTRAOCULAR

## 2022-06-19 SURGICAL SUPPLY — 16 items
CATARACT SUITE SIGHTPATH (MISCELLANEOUS) ×1 IMPLANT
CLOTH BEACON ORANGE TIMEOUT ST (SAFETY) ×1 IMPLANT
DRAPE HALF SHEET 40X57 (DRAPES) IMPLANT
EYE SHIELD UNIVERSAL CLEAR (GAUZE/BANDAGES/DRESSINGS) IMPLANT
FEE CATARACT SUITE SIGHTPATH (MISCELLANEOUS) ×1 IMPLANT
GLOVE BIOGEL PI IND STRL 7.0 (GLOVE) ×2 IMPLANT
GLOVE SS BIOGEL STRL SZ 6.5 (GLOVE) IMPLANT
LENS IOL RAYNER 19.5 (Intraocular Lens) ×1 IMPLANT
LENS IOL RAYONE EMV 19.5 (Intraocular Lens) IMPLANT
NDL HYPO 18GX1.5 BLUNT FILL (NEEDLE) ×1 IMPLANT
NEEDLE HYPO 18GX1.5 BLUNT FILL (NEEDLE) ×1 IMPLANT
PAD ARMBOARD 7.5X6 YLW CONV (MISCELLANEOUS) ×1 IMPLANT
SYR TB 1ML LL NO SAFETY (SYRINGE) ×1 IMPLANT
TAPE SURG TRANSPORE 1 IN (GAUZE/BANDAGES/DRESSINGS) IMPLANT
TAPE SURGICAL TRANSPORE 1 IN (GAUZE/BANDAGES/DRESSINGS) ×1
WATER STERILE IRR 250ML POUR (IV SOLUTION) ×1 IMPLANT

## 2022-06-19 NOTE — Anesthesia Postprocedure Evaluation (Signed)
Anesthesia Post Note  Patient: Kristi Barnes  Procedure(s) Performed: CATARACT EXTRACTION PHACO AND INTRAOCULAR LENS PLACEMENT (IOC) (Right: Eye)  Patient location during evaluation: Phase II Anesthesia Type: MAC Level of consciousness: awake Pain management: pain level controlled Vital Signs Assessment: post-procedure vital signs reviewed and stable Respiratory status: spontaneous breathing and respiratory function stable Cardiovascular status: blood pressure returned to baseline and stable Postop Assessment: no headache and no apparent nausea or vomiting Anesthetic complications: no Comments: Late entry   No notable events documented.   Last Vitals:  Vitals:   06/19/22 0656 06/19/22 0817  BP: 129/71 104/62  Pulse: 60 68  Resp: 14 14  Temp: 36.9 C 36.5 C  SpO2: 99% 99%    Last Pain:  Vitals:   06/19/22 0817  TempSrc: Oral  PainSc: 0-No pain                 Louann Sjogren

## 2022-06-19 NOTE — Discharge Instructions (Addendum)
Please discharge patient when stable, will follow up today with Dr. Wrzosek at the [facility] office immediately following discharge.  Leave shield in place until visit.  All paperwork with discharge instructions will be given at the office.   [facility] Address:  [address]  , [place] [address]  

## 2022-06-19 NOTE — Transfer of Care (Signed)
Immediate Anesthesia Transfer of Care Note  Patient: Kristi Barnes  Procedure(s) Performed: CATARACT EXTRACTION PHACO AND INTRAOCULAR LENS PLACEMENT (IOC) (Right: Eye)  Patient Location: PACU  Anesthesia Type:MAC  Level of Consciousness: awake, alert , oriented and patient cooperative  Airway & Oxygen Therapy: Patient Spontanous Breathing  Post-op Assessment: Report given to RN, Post -op Vital signs reviewed and stable and Patient moving all extremities X 4  Post vital signs: Reviewed and stable  Last Vitals:  Vitals Value Taken Time  BP    Temp    Pulse    Resp    SpO2      Last Pain:  Vitals:   06/19/22 0656  TempSrc: Oral  PainSc: 0-No pain      Patients Stated Pain Goal: 7 (09/62/83 6629)  Complications: No notable events documented.

## 2022-06-19 NOTE — Anesthesia Preprocedure Evaluation (Signed)
Anesthesia Evaluation  Patient identified by MRN, date of birth, ID band Patient awake    Reviewed: Allergy & Precautions, H&P , NPO status , Patient's Chart, lab work & pertinent test results, reviewed documented beta blocker date and time   Airway Mallampati: II  TM Distance: >3 FB Neck ROM: full    Dental no notable dental hx.    Pulmonary neg pulmonary ROS,    Pulmonary exam normal breath sounds clear to auscultation       Cardiovascular Exercise Tolerance: Good hypertension, negative cardio ROS   Rhythm:regular Rate:Normal     Neuro/Psych negative neurological ROS  negative psych ROS   GI/Hepatic negative GI ROS, Neg liver ROS, GERD  Medicated,  Endo/Other  negative endocrine ROS  Renal/GU negative Renal ROS  negative genitourinary   Musculoskeletal   Abdominal   Peds  Hematology negative hematology ROS (+) Blood dyscrasia, anemia ,   Anesthesia Other Findings   Reproductive/Obstetrics negative OB ROS                             Anesthesia Physical Anesthesia Plan  ASA: 2  Anesthesia Plan: MAC   Post-op Pain Management:    Induction:   PONV Risk Score and Plan:   Airway Management Planned:   Additional Equipment:   Intra-op Plan:   Post-operative Plan:   Informed Consent: I have reviewed the patients History and Physical, chart, labs and discussed the procedure including the risks, benefits and alternatives for the proposed anesthesia with the patient or authorized representative who has indicated his/her understanding and acceptance.     Dental Advisory Given  Plan Discussed with: CRNA  Anesthesia Plan Comments:         Anesthesia Quick Evaluation

## 2022-06-19 NOTE — Interval H&P Note (Signed)
History and Physical Interval Note:  06/19/2022 7:46 AM  Kristi Barnes  has presented today for surgery, with the diagnosis of combined forms age related cataract; right.  The various methods of treatment have been discussed with the patient and family. After consideration of risks, benefits and other options for treatment, the patient has consented to  Procedure(s) with comments: CATARACT EXTRACTION PHACO AND INTRAOCULAR LENS PLACEMENT (IOC) (Right) - right as a surgical intervention.  The patient's history has been reviewed, patient examined, no change in status, stable for surgery.  I have reviewed the patient's chart and labs.  Questions were answered to the patient's satisfaction.     Baruch Goldmann

## 2022-06-19 NOTE — Op Note (Signed)
Date of procedure: 06/19/22  Pre-operative diagnosis:  Visually significant combined form age-related cataract, Right Eye (H25.811)  Post-operative diagnosis:  Visually significant combined form age-related cataract, Right Eye (H25.811)  Procedure: Removal of cataract via phacoemulsification and insertion of intra-ocular lens Rayner [id number] +19.5D into the capsular bag of the Right Eye  Attending surgeon: Gerda Diss. Conan Mcmanaway, MD, MA  Anesthesia: MAC, Topical Akten  Complications: None  Estimated Blood Loss: <3m (minimal)  Specimens: None  Implants: As above  Indications:  Visually significant age-related cataract, Right Eye  Procedure:  The patient was seen and identified in the pre-operative area. The operative eye was identified and dilated.  The operative eye was marked.  Topical anesthesia was administered to the operative eye.     The patient was then to the operative suite and placed in the supine position.  A timeout was performed confirming the patient, procedure to be performed, and all other relevant information.   The patient's face was prepped and draped in the usual fashion for intra-ocular surgery.  A lid speculum was placed into the operative eye and the surgical microscope moved into place and focused.  A superotemporal paracentesis was created using a 20 gauge paracentesis blade.  Shugarcaine was injected into the anterior chamber.  Viscoelastic was injected into the anterior chamber.  A temporal clear-corneal main wound incision was created using a 2.464mmicrokeratome.  A continuous curvilinear capsulorrhexis was initiated using an irrigating cystitome and completed using capsulorrhexis forceps.  Hydrodissection and hydrodeliniation were performed.  Viscoelastic was injected into the anterior chamber.  A phacoemulsification handpiece and a chopper as a second instrument were used to remove the nucleus and epinucleus. The irrigation/aspiration handpiece was used to remove any  remaining cortical material.   The capsular bag was reinflated with viscoelastic, checked, and found to be intact.  The intraocular lens was inserted into the capsular bag.  The irrigation/aspiration handpiece was used to remove any remaining viscoelastic.  The clear corneal wound and paracentesis wounds were then hydrated and checked with Weck-Cels to be watertight. 0.42m49mf moxifloxacin was injected into the anterior chamber. The lid-speculum was removed.  The drape was removed.  The patient's face was cleaned with a wet and dry 4x4. A clear shield was taped over the eye. The patient was taken to the post-operative care unit in good condition, having tolerated the procedure well.  Post-Op Instructions: The [facility]operative [facility] and will receive all other orders and instructions.

## 2022-06-20 ENCOUNTER — Encounter (HOSPITAL_COMMUNITY): Payer: Self-pay | Admitting: Ophthalmology

## 2022-06-21 LAB — TOXASSURE SELECT 13 (MW), URINE

## 2022-06-30 ENCOUNTER — Telehealth: Payer: Self-pay | Admitting: Family

## 2022-06-30 NOTE — Telephone Encounter (Signed)
Is she taking Ultram for pain also? She can try the OTC Voltaren and see if that is cheaper.

## 2022-06-30 NOTE — Telephone Encounter (Signed)
Pt does have tramadol(ultram). She will purchase OTC Voltaren as well.

## 2022-06-30 NOTE — Telephone Encounter (Signed)
Pt called to let PCP know that her insurance isnt paying for her Voltaren Gel Rx anymore and pt needs the medicine to help with her pain.  Needs advise

## 2022-07-05 ENCOUNTER — Other Ambulatory Visit: Payer: Self-pay

## 2022-07-05 ENCOUNTER — Encounter (HOSPITAL_COMMUNITY)
Admission: RE | Admit: 2022-07-05 | Discharge: 2022-07-05 | Disposition: A | Discharge status: Home / Self Care | Payer: Medicare Other | Source: Ambulatory Visit | Attending: Ophthalmology | Admitting: Ophthalmology

## 2022-07-10 ENCOUNTER — Encounter (HOSPITAL_COMMUNITY)
Admission: RE | Disposition: A | Discharge status: Home / Self Care | Payer: Self-pay | Source: Ambulatory Visit | Attending: Ophthalmology

## 2022-07-10 ENCOUNTER — Encounter (HOSPITAL_COMMUNITY): Payer: Self-pay | Admitting: Ophthalmology

## 2022-07-10 ENCOUNTER — Ambulatory Visit (HOSPITAL_COMMUNITY): Payer: Medicare Other | Admitting: Certified Registered Nurse Anesthetist

## 2022-07-10 ENCOUNTER — Ambulatory Visit (HOSPITAL_COMMUNITY)
Admission: RE | Admit: 2022-07-10 | Discharge: 2022-07-10 | Disposition: A | Discharge status: Home / Self Care | Payer: Medicare Other | Source: Ambulatory Visit | Attending: Ophthalmology | Admitting: Ophthalmology

## 2022-07-10 ENCOUNTER — Ambulatory Visit (HOSPITAL_BASED_OUTPATIENT_CLINIC_OR_DEPARTMENT_OTHER): Payer: Medicare Other | Admitting: Certified Registered Nurse Anesthetist

## 2022-07-10 DIAGNOSIS — Z9841 Cataract extraction status, right eye: Secondary | ICD-10-CM | POA: Diagnosis not present

## 2022-07-10 DIAGNOSIS — I10 Essential (primary) hypertension: Secondary | ICD-10-CM

## 2022-07-10 DIAGNOSIS — H5201 Hypermetropia, right eye: Secondary | ICD-10-CM | POA: Insufficient documentation

## 2022-07-10 DIAGNOSIS — D649 Anemia, unspecified: Secondary | ICD-10-CM

## 2022-07-10 DIAGNOSIS — H25812 Combined forms of age-related cataract, left eye: Secondary | ICD-10-CM | POA: Diagnosis present

## 2022-07-10 DIAGNOSIS — K219 Gastro-esophageal reflux disease without esophagitis: Secondary | ICD-10-CM | POA: Insufficient documentation

## 2022-07-10 HISTORY — PX: CATARACT EXTRACTION W/PHACO: SHX586

## 2022-07-10 SURGERY — PHACOEMULSIFICATION, CATARACT, WITH IOL INSERTION
Anesthesia: Monitor Anesthesia Care | Site: Eye | Laterality: Left

## 2022-07-10 MED ORDER — TRYPAN BLUE 0.06 % IO SOSY
PREFILLED_SYRINGE | INTRAOCULAR | Status: DC | PRN
  Administered 2022-07-10: .5 mL via INTRAOCULAR

## 2022-07-10 MED ORDER — MIDAZOLAM HCL 5 MG/5ML IJ SOLN
INTRAMUSCULAR | Status: DC | PRN
  Administered 2022-07-10: 1 mg via INTRAVENOUS

## 2022-07-10 MED ORDER — STERILE WATER FOR IRRIGATION IR SOLN
Status: DC | PRN
  Administered 2022-07-10: 250 mL

## 2022-07-10 MED ORDER — MOXIFLOXACIN HCL 5 MG/ML IO SOLN
INTRAOCULAR | Status: AC
  Filled 2022-07-10: qty 1

## 2022-07-10 MED ORDER — PHENYLEPHRINE-KETOROLAC 1-0.3 % IO SOLN
INTRAOCULAR | Status: DC | PRN
  Administered 2022-07-10: 500 mL via OPHTHALMIC

## 2022-07-10 MED ORDER — SODIUM HYALURONATE 23MG/ML IO SOSY
PREFILLED_SYRINGE | INTRAOCULAR | Status: DC | PRN
  Administered 2022-07-10: .6 mL via INTRAOCULAR

## 2022-07-10 MED ORDER — PHENYLEPHRINE HCL 2.5 % OP SOLN
1.0000 [drp] | OPHTHALMIC | Status: AC | PRN
  Administered 2022-07-10 (×3): 1 [drp] via OPHTHALMIC

## 2022-07-10 MED ORDER — PHENYLEPHRINE-KETOROLAC 1-0.3 % IO SOLN
INTRAOCULAR | Status: AC
  Filled 2022-07-10: qty 4

## 2022-07-10 MED ORDER — BSS IO SOLN
INTRAOCULAR | Status: DC | PRN
  Administered 2022-07-10: 15 mL via INTRAOCULAR

## 2022-07-10 MED ORDER — SODIUM HYALURONATE 10 MG/ML IO SOLUTION
PREFILLED_SYRINGE | INTRAOCULAR | Status: DC | PRN
  Administered 2022-07-10: .85 mL via INTRAOCULAR

## 2022-07-10 MED ORDER — TROPICAMIDE 1 % OP SOLN
1.0000 [drp] | OPHTHALMIC | Status: AC | PRN
  Administered 2022-07-10 (×3): 1 [drp] via OPHTHALMIC

## 2022-07-10 MED ORDER — LIDOCAINE HCL 3.5 % OP GEL
1.0000 | Freq: Once | OPHTHALMIC | Status: AC
  Administered 2022-07-10: 1 via OPHTHALMIC

## 2022-07-10 MED ORDER — MOXIFLOXACIN HCL 0.5 % OP SOLN
OPHTHALMIC | Status: DC | PRN
  Administered 2022-07-10: .2 mL via OPHTHALMIC

## 2022-07-10 MED ORDER — EPINEPHRINE PF 1 MG/ML IJ SOLN
INTRAMUSCULAR | Status: AC
  Filled 2022-07-10: qty 2

## 2022-07-10 MED ORDER — TRYPAN BLUE 0.06 % IO SOSY
PREFILLED_SYRINGE | INTRAOCULAR | Status: AC
  Filled 2022-07-10: qty 0.5

## 2022-07-10 MED ORDER — POVIDONE-IODINE 5 % OP SOLN
OPHTHALMIC | Status: DC | PRN
  Administered 2022-07-10: 1 via OPHTHALMIC

## 2022-07-10 MED ORDER — TETRACAINE HCL 0.5 % OP SOLN
1.0000 [drp] | OPHTHALMIC | Status: AC | PRN
  Administered 2022-07-10 (×3): 1 [drp] via OPHTHALMIC

## 2022-07-10 MED ORDER — LIDOCAINE HCL (PF) 1 % IJ SOLN
INTRAOCULAR | Status: DC | PRN
  Administered 2022-07-10: 1 mL via OPHTHALMIC

## 2022-07-10 MED ORDER — FENTANYL CITRATE (PF) 100 MCG/2ML IJ SOLN
INTRAMUSCULAR | Status: DC | PRN
  Administered 2022-07-10: 50 ug via INTRAVENOUS

## 2022-07-10 MED ORDER — SODIUM CHLORIDE 0.9% FLUSH
INTRAVENOUS | Status: DC | PRN
  Administered 2022-07-10: 3 mL via INTRAVENOUS

## 2022-07-10 MED ORDER — MIDAZOLAM HCL 2 MG/2ML IJ SOLN
INTRAMUSCULAR | Status: AC
  Filled 2022-07-10: qty 2

## 2022-07-10 MED ORDER — FENTANYL CITRATE (PF) 100 MCG/2ML IJ SOLN
INTRAMUSCULAR | Status: AC
  Filled 2022-07-10: qty 2

## 2022-07-10 SURGICAL SUPPLY — 17 items
CATARACT SUITE SIGHTPATH (MISCELLANEOUS) ×1 IMPLANT
CLOTH BEACON ORANGE TIMEOUT ST (SAFETY) ×1 IMPLANT
DRAPE HALF SHEET 40X57 (DRAPES) IMPLANT
EYE SHIELD UNIVERSAL CLEAR (GAUZE/BANDAGES/DRESSINGS) IMPLANT
FEE CATARACT SUITE SIGHTPATH (MISCELLANEOUS) ×1 IMPLANT
GLOVE BIOGEL PI IND STRL 6.5 (GLOVE) IMPLANT
GLOVE BIOGEL PI IND STRL 7.0 (GLOVE) ×2 IMPLANT
LENS IOL RAYNER 19.0 (Intraocular Lens) ×1 IMPLANT
LENS IOL RAYONE EMV 19.0 (Intraocular Lens) IMPLANT
NDL HYPO 18GX1.5 BLUNT FILL (NEEDLE) ×1 IMPLANT
NEEDLE HYPO 18GX1.5 BLUNT FILL (NEEDLE) ×1 IMPLANT
PAD ARMBOARD 7.5X6 YLW CONV (MISCELLANEOUS) ×1 IMPLANT
SYR TB 1ML LL NO SAFETY (SYRINGE) ×1 IMPLANT
TAPE SURG TRANSPORE 1 IN (GAUZE/BANDAGES/DRESSINGS) IMPLANT
TAPE SURGICAL TRANSPORE 1 IN (GAUZE/BANDAGES/DRESSINGS) ×1
TIP IRRIGATON/ASPIRATION (MISCELLANEOUS) IMPLANT
WATER STERILE IRR 250ML POUR (IV SOLUTION) ×1 IMPLANT

## 2022-07-10 NOTE — Interval H&P Note (Signed)
History and Physical Interval Note:  07/10/2022 2:40 PM  Kristi Barnes  has presented today for surgery, with the diagnosis of combined forms age related cataract; left.  The various methods of treatment have been discussed with the patient and family. After consideration of risks, benefits and other options for treatment, the patient has consented to  Procedure(s) with comments: CATARACT EXTRACTION PHACO AND INTRAOCULAR LENS PLACEMENT (IOC) (Left) - CDE as a surgical intervention.  The patient's history has been reviewed, patient examined, no change in status, stable for surgery.  I have reviewed the patient's chart and labs.  Questions were answered to the patient's satisfaction.     Fabio Pierce

## 2022-07-10 NOTE — H&P (Signed)
Surgical History & Physical  Patient Name: Kristi Barnes DOB: December 16, 1950  Surgery: Cataract extraction with intraocular lens implant phacoemulsification; Left Eye  Surgeon: Fabio Pierce MD Surgery Date:  07-10-22 Pre-Op Date:  07-06-22  HPI: A 12 Yr. old female patient 1. The patient is returning after cataract surgery. The right eye is affected. Status post cataract surgery, which began 2 weeks ago: Since the last visit, the affected area is doing well. The patient's vision is improved. Patient is following medication instructions. 2. 2. The patient is returning for a cataract follow-up of the left eye. Since the last visit, the affected area is worsening. The patient's vision is blurry. The complaint is associated with difficulty reading small print on medicine bottles/labels, difficulty driving at night due to halos/glare, and difficulty seeing steps/stairs/curbs. This is negatively affecting the patient's quality of life and the patient is unable to function adequately in life with the current level of vision. HPI was performed by Fabio Pierce .  Medical History: Glaucoma Cataracts Ptosis OU , Presbyopia, Conj Melanosis Glaucoma Anemia, Osteoporosis High Blood Pressure LDL  Review of Systems Negative Allergic/Immunologic Negative Cardiovascular Negative Constitutional Negative Ear, Nose, Mouth & Throat Negative Endocrine Negative Eyes Negative Gastrointestinal Negative Genitourinary Negative Hemotologic/Lymphatic Negative Integumentary Negative Musculoskeletal Negative Neurological Negative Psychiatry Negative Respiratory  Social   Never smoked   Medication Systane Complete, Latanoprost, Prednisolone-Moxifloxacin-Bromfenac,  Ferrous sulfate, Simvastatin, Tramadol hydrochloride, Omeprazole, Amlodipine, Simvastatin, Calcium, Vitamin D3, Diclofenac Sodium, Vitamin E,   Sx/Procedures Phaco c IOL OD,  Feet bunion sx,   Drug Allergies  Aspirin,   History &  Physical: Heent: Cataract, Left eye NECK: supple without bruits LUNGS: lungs clear to auscultation CV: regular rate and rhythm Abdomen: soft and non-tender Impression & Plan: Assessment: 1.  CATARACT EXTRACTION STATUS; Right Eye (Z98.41) 2.  COMBINED FORMS AGE RELATED CATARACT; Left Eye (H25.812) 3.  Hyperopia ; Right Eye (H52.01)  Plan: 1.  2 weeks after cataract surgery. Doing well with improved vision and normal eye pressure. Call with any problems or concerns. Continue Pred-Moxi-Brom 2x/day for 2 more weeks.  2.  Cataract accounts for the patient's decreased vision. This visual impairment is not correctable with a tolerable change in glasses or contact lenses. Cataract surgery with an implantation of a new lens should significantly improve the visual and functional status of the patient. Discussed all risks, benefits, alternatives, and potential complications. Discussed the procedures and recovery. Patient desires to have surgery. A-scan ordered and performed today for intra-ocular lens calculations. The surgery will be performed in order to improve vision for driving, reading, and for eye examinations. Recommend phacoemulsification with intra-ocular lens. Recommend Dextenza for post-operative pain and inflammation. Left Eye. Surgery required to correct imbalance of vision. Dilates poorly - shugarcaine by protocol. Malyugin Ring. Omidira.  3.

## 2022-07-10 NOTE — Discharge Instructions (Addendum)
Please discharge patient when stable, will follow up today with Dr. Wrzosek at the [facility] office immediately following discharge.  Leave shield in place until visit.  All paperwork with discharge instructions will be given at the office.  [facility] Address:  [address]  [place], [place] [address]  

## 2022-07-10 NOTE — Anesthesia Postprocedure Evaluation (Signed)
Anesthesia Post Note  Patient: Kristi Barnes  Procedure(s) Performed: CATARACT EXTRACTION PHACO AND INTRAOCULAR LENS PLACEMENT (IOC) (Left: Eye)  Patient location during evaluation: Phase II Anesthesia Type: MAC Level of consciousness: awake Pain management: pain level controlled Vital Signs Assessment: post-procedure vital signs reviewed and stable Respiratory status: spontaneous breathing and respiratory function stable Cardiovascular status: blood pressure returned to baseline and stable Postop Assessment: no headache and no apparent nausea or vomiting Anesthetic complications: no Comments: Late entry   No notable events documented.   Last Vitals:  Vitals:   07/10/22 1343 07/10/22 1510  BP: 132/70 (!) 119/56  Pulse: 64 62  Resp: 12 16  Temp: 36.7 C 36.9 C  SpO2: 99% 99%    Last Pain:  Vitals:   07/10/22 1510  TempSrc: Oral  PainSc: 0-No pain                 Louann Sjogren

## 2022-07-10 NOTE — Anesthesia Preprocedure Evaluation (Signed)
Anesthesia Evaluation  Patient identified by MRN, date of birth, ID band Patient awake    Reviewed: Allergy & Precautions, H&P , NPO status , Patient's Chart, lab work & pertinent test results, reviewed documented beta blocker date and time   Airway Mallampati: II  TM Distance: >3 FB Neck ROM: full    Dental no notable dental hx.    Pulmonary neg pulmonary ROS   Pulmonary exam normal breath sounds clear to auscultation       Cardiovascular Exercise Tolerance: Good hypertension, negative cardio ROS  Rhythm:regular Rate:Normal     Neuro/Psych negative neurological ROS  negative psych ROS   GI/Hepatic negative GI ROS, Neg liver ROS,GERD  Medicated,,  Endo/Other  negative endocrine ROS    Renal/GU negative Renal ROS  negative genitourinary   Musculoskeletal   Abdominal   Peds  Hematology negative hematology ROS (+) Blood dyscrasia, anemia   Anesthesia Other Findings   Reproductive/Obstetrics negative OB ROS                              Anesthesia Physical Anesthesia Plan  ASA: 2  Anesthesia Plan: MAC   Post-op Pain Management:    Induction:   PONV Risk Score and Plan:   Airway Management Planned:   Additional Equipment:   Intra-op Plan:   Post-operative Plan:   Informed Consent: I have reviewed the patients History and Physical, chart, labs and discussed the procedure including the risks, benefits and alternatives for the proposed anesthesia with the patient or authorized representative who has indicated his/her understanding and acceptance.     Dental Advisory Given  Plan Discussed with: CRNA  Anesthesia Plan Comments:          Anesthesia Quick Evaluation

## 2022-07-10 NOTE — Transfer of Care (Signed)
Immediate Anesthesia Transfer of Care Note  Patient: Kristi Barnes  Procedure(s) Performed: CATARACT EXTRACTION PHACO AND INTRAOCULAR LENS PLACEMENT (IOC) (Left: Eye)  Patient Location: Short Stay  Anesthesia Type:MAC  Level of Consciousness: awake and alert   Airway & Oxygen Therapy: Patient Spontanous Breathing  Post-op Assessment: Report given to RN and Post -op Vital signs reviewed and stable  Post vital signs: Reviewed and stable  Last Vitals:  Vitals Value Taken Time  BP 119/56 07/10/22 1510  Temp 36.9 C 07/10/22 1510  Pulse 62 07/10/22 1510  Resp 16 07/10/22 1510  SpO2 99 % 07/10/22 1510    Last Pain:  Vitals:   07/10/22 1510  TempSrc: Oral  PainSc: 0-No pain      Patients Stated Pain Goal: 7 (72/07/21 8288)  Complications: No notable events documented.

## 2022-07-10 NOTE — Op Note (Signed)
Date of procedure: 07/10/22  Pre-operative diagnosis: Visually significant age-related combined cataract, Left Eye (H25.812)  Post-operative diagnosis: Visually significant age-related combined cataract, Left Eye (H25.812)  Procedure: Removal of cataract via phacoemulsification and insertion of intra-ocular lens Rayner [id number] +19.0D into the capsular bag of the Left Eye  Attending surgeon: Gerda Diss. Deeana Atwater, MD, MA  Anesthesia: MAC, Topical Akten  Complications: None  Estimated Blood Loss: <75m (minimal)  Specimens: None  Implants: As above  Indications:  Visually significant age-related cataract, Left Eye  Procedure:  The patient was seen and identified in the pre-operative area. The operative eye was identified and dilated.  The operative eye was marked.  Topical anesthesia was administered to the operative eye.     The patient was then to the operative suite and placed in the supine position.  A timeout was performed confirming the patient, procedure to be performed, and all other relevant information.   The patient's face was prepped and draped in the usual fashion for intra-ocular surgery.  A lid speculum was placed into the operative eye and the surgical microscope moved into place and focused.  An inferotemporal paracentesis was created using a 20 gauge paracentesis blade.Vision Blue was used to stain the anterior capsule. BSS was used to irrigate out the [employer] Shugarcaine was injected into the anterior chamber.  Viscoelastic was injected into the anterior chamber.  A temporal clear-corneal main wound incision was created using a 2.490mmicrokeratome.  A continuous curvilinear capsulorrhexis was initiated using an irrigating cystitome and completed using capsulorrhexis forceps.  Hydrodissection and hydrodeliniation were performed.  Viscoelastic was injected into the anterior chamber.  A phacoemulsification handpiece and a chopper as a second instrument were used to remove the  nucleus and epinucleus. The irrigation/aspiration handpiece was used to remove any remaining cortical material.   The capsular bag was reinflated with viscoelastic, checked, and found to be intact.  The intraocular lens was inserted into the capsular bag.  The irrigation/aspiration handpiece was used to remove any remaining viscoelastic.  The clear corneal wound and paracentesis wounds were then hydrated and checked with Weck-Cels to be watertight. 0.54m65mf moxifloxacin was injected into the anterior chamber. The lid-speculum was removed.  The drape was removed.  The patient's face was cleaned with a wet and dry 4x4.    A clear shield was taped over the eye. The patient was taken to the post-operative care unit in good condition, having tolerated the procedure well.  Post-Op Instructions: The [facility] post[facility]operative evaluation and will receive all other orders and instructions.

## 2022-07-14 ENCOUNTER — Encounter (HOSPITAL_COMMUNITY): Payer: Self-pay | Admitting: Ophthalmology

## 2022-09-01 ENCOUNTER — Other Ambulatory Visit: Payer: Self-pay | Admitting: Family

## 2022-09-01 DIAGNOSIS — E7800 Pure hypercholesterolemia, unspecified: Secondary | ICD-10-CM

## 2022-09-13 ENCOUNTER — Telehealth: Payer: Self-pay

## 2022-09-13 NOTE — Telephone Encounter (Signed)
Transition Care Management Unsuccessful Follow-up Telephone Call  Date of discharge and from where:  09/12/2022 [facility]   Attempts:  1st Attempt  Reason for unsuccessful TCM follow-up call:  Left voice message

## 2022-09-15 ENCOUNTER — Inpatient Hospital Stay: Payer: Medicare Other | Attending: Hematology

## 2022-09-15 DIAGNOSIS — D631 Anemia in chronic kidney disease: Secondary | ICD-10-CM | POA: Insufficient documentation

## 2022-09-15 DIAGNOSIS — D513 Other dietary vitamin B12 deficiency anemia: Secondary | ICD-10-CM

## 2022-09-15 DIAGNOSIS — N189 Chronic kidney disease, unspecified: Secondary | ICD-10-CM | POA: Insufficient documentation

## 2022-09-15 DIAGNOSIS — E559 Vitamin D deficiency, unspecified: Secondary | ICD-10-CM | POA: Insufficient documentation

## 2022-09-15 DIAGNOSIS — D509 Iron deficiency anemia, unspecified: Secondary | ICD-10-CM | POA: Diagnosis not present

## 2022-09-15 DIAGNOSIS — D649 Anemia, unspecified: Secondary | ICD-10-CM

## 2022-09-15 LAB — COMPREHENSIVE METABOLIC PANEL
ALT: 15 U/L (ref 0–44)
AST: 21 U/L (ref 15–41)
Albumin: 4.4 g/dL (ref 3.5–5.0)
Alkaline Phosphatase: 70 U/L (ref 38–126)
Anion gap: 9 (ref 5–15)
BUN: 13 mg/dL (ref 8–23)
CO2: 25 mmol/L (ref 22–32)
Calcium: 9.1 mg/dL (ref 8.9–10.3)
Chloride: 100 mmol/L (ref 98–111)
Creatinine, Ser: 1.1 mg/dL — ABNORMAL HIGH (ref 0.44–1.00)
GFR, Estimated: 54 mL/min — ABNORMAL LOW (ref >60)
Glucose, Bld: 86 mg/dL (ref 70–99)
Potassium: 4.3 mmol/L (ref 3.5–5.1)
Sodium: 134 mmol/L — ABNORMAL LOW (ref 135–145)
Total Bilirubin: 0.3 mg/dL (ref 0.3–1.2)
Total Protein: 8.3 g/dL — ABNORMAL HIGH (ref 6.5–8.1)

## 2022-09-15 LAB — IRON AND TIBC
Iron: 45 ug/dL (ref 28–170)
Saturation Ratios: 15 % (ref 10.4–31.8)
TIBC: 307 ug/dL (ref 250–450)
UIBC: 262 ug/dL

## 2022-09-15 LAB — CBC WITH DIFFERENTIAL/PLATELET
Abs Immature Granulocytes: 0.02 10*3/uL (ref 0.00–0.07)
Basophils Absolute: 0 10*3/uL (ref 0.0–0.1)
Basophils Relative: 0 %
Eosinophils Absolute: 0 10*3/uL (ref 0.0–0.5)
Eosinophils Relative: 1 %
HCT: 34.7 % — ABNORMAL LOW (ref 36.0–46.0)
Hemoglobin: 11.3 g/dL — ABNORMAL LOW (ref 12.0–15.0)
Immature Granulocytes: 0 %
Lymphocytes Relative: 37 %
Lymphs Abs: 2.1 10*3/uL (ref 0.7–4.0)
MCH: 30 pg (ref 26.0–34.0)
MCHC: 32.6 g/dL (ref 30.0–36.0)
MCV: 92 fL (ref 80.0–100.0)
Monocytes Absolute: 0.4 10*3/uL (ref 0.1–1.0)
Monocytes Relative: 7 %
Neutro Abs: 3.2 10*3/uL (ref 1.7–7.7)
Neutrophils Relative %: 55 %
Platelets: 304 10*3/uL (ref 150–400)
RBC: 3.77 MIL/uL — ABNORMAL LOW (ref 3.87–5.11)
RDW: 12.2 % (ref 11.5–15.5)
WBC: 5.7 10*3/uL (ref 4.0–10.5)
nRBC: 0 % (ref 0.0–0.2)

## 2022-09-15 LAB — VITAMIN D 25 HYDROXY (VIT D DEFICIENCY, FRACTURES): Vit D, 25-Hydroxy: 54.09 ng/mL (ref 30–100)

## 2022-09-15 LAB — FERRITIN: Ferritin: 246 ng/mL (ref 11–307)

## 2022-09-15 LAB — VITAMIN B12: Vitamin B-12: 439 pg/mL (ref 180–914)

## 2022-09-18 ENCOUNTER — Ambulatory Visit: Payer: Medicare Other | Admitting: Family

## 2022-09-20 LAB — METHYLMALONIC ACID, SERUM: Methylmalonic Acid, Quantitative: 169 nmol/L (ref 0–378)

## 2022-09-21 NOTE — Progress Notes (Signed)
VIRTUAL VISIT via [place]   I connected with Kristi Barnes  on 09/22/22 at  1:12 PM by telephone and verified that I am speaking with the correct person using two identifiers.  Location: Patient: Home Provider: [facility]   I discussed the limitations, risks, security and privacy concerns of performing an evaluation and management service by telephone and the availability of in person appointments. I also discussed with the patient that there may be a patient responsible charge related to this service. The patient expressed understanding and agreed to proceed.  REASON FOR VISIT:  Follow-up for normocytic anemia secondary to CKD +/- iron deficiency   CURRENT THERAPY: Oral iron supplementation  INTERVAL HISTORY:  Kristi Barnes is contacted today for follow-up of  follow-up of her normocytic anemia.  She was last seen by Tarri Abernethy PA-C on 03/14/2022.   At today's visit, she reports feeling fairly well.   No recent hospitalizations, surgeries, or changes in baseline health status.  She is taking her daily iron pill and weekly vitamin D. She is not taking any B12 supplementation at this time.   She complains of occasional leg cramps.  She says that she has some days where she feels a bit low on energy, but overall denies any severe fatigue.  She has not had any recent headaches, pica, chest pain, dyspnea on exertion, lightheadedness, or syncope. She has not noticed any bright red blood per rectum, melena, epistaxis, or hematemesis.  She reports 75% energy and 90% appetite, and reports that she is maintaining a stable weight at this time.  REVIEW OF SYSTEMS:   Review of Systems  Constitutional:  Positive for malaise/fatigue. Negative for chills, diaphoresis, fever and weight loss.  Respiratory:  Negative for cough and shortness of breath.   Cardiovascular:  Negative for chest pain and palpitations.  Gastrointestinal:  Negative for  abdominal pain, blood in stool, melena, nausea and vomiting.  Musculoskeletal:  Positive for joint pain.  Neurological:  Negative for dizziness and headaches.  Psychiatric/Behavioral:  The patient is nervous/anxious and has insomnia.      PHYSICAL EXAM: (per limitations of virtual telephone visit)  The patient is alert and oriented x 3, exhibiting adequate mentation, good mood, and ability to speak in full sentences and execute sound judgement.  ASSESSMENT & PLAN:  1.  Normocytic anemia +/- iron deficiency - Chronic problem since birth, per patient report - Hgb intermittently low ranging from 11.0 to normal with normal MCV - Colonoscopy (01/30/2020) showed no worrisome findings - SPEP negative.  CRP and ESR negative as well. - She is taking ferrous sulfate 325 mg daily - She has never required IV iron. - No signs of bleeding such as bright red blood per rectum or melena   - She has some leg cramps and occasional headaches.  Otherwise asymptomatic.   - Most recent labs (09/15/2022): Hgb at baseline 11.3/MCV 91.5.  Ferritin 246, iron saturation 15%.  - PLAN: Continue ferrous sulfate 325 mg (65 mg elemental iron), available over-the-counter. - We will repeat labs in 6 months followed by OFFICE visit.   2.  Vitamin B12 deficiency, RESOLVED - Most recent vitamin B12 (09/15/2022) normal at 439 with normal methylmalonic acid - She is not taking any vitamin B12  - PLAN: No indication for vitamin B12 supplementation at this time.   3.  Vitamin D deficiency -The patient was previously taking vitamin D 10,000 units daily - Labs from 09/22/2020  showed elevated vitamin D at 106.75 - She is currently taking vitamin D 50,000 units weekly - Most recent vitamin D (09/15/2022) WNL at 54.09 - PLAN: Continue vitamin D 50,000 units weekly.  We will check levels at next visit in 6 months.   4.  Other history - Non-smoker - No personal history of cancer - Family history of several passing away from lung  cancer.  Patient's sister had leukemia at age 22   PLAN SUMMARY: >> Labs in 6 months (CBC/D, CMP, B12, MMA, ferritin, iron/TIBC, vitamin D) >> OFFICE visit in 6 months (1 week after labs)     I discussed the assessment and treatment plan with the patient. The patient was provided an opportunity to ask questions and all were answered. The patient agreed with the plan and demonstrated an understanding of the instructions.   The patient was advised to call back or seek an in-person evaluation if the symptoms worsen or if the condition fails to improve as anticipated.  I provided 16 minutes of non-face-to-face time during this encounter.   Kristi Rush, PA-C 09/22/22 1:28 PM

## 2022-09-22 ENCOUNTER — Inpatient Hospital Stay (HOSPITAL_BASED_OUTPATIENT_CLINIC_OR_DEPARTMENT_OTHER): Payer: Medicare Other | Admitting: Physician Assistant

## 2022-09-22 DIAGNOSIS — D513 Other dietary vitamin B12 deficiency anemia: Secondary | ICD-10-CM | POA: Diagnosis not present

## 2022-09-22 DIAGNOSIS — D509 Iron deficiency anemia, unspecified: Secondary | ICD-10-CM

## 2022-09-22 DIAGNOSIS — E559 Vitamin D deficiency, unspecified: Secondary | ICD-10-CM

## 2022-09-25 ENCOUNTER — Other Ambulatory Visit: Payer: Self-pay

## 2022-09-25 DIAGNOSIS — E559 Vitamin D deficiency, unspecified: Secondary | ICD-10-CM

## 2022-09-25 DIAGNOSIS — D649 Anemia, unspecified: Secondary | ICD-10-CM

## 2022-09-25 DIAGNOSIS — D509 Iron deficiency anemia, unspecified: Secondary | ICD-10-CM

## 2022-10-10 ENCOUNTER — Ambulatory Visit (INDEPENDENT_AMBULATORY_CARE_PROVIDER_SITE_OTHER): Payer: Medicare Other | Admitting: Family

## 2022-10-10 ENCOUNTER — Encounter: Payer: Self-pay | Admitting: Family

## 2022-10-10 VITALS — BP 126/59 | HR 67 | Temp 97.4°F | Ht 62.0 in | Wt 165.0 lb

## 2022-10-10 DIAGNOSIS — K219 Gastro-esophageal reflux disease without esophagitis: Secondary | ICD-10-CM

## 2022-10-10 DIAGNOSIS — M545 Low back pain, unspecified: Secondary | ICD-10-CM

## 2022-10-10 DIAGNOSIS — M170 Bilateral primary osteoarthritis of knee: Secondary | ICD-10-CM

## 2022-10-10 DIAGNOSIS — K5900 Constipation, unspecified: Secondary | ICD-10-CM | POA: Diagnosis not present

## 2022-10-10 DIAGNOSIS — D539 Nutritional anemia, unspecified: Secondary | ICD-10-CM

## 2022-10-10 DIAGNOSIS — Z23 Encounter for immunization: Secondary | ICD-10-CM

## 2022-10-10 DIAGNOSIS — I10 Essential (primary) hypertension: Secondary | ICD-10-CM

## 2022-10-10 DIAGNOSIS — E7800 Pure hypercholesterolemia, unspecified: Secondary | ICD-10-CM

## 2022-10-10 DIAGNOSIS — Z79899 Other long term (current) drug therapy: Secondary | ICD-10-CM | POA: Diagnosis not present

## 2022-10-10 DIAGNOSIS — E559 Vitamin D deficiency, unspecified: Secondary | ICD-10-CM

## 2022-10-10 DIAGNOSIS — G8929 Other chronic pain: Secondary | ICD-10-CM

## 2022-10-10 MED ORDER — TRAMADOL HCL 50 MG PO TABS: 50.0000 mg | ORAL_TABLET | Freq: Four times a day (QID) | ORAL | 2 refills | Status: DC | PRN

## 2022-10-10 MED ORDER — OMEPRAZOLE 20 MG PO CPDR: 20.0000 mg | DELAYED_RELEASE_CAPSULE | Freq: Every day | ORAL | 0 refills | Status: DC

## 2022-10-10 MED ORDER — AMLODIPINE BESYLATE 10 MG PO TABS: 10.0000 mg | ORAL_TABLET | Freq: Every day | ORAL | 0 refills | Status: DC

## 2022-10-10 NOTE — Progress Notes (Signed)
Subjective:    Patient ID: Kristi Barnes, female    DOB: 04/20/1951, 72 y.o.   MRN: [id number]  Chief Complaint  Patient presents with   Medical Management of Chronic Issues   PT presents to the office today for chronic follow up. She is followed by Hematologists every 6 months for iron deficiency anemia.   Hypertension This is a chronic problem. The current episode started more than 1 year ago. The problem has been resolved since onset. The problem is controlled. Associated symptoms include peripheral edema. Pertinent negatives include no malaise/fatigue or shortness of breath. Risk factors for coronary artery disease include diabetes mellitus, dyslipidemia and sedentary lifestyle. The current treatment provides moderate improvement. There is no history of CVA or heart failure.  Gastroesophageal Reflux She complains of belching, heartburn and a hoarse voice. The current episode started more than 1 year ago. The problem occurs occasionally. Risk factors include obesity. She has tried a PPI for the symptoms. The treatment provided moderate relief.  Arthritis Presents for follow-up visit. She complains of pain and stiffness. Affected locations include the right PIP, left knee and right knee. Her pain is at a severity of 5/10.  Hyperlipidemia This is a chronic problem. The current episode started more than 1 year ago. Exacerbating diseases include obesity. Pertinent negatives include no shortness of breath. Current antihyperlipidemic treatment includes statins. The current treatment provides moderate improvement of lipids. Risk factors for coronary artery disease include dyslipidemia, hypertension, a sedentary lifestyle and post-menopausal.  Anemia Presents for follow-up visit. There has been no malaise/fatigue. There is no history of heart failure.  Constipation This is a chronic problem. The current episode started more than 1 year ago. The problem has been resolved since onset. Her stool  frequency is 1 time per day. Risk factors include obesity. She has tried laxatives for the symptoms. The treatment provided moderate relief.      Review of Systems  Constitutional:  Negative for malaise/fatigue.  HENT:  Positive for hoarse voice.   Respiratory:  Negative for shortness of breath.   Gastrointestinal:  Positive for constipation and heartburn.  Musculoskeletal:  Positive for arthritis and stiffness.  All other systems reviewed and are negative.      Objective:   Physical Exam Vitals reviewed.  Constitutional:      General: She is not in acute distress.    Appearance: She is well-developed. She is obese.  HENT:     Head: Normocephalic and atraumatic.     Right Ear: Tympanic membrane normal.     Left Ear: Tympanic membrane normal.  Eyes:     Pupils: Pupils are equal, round, and reactive to light.  Neck:     Thyroid: No thyromegaly.  Cardiovascular:     Rate and Rhythm: Normal rate and regular rhythm.     Heart sounds: Normal heart sounds. No murmur heard. Pulmonary:     Effort: Pulmonary effort is normal. No respiratory distress.     Breath sounds: Normal breath sounds. No wheezing.  Abdominal:     General: Bowel sounds are normal. There is no distension.     Palpations: Abdomen is soft.     Tenderness: There is no abdominal tenderness.  Musculoskeletal:        General: No tenderness. Normal range of motion.     Cervical back: Normal range of motion and neck supple.  Skin:    General: Skin is warm and dry.  Neurological:     Mental Status: She is alert  and oriented to person, place, and time.     Cranial Nerves: No cranial nerve deficit.     Deep Tendon Reflexes: Reflexes are normal and symmetric.  Psychiatric:        Behavior: Behavior normal.        Thought Content: Thought content normal.        Judgment: Judgment normal.       BP (!) 126/59   Pulse 67   Temp (!) 97.4 F (36.3 C) (Temporal)   Ht 5' 2"$  (1.575 m)   Wt 165 lb (74.8 kg)   BMI  30.18 kg/m      Assessment & Plan:  Kristi Barnes [name]   Diagnosis and orders addressed:  1. Primary hypertension - amLODipine (NORVASC) 10 MG tablet; Take 1 tablet (10 mg total) by mouth daily.  Dispense: 90 tablet; Refill: 0  2. Gastroesophageal reflux disease without esophagitis - omeprazole (PRILOSEC) 20 MG capsule; Take 1 capsule (20 mg total) by mouth daily.  Dispense: 90 capsule; Refill: 0  3. Constipation, unspecified constipation type  4. Controlled substance agreement signed - traMADol (ULTRAM) 50 MG tablet; Take 1-2 tablets (50-100 mg total) by mouth every 6 (six) hours as needed. for pain  Dispense: 120 tablet; Refill: 2  5. Deficiency anemia  6. Pure hypercholesterolemia  7. Primary osteoarthritis of both knees - traMADol (ULTRAM) 50 MG tablet; Take 1-2 tablets (50-100 mg total) by mouth every 6 (six) hours as needed. for pain  Dispense: 120 tablet; Refill: 2  8. Vitamin D deficiency   9. Chronic midline low back pain without sciatica - traMADol (ULTRAM) 50 MG tablet; Take 1-2 tablets (50-100 mg total) by mouth every 6 (six) hours as needed. for pain  Dispense: 120 tablet; Refill: 2   Labs reviewed from Hematologists Patient reviewed in [place] controlled database, no flags noted. Contract and drug screen are up to date.   Health Maintenance reviewed Diet and exercise encouraged  Follow up plan: 3 months   Evelina Dun, FNP

## 2022-10-10 NOTE — Patient Instructions (Signed)
Health Maintenance After Age 70 After age 70, you are at a higher risk for certain long-term diseases and infections as well as injuries from falls. Falls are a major cause of broken bones and head injuries in people who are older than age 70. Getting regular preventive care can help to keep you healthy and well. Preventive care includes getting regular testing and making lifestyle changes as recommended by your health care provider. Talk with your health care provider about: Which screenings and tests you should have. A screening is a test that checks for a disease when you have no symptoms. A diet and exercise plan that is right for you. What should I know about screenings and tests to prevent falls? Screening and testing are the best ways to find a health problem early. Early diagnosis and treatment give you the best chance of managing medical conditions that are common after age 70. Certain conditions and lifestyle choices may make you more likely to have a fall. Your health care provider may recommend: Regular vision checks. Poor vision and conditions such as cataracts can make you more likely to have a fall. If you wear glasses, make sure to get your prescription updated if your vision changes. Medicine review. Work with your health care provider to regularly review all of the medicines you are taking, including over-the-counter medicines. Ask your health care provider about any side effects that may make you more likely to have a fall. Tell your health care provider if any medicines that you take make you feel dizzy or sleepy. Strength and balance checks. Your health care provider may recommend certain tests to check your strength and balance while standing, walking, or changing positions. Foot health exam. Foot pain and numbness, as well as not wearing proper footwear, can make you more likely to have a fall. Screenings, including: Osteoporosis screening. Osteoporosis is a condition that causes  the bones to get weaker and break more easily. Blood pressure screening. Blood pressure changes and medicines to control blood pressure can make you feel dizzy. Depression screening. You may be more likely to have a fall if you have a fear of falling, feel depressed, or feel unable to do activities that you used to do. Alcohol use screening. Using too much alcohol can affect your balance and may make you more likely to have a fall. Follow these instructions at home: Lifestyle Do not drink alcohol if: Your health care provider tells you not to drink. If you drink alcohol: Limit how much you have to: 0-1 drink a day for women. 0-2 drinks a day for men. Know how much alcohol is in your drink. In the [place] one drink equals one 12 oz bottle of beer (355 mL), one 5 oz glass of wine (148 mL), or one 1 oz glass of hard liquor (44 mL). Do not use any products that contain nicotine or tobacco. These products include cigarettes, chewing tobacco, and vaping devices, such as e-cigarettes. If you need help quitting, ask your health care provider. Activity  Follow a regular exercise program to stay fit. This will help you maintain your balance. Ask your health care provider what types of exercise are appropriate for you. If you need a cane or walker, use it as recommended by your health care provider. Wear supportive shoes that have nonskid soles. Safety  Remove any tripping hazards, such as rugs, cords, and clutter. Install safety equipment such as grab bars in bathrooms and safety rails on stairs. Keep rooms and walkways   well-lit. General instructions Talk with your health care provider about your risks for falling. Tell your health care provider if: You fall. Be sure to tell your health care provider about all falls, even ones that seem minor. You feel dizzy, tiredness (fatigue), or off-balance. Take over-the-counter and prescription medicines only as told by your health care provider. These include  supplements. Eat a healthy diet and maintain a healthy weight. A healthy diet includes low-fat dairy products, low-fat (lean) meats, and fiber from whole grains, beans, and lots of fruits and vegetables. Stay current with your vaccines. Schedule regular health, dental, and eye exams. Summary Having a healthy lifestyle and getting preventive care can help to protect your health and wellness after age 70. Screening and testing are the best way to find a health problem early and help you avoid having a fall. Early diagnosis and treatment give you the best chance for managing medical conditions that are more common for people who are older than age 70. Falls are a major cause of broken bones and head injuries in people who are older than age 70. Take precautions to prevent a fall at home. Work with your health care provider to learn what changes you can make to improve your health and wellness and to prevent falls. This information is not intended to replace advice given to you by your health care provider. Make sure you discuss any questions you have with your health care provider. Document Revised: 01/03/2021 Document Reviewed: 01/03/2021 Elsevier Patient Education  2023 Elsevier Inc.  

## 2022-12-04 ENCOUNTER — Other Ambulatory Visit: Payer: Self-pay | Admitting: Family

## 2022-12-04 DIAGNOSIS — E7800 Pure hypercholesterolemia, unspecified: Secondary | ICD-10-CM

## 2023-01-09 ENCOUNTER — Encounter: Payer: Self-pay | Admitting: Family

## 2023-01-09 ENCOUNTER — Ambulatory Visit (INDEPENDENT_AMBULATORY_CARE_PROVIDER_SITE_OTHER): Payer: Medicare Other | Admitting: Family

## 2023-01-09 VITALS — BP 128/73 | HR 59 | Temp 97.4°F | Ht 62.0 in | Wt 162.0 lb

## 2023-01-09 DIAGNOSIS — R232 Flushing: Secondary | ICD-10-CM

## 2023-01-09 DIAGNOSIS — Z79899 Other long term (current) drug therapy: Secondary | ICD-10-CM

## 2023-01-09 DIAGNOSIS — E7800 Pure hypercholesterolemia, unspecified: Secondary | ICD-10-CM

## 2023-01-09 DIAGNOSIS — D539 Nutritional anemia, unspecified: Secondary | ICD-10-CM

## 2023-01-09 DIAGNOSIS — M545 Low back pain, unspecified: Secondary | ICD-10-CM

## 2023-01-09 DIAGNOSIS — K5900 Constipation, unspecified: Secondary | ICD-10-CM | POA: Diagnosis not present

## 2023-01-09 DIAGNOSIS — I10 Essential (primary) hypertension: Secondary | ICD-10-CM | POA: Diagnosis not present

## 2023-01-09 DIAGNOSIS — K219 Gastro-esophageal reflux disease without esophagitis: Secondary | ICD-10-CM

## 2023-01-09 DIAGNOSIS — E559 Vitamin D deficiency, unspecified: Secondary | ICD-10-CM

## 2023-01-09 DIAGNOSIS — M170 Bilateral primary osteoarthritis of knee: Secondary | ICD-10-CM

## 2023-01-09 DIAGNOSIS — G8929 Other chronic pain: Secondary | ICD-10-CM

## 2023-01-09 MED ORDER — ESCITALOPRAM OXALATE 10 MG PO TABS: 10.0000 mg | ORAL_TABLET | Freq: Every day | ORAL | 3 refills | Status: DC

## 2023-01-09 MED ORDER — TRAMADOL HCL 50 MG PO TABS: 50.0000 mg | ORAL_TABLET | Freq: Four times a day (QID) | ORAL | 2 refills | Status: DC | PRN

## 2023-01-09 MED ORDER — AMLODIPINE BESYLATE 10 MG PO TABS: 10.0000 mg | ORAL_TABLET | Freq: Every day | ORAL | 0 refills | Status: DC

## 2023-01-09 NOTE — Progress Notes (Signed)
Subjective:    Patient ID: Kristi Barnes, female    DOB: [birth year], 72 y.o.   MRN: [id number]  Chief Complaint  Patient presents with   Medication Refill   PT presents to the office today for chronic follow up. She is followed by Hematologists every 6 months for iron deficiency anemia.    Complaining of hot flashes and irritability.  Hypertension This is a chronic problem. The current episode started more than 1 year ago. The problem has been resolved since onset. The problem is controlled. Associated symptoms include malaise/fatigue and shortness of breath. Pertinent negatives include no peripheral edema. Risk factors for coronary artery disease include dyslipidemia, obesity and sedentary lifestyle. The current treatment provides moderate improvement.  Gastroesophageal Reflux She complains of belching, heartburn and a hoarse voice. This is a chronic problem. The current episode started more than 1 year ago. The problem occurs occasionally. She has tried a PPI for the symptoms. The treatment provided moderate relief.  Arthritis Presents for follow-up visit. She complains of pain and stiffness. The symptoms have been stable. Affected locations include the left knee, right knee, left MCP, right MCP, right shoulder and left shoulder. Her pain is at a severity of 6/10.  Hyperlipidemia This is a chronic problem. The current episode started more than 1 year ago. Exacerbating diseases include obesity. Associated symptoms include shortness of breath. Current antihyperlipidemic treatment includes statins. The current treatment provides moderate improvement of lipids. Risk factors for coronary artery disease include dyslipidemia, hypertension, a sedentary lifestyle and post-menopausal.  Anemia Presents for follow-up visit. Symptoms include malaise/fatigue.  Constipation This is a chronic problem. The current episode started more than 1 year ago. The problem has been resolved since onset. She has tried  laxatives for the symptoms. The treatment provided moderate relief.    Current opioids rx- Ultram 50 mg  # meds rx- 120  Effectiveness of current meds-stable Adverse reactions from pain meds-None Morphine equivalent- 8   Pill count performed-No Last drug screen - 06/15/22 ( high risk q87m, moderate risk q50m, low risk yearly ) Urine drug screen today- No Was the [facility] reviewed- Yes             If yes were their any concerning findings? - none   Pain contract signed on: 06/15/22    Review of Systems  Constitutional:  Positive for malaise/fatigue.  HENT:  Positive for hoarse voice.   Respiratory:  Positive for shortness of breath.   Gastrointestinal:  Positive for constipation and heartburn.  Musculoskeletal:  Positive for arthritis and stiffness.  All other systems reviewed and are negative.      Objective:   Physical Exam Vitals reviewed.  Constitutional:      General: She is not in acute distress.    Appearance: She is well-developed. She is obese.  HENT:     Head: Normocephalic and atraumatic.     Right Ear: Tympanic membrane normal.     Left Ear: Tympanic membrane normal.  Eyes:     Pupils: Pupils are equal, round, and reactive to light.  Neck:     Thyroid: No thyromegaly.  Cardiovascular:     Rate and Rhythm: Normal rate and regular rhythm.     Heart sounds: Normal heart sounds. No murmur heard. Pulmonary:     Effort: Pulmonary effort is normal. No respiratory distress.     Breath sounds: Normal breath sounds. No wheezing.  Abdominal:     General: Bowel sounds are normal. There is no distension.  Palpations: Abdomen is soft.     Tenderness: There is no abdominal tenderness.  Musculoskeletal:        General: No tenderness. Normal range of motion.     Cervical back: Normal range of motion and neck supple.  Skin:    General: Skin is warm and dry.  Neurological:     Mental Status: She is alert and oriented to person, place, and time.     Cranial Nerves: No  cranial nerve deficit.     Deep Tendon Reflexes: Reflexes are normal and symmetric.  Psychiatric:        Behavior: Behavior normal.        Thought Content: Thought content normal.        Judgment: Judgment normal.       BP 128/73   Pulse (!) 59   Temp (!) 97.4 F (36.3 C) (Temporal)   Ht 5\' 2"  (1.575 m)   Wt 162 lb (73.5 kg)   SpO2 98%   BMI 29.63 kg/m      Assessment & Plan:   Kristi Barnes comes in today with chief complaint of Medication Refill   Diagnosis and orders addressed:  1. Primary hypertension - amLODipine (NORVASC) 10 MG tablet; Take 1 tablet (10 mg total) by mouth daily.  Dispense: 90 tablet; Refill: 0  2. Primary osteoarthritis of both knees - traMADol (ULTRAM) 50 MG tablet; Take 1-2 tablets (50-100 mg total) by mouth every 6 (six) hours as needed. for pain  Dispense: 120 tablet; Refill: 2  3. Controlled substance agreement signed - traMADol (ULTRAM) 50 MG tablet; Take 1-2 tablets (50-100 mg total) by mouth every 6 (six) hours as needed. for pain  Dispense: 120 tablet; Refill: 2  4. Chronic midline low back pain without sciatica - traMADol (ULTRAM) 50 MG tablet; Take 1-2 tablets (50-100 mg total) by mouth every 6 (six) hours as needed. for pain  Dispense: 120 tablet; Refill: 2  5. Constipation, unspecified constipation type  6. Deficiency anemia  7. Gastroesophageal reflux disease without esophagitis  8. Pure hypercholesterolemia  9. Vitamin D deficiency  10. Hot flashes - escitalopram (LEXAPRO) 10 MG tablet; Take 1 tablet (10 mg total) by mouth daily.  Dispense: 90 tablet; Refill: 3   Labs pending Start Lexapro 10 mg today to help with hot flashes Patient reviewed in [place] controlled database, no flags noted. Contract and drug screen are up to date.  Health Maintenance reviewed Diet and exercise encouraged  Follow up plan: 3 months    Jannifer Rodney, FNP

## 2023-01-09 NOTE — Patient Instructions (Signed)
Health Maintenance After Age 70 After age 70, you are at a higher risk for certain long-term diseases and infections as well as injuries from falls. Falls are a major cause of broken bones and head injuries in people who are older than age 70. Getting regular preventive care can help to keep you healthy and well. Preventive care includes getting regular testing and making lifestyle changes as recommended by your health care provider. Talk with your health care provider about: Which screenings and tests you should have. A screening is a test that checks for a disease when you have no symptoms. A diet and exercise plan that is right for you. What should I know about screenings and tests to prevent falls? Screening and testing are the best ways to find a health problem early. Early diagnosis and treatment give you the best chance of managing medical conditions that are common after age 70. Certain conditions and lifestyle choices may make you more likely to have a fall. Your health care provider may recommend: Regular vision checks. Poor vision and conditions such as cataracts can make you more likely to have a fall. If you wear glasses, make sure to get your prescription updated if your vision changes. Medicine review. Work with your health care provider to regularly review all of the medicines you are taking, including over-the-counter medicines. Ask your health care provider about any side effects that may make you more likely to have a fall. Tell your health care provider if any medicines that you take make you feel dizzy or sleepy. Strength and balance checks. Your health care provider may recommend certain tests to check your strength and balance while standing, walking, or changing positions. Foot health exam. Foot pain and numbness, as well as not wearing proper footwear, can make you more likely to have a fall. Screenings, including: Osteoporosis screening. Osteoporosis is a condition that causes  the bones to get weaker and break more easily. Blood pressure screening. Blood pressure changes and medicines to control blood pressure can make you feel dizzy. Depression screening. You may be more likely to have a fall if you have a fear of falling, feel depressed, or feel unable to do activities that you used to do. Alcohol use screening. Using too much alcohol can affect your balance and may make you more likely to have a fall. Follow these instructions at home: Lifestyle Do not drink alcohol if: Your health care provider tells you not to drink. If you drink alcohol: Limit how much you have to: 0-1 drink a day for women. 0-2 drinks a day for men. Know how much alcohol is in your drink. In the [place] one drink equals one 12 oz bottle of beer (355 mL), one 5 oz glass of wine (148 mL), or one 1 oz glass of hard liquor (44 mL). Do not use any products that contain nicotine or tobacco. These products include cigarettes, chewing tobacco, and vaping devices, such as e-cigarettes. If you need help quitting, ask your health care provider. Activity  Follow a regular exercise program to stay fit. This will help you maintain your balance. Ask your health care provider what types of exercise are appropriate for you. If you need a cane or walker, use it as recommended by your health care provider. Wear supportive shoes that have nonskid soles. Safety  Remove any tripping hazards, such as rugs, cords, and clutter. Install safety equipment such as grab bars in bathrooms and safety rails on stairs. Keep rooms and walkways   well-lit. General instructions Talk with your health care provider about your risks for falling. Tell your health care provider if: You fall. Be sure to tell your health care provider about all falls, even ones that seem minor. You feel dizzy, tiredness (fatigue), or off-balance. Take over-the-counter and prescription medicines only as told by your health care provider. These include  supplements. Eat a healthy diet and maintain a healthy weight. A healthy diet includes low-fat dairy products, low-fat (lean) meats, and fiber from whole grains, beans, and lots of fruits and vegetables. Stay current with your vaccines. Schedule regular health, dental, and eye exams. Summary Having a healthy lifestyle and getting preventive care can help to protect your health and wellness after age 70. Screening and testing are the best way to find a health problem early and help you avoid having a fall. Early diagnosis and treatment give you the best chance for managing medical conditions that are more common for people who are older than age 70. Falls are a major cause of broken bones and head injuries in people who are older than age 70. Take precautions to prevent a fall at home. Work with your health care provider to learn what changes you can make to improve your health and wellness and to prevent falls. This information is not intended to replace advice given to you by your health care provider. Make sure you discuss any questions you have with your health care provider. Document Revised: 01/03/2021 Document Reviewed: 01/03/2021 Elsevier Patient Education  2023 Elsevier Inc.  

## 2023-01-24 ENCOUNTER — Ambulatory Visit (INDEPENDENT_AMBULATORY_CARE_PROVIDER_SITE_OTHER): Payer: Medicare Other

## 2023-01-24 VITALS — Ht 62.0 in | Wt 162.0 lb

## 2023-01-24 DIAGNOSIS — Z780 Asymptomatic menopausal state: Secondary | ICD-10-CM

## 2023-01-24 DIAGNOSIS — Z0000 Encounter for general adult medical examination without abnormal findings: Secondary | ICD-10-CM

## 2023-01-24 NOTE — Patient Instructions (Signed)
[name] , Thank you for taking time to come for your Medicare Wellness Visit. I appreciate your ongoing commitment to your health goals. Please review the following plan we discussed and let me know if I can assist you in the future.   These are the goals we discussed:  Goals      DIET - INCREASE WATER INTAKE     Try to drink 6-8 glasses of water daily        This is a list of the screening recommended for you and due dates:  Health Maintenance  Topic Date Due   DEXA scan (bone density measurement)  10/11/2023*   Mammogram  02/25/2023   Flu Shot  03/29/2023   Medicare Annual Wellness Visit  01/24/2024   Colon Cancer Screening  02/18/2030   DTaP/Tdap/Td vaccine (3 - Td or Tdap) 12/14/2031   Pneumonia Vaccine  Completed   Hepatitis C Screening  Completed   Zoster (Shingles) Vaccine  Completed   HPV Vaccine  Aged Out   COVID-19 Vaccine  Discontinued  *Topic was postponed. The date shown is not the original due date.    Advanced directives: Advance directive discussed with you today. I have provided a copy for you to complete at home and have notarized. Once this is complete please bring a copy in to our office so we can scan it into your chart.   Conditions/risks identified: Aim for 30 minutes of exercise or brisk walking, 6-8 glasses of water, and 5 servings of fruits and vegetables each day.   Next appointment: Follow up in one year for your annual wellness visit    Preventive Care 65 Years and Older, Female Preventive care refers to lifestyle choices and visits with your health care provider that can promote health and wellness. What does preventive care include? A yearly physical exam. This is also called an annual well check. Dental exams once or twice a year. Routine eye exams. Ask your health care provider how often you should have your eyes checked. Personal lifestyle choices, including: Daily care of your teeth and gums. Regular physical activity. Eating a  healthy diet. Avoiding tobacco and drug use. Limiting alcohol use. Practicing safe sex. Taking low-dose aspirin every day. Taking vitamin and mineral supplements as recommended by your health care provider. What happens during an annual well check? The services and screenings done by your health care provider during your annual well check will depend on your age, overall health, lifestyle risk factors, and family history of disease. Counseling  Your health care provider may ask you questions about your: Alcohol use. Tobacco use. Drug use. Emotional well-being. Home and relationship well-being. Sexual activity. Eating habits. History of falls. Memory and ability to understand (cognition). Work and work Astronomer. Reproductive health. Screening  You may have the following tests or measurements: Height, weight, and BMI. Blood pressure. Lipid and cholesterol levels. These may be checked every 5 years, or more frequently if you are over 33 years old. Skin check. Lung cancer screening. You may have this screening every year starting at age 67 if you have a 30-pack-year history of smoking and currently smoke or have quit within the past 15 years. Fecal occult blood test (FOBT) of the stool. You may have this test every year starting at age 34. Flexible sigmoidoscopy or colonoscopy. You may have a sigmoidoscopy every 5 years or a colonoscopy every 10 years starting at age 57. Hepatitis C blood test. Hepatitis B blood test. Sexually transmitted disease (STD) testing.  Diabetes screening. This is done by checking your blood sugar (glucose) after you have not eaten for a while (fasting). You may have this done every 1-3 years. Bone density scan. This is done to screen for osteoporosis. You may have this done starting at age 67. Mammogram. This may be done every 1-2 years. Talk to your health care provider about how often you should have regular mammograms. Talk with your health care  provider about your test results, treatment options, and if necessary, the need for more tests. Vaccines  Your health care provider may recommend certain vaccines, such as: Influenza vaccine. This is recommended every year. Tetanus, diphtheria, and acellular pertussis (Tdap, Td) vaccine. You may need a Td booster every 10 years. Zoster vaccine. You may need this after age 63. Pneumococcal 13-valent conjugate (PCV13) vaccine. One dose is recommended after age 44. Pneumococcal polysaccharide (PPSV23) vaccine. One dose is recommended after age 66. Talk to your health care provider about which screenings and vaccines you need and how often you need them. This information is not intended to replace advice given to you by your health care provider. Make sure you discuss any questions you have with your health care provider. Document Released: 09/10/2015 Document Revised: 05/03/2016 Document Reviewed: 06/15/2015 Elsevier Interactive Patient Education  2017 [employer].  Fall Prevention in the Home Falls can cause injuries. They can happen to people of all ages. There are many things you can do to make your home safe and to help prevent falls. What can I do on the outside of my home? Regularly fix the edges of walkways and driveways and fix any cracks. Remove anything that might make you trip as you walk through a door, such as a raised step or threshold. Trim any bushes or trees on the path to your home. Use bright outdoor lighting. Clear any walking paths of anything that might make someone trip, such as rocks or tools. Regularly check to see if handrails are loose or broken. Make sure that both sides of any steps have handrails. Any raised decks and porches should have guardrails on the edges. Have any leaves, snow, or ice cleared regularly. Use sand or salt on walking paths during winter. Clean up any spills in your garage right away. This includes oil or grease spills. What can I do in the  bathroom? Use night lights. Install grab bars by the toilet and in the tub and shower. Do not use towel bars as grab bars. Use non-skid mats or decals in the tub or shower. If you need to sit down in the shower, use a plastic, non-slip stool. Keep the floor dry. Clean up any water that spills on the floor as soon as it happens. Remove soap buildup in the tub or shower regularly. Attach bath mats securely with double-sided non-slip rug tape. Do not have throw rugs and other things on the floor that can make you trip. What can I do in the bedroom? Use night lights. Make sure that you have a light by your bed that is easy to reach. Do not use any sheets or blankets that are too big for your bed. They should not hang down onto the floor. Have a firm chair that has side arms. You can use this for support while you get dressed. Do not have throw rugs and other things on the floor that can make you trip. What can I do in the kitchen? Clean up any spills right away. Avoid walking on wet floors.  Keep items that you use a lot in easy-to-reach places. If you need to reach something above you, use a strong step stool that has a grab bar. Keep electrical cords out of the way. Do not use floor polish or wax that makes floors slippery. If you must use wax, use non-skid floor wax. Do not have throw rugs and other things on the floor that can make you trip. What can I do with my stairs? Do not leave any items on the stairs. Make sure that there are handrails on both sides of the stairs and use them. Fix handrails that are broken or loose. Make sure that handrails are as long as the stairways. Check any carpeting to make sure that it is firmly attached to the stairs. Fix any carpet that is loose or worn. Avoid having throw rugs at the top or bottom of the stairs. If you do have throw rugs, attach them to the floor with carpet tape. Make sure that you have a light switch at the top of the stairs and the  bottom of the stairs. If you do not have them, ask someone to add them for you. What else can I do to help prevent falls? Wear shoes that: Do not have high heels. Have rubber bottoms. Are comfortable and fit you well. Are closed at the toe. Do not wear sandals. If you use a stepladder: Make sure that it is fully opened. Do not climb a closed stepladder. Make sure that both sides of the stepladder are locked into place. Ask someone to hold it for you, if possible. Clearly mark and make sure that you can see: Any grab bars or handrails. First and last steps. Where the edge of each step is. Use tools that help you move around (mobility aids) if they are needed. These include: Canes. Walkers. Scooters. Crutches. Turn on the lights when you go into a dark area. Replace any light bulbs as soon as they burn out. Set up your furniture so you have a clear path. Avoid moving your furniture around. If any of your floors are uneven, fix them. If there are any pets around you, be aware of where they are. Review your medicines with your doctor. Some medicines can make you feel dizzy. This can increase your chance of falling. Ask your doctor what other things that you can do to help prevent falls. This information is not intended to replace advice given to you by your health care provider. Make sure you discuss any questions you have with your health care provider. Document Released: 06/10/2009 Document Revised: 01/20/2016 Document Reviewed: 09/18/2014 Elsevier Interactive Patient Education  2017 [employer].

## 2023-01-24 NOTE — Progress Notes (Signed)
Subjective:   Kristi Barnes is a 72 y.o. female who presents for Medicare Annual (Subsequent) preventive examination. I connected with  Kristi Barnes on 01/24/23 by a audio enabled telemedicine application and verified that I am speaking with the correct person using two identifiers.  Patient Location: Home  Provider Location: Home Office  I discussed the limitations of evaluation and management by telemedicine. The patient expressed understanding and agreed to proceed.  Review of Systems     Cardiac Risk Factors include: advanced age (>66men, >8 women);dyslipidemia;hypertension     Objective:    Today's Vitals   01/24/23 1032  Weight: 162 lb (73.5 kg)  Height: 5\' 2"  (1.575 m)   Body mass index is 29.63 kg/m.     01/24/2023   10:37 AM 09/22/2022   12:00 PM 07/10/2022    1:40 PM 06/19/2022    6:46 AM 06/12/2022    2:09 PM 03/14/2022    2:12 PM 01/19/2022   10:41 AM  Advanced Directives  Does Patient Have a Medical Advance Directive? No No No No No No No  Would patient like information on creating a medical advance directive? No - Patient declined No - Patient declined No - Patient declined No - Patient declined No - Patient declined No - Patient declined No - Patient declined    Current Medications (verified) Outpatient Encounter Medications as of 01/24/2023  Medication Sig   amLODipine (NORVASC) 10 MG tablet Take 1 tablet (10 mg total) by mouth daily.   Calcium Carbonate-Vit D-Min (CALCIUM 1200 PO) Take by mouth.   chlorhexidine (PERIDEX) 0.12 % solution SMARTSIG:15 Milliliter(s) By Mouth   diclofenac Sodium (VOLTAREN) 1 % GEL APPLY TWO GRAMS TOPICALLY FOUR TIMES DAILY   escitalopram (LEXAPRO) 10 MG tablet Take 1 tablet (10 mg total) by mouth daily.   Ferrous Sulfate (IRON) 325 (65 Fe) MG TABS Take by mouth.   latanoprost (XALATAN) 0.005 % ophthalmic solution 1 drop at bedtime.   omeprazole (PRILOSEC) 20 MG capsule Take 1 capsule (20 mg total) by mouth daily.    simvastatin (ZOCOR) 40 MG tablet TAKE ONE TABLET BY MOUTH ONCE DAILY   traMADol (ULTRAM) 50 MG tablet Take 1-2 tablets (50-100 mg total) by mouth every 6 (six) hours as needed. for pain   Vitamin D, Ergocalciferol, (DRISDOL) 1.25 MG (50000 UNIT) CAPS capsule TAKE ONE CAPSULE BY MOUTH EVERY 7 DAYS.   vitamin E 180 MG (400 UNITS) capsule Take 400 Units by mouth daily.    No facility-administered encounter medications on file as of 01/24/2023.    Allergies (verified) Asa [aspirin]   History: Past Medical History:  Diagnosis Date   Arthritis    GERD (gastroesophageal reflux disease)    Glaucoma    Hyperlipidemia    Hypertension    Vitamin D deficiency    Past Surgical History:  Procedure Laterality Date   bunions Bilateral    CATARACT EXTRACTION W/PHACO Right 06/19/2022   Procedure: CATARACT EXTRACTION PHACO AND INTRAOCULAR LENS PLACEMENT (IOC);  Surgeon: Fabio Pierce, MD;  Location: [facility];  Service: Ophthalmology;  Laterality: Right;  CDE 8.00   CATARACT EXTRACTION W/PHACO Left 07/10/2022   Procedure: CATARACT EXTRACTION PHACO AND INTRAOCULAR LENS PLACEMENT (IOC);  Surgeon: Fabio Pierce, MD;  Location: [facility];  Service: Ophthalmology;  Laterality: Left;  CDE 7.07   COLONOSCOPY  01/2013   Dr. Mikal Plane: Fentanyl 150 mcg/Versed 6 mg, sessile polyp removed from the cecum, tortuous rectosigmoid colon.  Pathology revealed focal hyperplastic change, lymphoid aggregate  of second fragment with colonic tissue.  Advised to have another colonoscopy in 5 to 10 years.   COLONOSCOPY WITH PROPOFOL N/A 08/26/2019   Dr. Darrick Penna: INCOMPLETE DUE TO INADEQUATE BOWEL PREP. stool throughout the colon. hemorrhoids noted.   COLONOSCOPY WITH PROPOFOL N/A 02/19/2020   Procedure: COLONOSCOPY WITH PROPOFOL;  Surgeon: Corbin Ade, MD;  Location: [facility];  Service: Endoscopy;  Laterality: N/A;  2:30pm   Family History  Problem Relation Age of Onset   Heart attack Mother    Cancer Father         lung   Leukemia Sister    Cancer Brother        back of neck   Cancer Sister        back of neck   Colon cancer Neg Hx    Social History   Socioeconomic History   Marital status: Divorced    Spouse name: Not on file   Number of children: 2   Years of education: 12   Highest education level: High school graduate  Occupational History   Occupation: retired  Tobacco Use   Smoking status: Never   Smokeless tobacco: Never  Building services engineer Use: Never used  Substance and Sexual Activity   Alcohol use: No   Drug use: No   Sexual activity: Not Currently  Other Topics Concern   Not on file  Social History Narrative   Lives alone. Twin sister lives next door. Lots of family nearby   Social Determinants of Health   Financial Resource Strain: Low Risk  (01/24/2023)   Overall Financial Resource Strain (CARDIA)    Difficulty of Paying Living Expenses: Not hard at all  Food Insecurity: No Food Insecurity (01/24/2023)   Hunger Vital Sign    Worried About Running Out of Food in the Last Year: Never true    Ran Out of Food in the Last Year: Never true  Transportation Needs: No Transportation Needs (01/24/2023)   PRAPARE - Administrator, Civil Service (Medical): No    Lack of Transportation (Non-Medical): No  Physical Activity: Sufficiently Active (01/24/2023)   Exercise Vital Sign    Days of Exercise per Week: 5 days    Minutes of Exercise per Session: 60 min  Stress: No Stress Concern Present (01/24/2023)   [employer] of Occupational Health - Occupational Stress Questionnaire    Feeling of Stress : Not at all  Social Connections: Moderately Isolated (01/24/2023)   Social Connection and Isolation Panel [NHANES]    Frequency of Communication with Friends and Family: More than three times a week    Frequency of Social Gatherings with Friends and Family: More than three times a week    Attends Religious Services: More than 4 times per year    Active Member of [employer]  or Organizations: No    Attends Engineer, structural: Never    Marital Status: Divorced    Tobacco Counseling Counseling given: Not Answered   Clinical Intake:  Pre-visit preparation completed: Yes  Pain : No/denies pain     Nutritional Risks: None Diabetes: No  How often do you need to have someone help you when you read instructions, pamphlets, or other written materials from your doctor or pharmacy?: 1 - Never  Diabetic?no   Interpreter Needed?: No  Information entered by :: Renie Ora, LPN   Activities of Daily Living    01/24/2023   10:37 AM 06/12/2022    2:09 PM  In your present state of health, do you have any difficulty performing the following activities:  Hearing? 0 0  Vision? 0 0  Difficulty concentrating or making decisions? 0 0  Walking or climbing stairs? 0 0  Dressing or bathing? 0 0  Doing errands, shopping? 0   Preparing Food and eating ? N   Using the Toilet? N   In the past six months, have you accidently leaked urine? N   Do you have problems with loss of bowel control? N   Managing your Medications? N   Managing your Finances? N   Housekeeping or managing your Housekeeping? N     Patient Care Team: Junie Spencer, FNP as PCP - General (Family Medicine) West Bali, MD (Inactive) as Consulting Physician (Gastroenterology)  Indicate any recent Medical Services you may have received from other than [facility] providers in the past year (date may be approximate).     Assessment:   This is a routine wellness examination for [facility].  Hearing/Vision screen Vision Screening - Comments:: Wears rx glasses - up to date with routine eye exams with  Dr.Davis   Dietary issues and exercise activities discussed: Current Exercise Habits: Home exercise routine, Type of exercise: walking, Time (Minutes): 60, Frequency (Times/Week): 5, Weekly Exercise (Minutes/Week): 300, Intensity: Mild, Exercise limited by: None identified   Goals Addressed              This Visit's Progress    DIET - INCREASE WATER INTAKE   On track    Try to drink 6-8 glasses of water daily       Depression Screen    01/24/2023   10:35 AM 01/09/2023   11:35 AM 03/13/2022   12:59 PM 03/13/2022   12:21 PM 03/13/2022   12:15 PM 01/19/2022   10:35 AM 12/13/2021    2:13 PM  PHQ 2/9 Scores  PHQ - 2 Score 0 2 4 0 0 2 0  PHQ- 9 Score 0 11 4 3  4  0    Fall Risk    01/24/2023   10:33 AM 01/09/2023   11:35 AM 03/13/2022   12:59 PM 03/13/2022   12:15 PM 01/19/2022   10:32 AM  Fall Risk   Falls in the past year? 0 1 1 1 1   Number falls in past yr: 0 0 1 1 1   Injury with Fall? 0 1 0 0 0  Risk for fall due to : No Fall Risks Impaired balance/gait History of fall(s);Impaired balance/gait;Orthopedic patient History of fall(s);Impaired balance/gait;Orthopedic patient History of fall(s);Impaired balance/gait;Orthopedic patient  Follow up Falls prevention discussed Falls evaluation completed Falls evaluation completed;Falls prevention discussed Falls evaluation completed;Falls prevention discussed Education provided;Falls prevention discussed    FALL RISK PREVENTION PERTAINING TO THE HOME:  Any stairs in or around the home? No  If so, are there any without handrails? No  Home free of loose throw rugs in walkways, pet beds, electrical cords, etc? Yes  Adequate lighting in your home to reduce risk of falls? Yes   ASSISTIVE DEVICES UTILIZED TO PREVENT FALLS:  Life alert? No  Use of a cane, walker or w/c? No  Grab bars in the bathroom? Yes  Shower chair or bench in shower? No  Elevated toilet seat or a handicapped toilet? No          01/24/2023   10:37 AM 01/19/2022   10:39 AM 11/05/2019   10:22 AM  6CIT Screen  What Year? 0 points 0 points 0 points  What month? 0 points 0 points 0 points  What time? 0 points 0 points 0 points  Count back from 20 0 points 0 points 0 points  Months in reverse 0 points 4 points 4 points  Repeat phrase 0 points 4 points 2  points  Total Score 0 points 8 points 6 points    Immunizations Immunization History  Administered Date(s) Administered   Fluad Quad(high Dose 65+) 06/15/2022   Influenza,inj,Quad PF,6+ Mos 06/16/2021   Influenza-Unspecified 06/02/2020   PNEUMOCOCCAL CONJUGATE-20 09/16/2021   Pneumococcal Conjugate-13 08/10/2020   Td 08/18/1999   Tdap 12/13/2021   Zoster Recombinat (Shingrix) 03/13/2022, 10/10/2022    TDAP status: Up to date  Flu Vaccine status: Up to date  Pneumococcal vaccine status: Up to date  Covid-19 vaccine status: Completed vaccines  Qualifies for Shingles Vaccine? Yes   Zostavax completed Yes   Shingrix Completed?: Yes  Screening Tests Health Maintenance  Topic Date Due   DEXA SCAN  10/11/2023 (Originally 08/13/2022)   MAMMOGRAM  02/25/2023   INFLUENZA VACCINE  03/29/2023   Medicare Annual Wellness (AWV)  01/24/2024   Colonoscopy  02/18/2030   DTaP/Tdap/Td (3 - Td or Tdap) 12/14/2031   Pneumonia Vaccine 43+ Years old  Completed   Hepatitis C Screening  Completed   Zoster Vaccines- Shingrix  Completed   HPV VACCINES  Aged Out   COVID-19 Vaccine  Discontinued    Health Maintenance  There are no preventive care reminders to display for this patient.   Colorectal cancer screening: Type of screening: Colonoscopy. Completed 02/19/2020. Repeat every 10 years  Mammogram status: Completed 02/24/2022. Repeat every year  Bone Density status: Ordered 01/24/2023. Pt provided with contact info and advised to call to schedule appt.  Lung Cancer Screening: (Low Dose CT Chest recommended if Age 22-80 years, 30 pack-year currently smoking OR have quit w/in 15years.) does not qualify.   Lung Cancer Screening Referral: n/a  Additional Screening:  Hepatitis C Screening: does not qualify; Completed 05/07/2020  Vision Screening: Recommended annual ophthalmology exams for early detection of glaucoma and other disorders of the eye. Is the patient up to date with their  annual eye exam?  Yes  Who is the provider or what is the name of the office in which the patient attends annual eye exams? Dr.Davis  If pt is not established with a provider, would they like to be referred to a provider to establish care? No .   Dental Screening: Recommended annual dental exams for proper oral hygiene  Community Resource Referral / Chronic Care Management: CRR required this visit?  No   CCM required this visit?  No      Plan:     I have personally reviewed and noted the following in the patient's chart:   Medical and social history Use of alcohol, tobacco or illicit drugs  Current medications and supplements including opioid prescriptions. Patient is not currently taking opioid prescriptions. Functional ability and status Nutritional status Physical activity Advanced directives List of other physicians Hospitalizations, surgeries, and ER visits in previous 12 months Vitals Screenings to include cognitive, depression, and falls Referrals and appointments  In addition, I have reviewed and discussed with patient certain preventive protocols, quality metrics, and best practice recommendations. A written personalized care plan for preventive services as well as general preventive health recommendations were provided to patient.     Lorrene Reid, LPN   1/61/0960   Nurse Notes: none

## 2023-02-22 ENCOUNTER — Telehealth: Payer: Self-pay

## 2023-02-22 NOTE — Telephone Encounter (Signed)
Patient fell four days ago and hit her head.  Since then she has had a headache and not feeling well.  Patient was advised she needs to be seen at the emergency room so that a CT head can be done.  Patient voiced understanding and states she will go to ER.

## 2023-03-08 LAB — HM MAMMOGRAPHY

## 2023-03-09 ENCOUNTER — Other Ambulatory Visit: Payer: Self-pay | Admitting: Family

## 2023-03-09 DIAGNOSIS — E7800 Pure hypercholesterolemia, unspecified: Secondary | ICD-10-CM

## 2023-03-23 ENCOUNTER — Inpatient Hospital Stay: Payer: Medicare Other | Attending: Hematology

## 2023-03-23 DIAGNOSIS — N189 Chronic kidney disease, unspecified: Secondary | ICD-10-CM | POA: Insufficient documentation

## 2023-03-23 DIAGNOSIS — D509 Iron deficiency anemia, unspecified: Secondary | ICD-10-CM | POA: Diagnosis not present

## 2023-03-23 DIAGNOSIS — D649 Anemia, unspecified: Secondary | ICD-10-CM

## 2023-03-23 DIAGNOSIS — E559 Vitamin D deficiency, unspecified: Secondary | ICD-10-CM | POA: Insufficient documentation

## 2023-03-23 LAB — COMPREHENSIVE METABOLIC PANEL
ALT: 16 U/L (ref 0–44)
AST: 19 U/L (ref 15–41)
Albumin: 4.2 g/dL (ref 3.5–5.0)
Alkaline Phosphatase: 62 U/L (ref 38–126)
Anion gap: 8 (ref 5–15)
BUN: 16 mg/dL (ref 8–23)
CO2: 27 mmol/L (ref 22–32)
Calcium: 9.1 mg/dL (ref 8.9–10.3)
Chloride: 103 mmol/L (ref 98–111)
Creatinine, Ser: 1.09 mg/dL — ABNORMAL HIGH (ref 0.44–1.00)
GFR, Estimated: 55 mL/min — ABNORMAL LOW (ref >60)
Glucose, Bld: 95 mg/dL (ref 70–99)
Potassium: 4.2 mmol/L (ref 3.5–5.1)
Sodium: 138 mmol/L (ref 135–145)
Total Bilirubin: 0.6 mg/dL (ref 0.3–1.2)
Total Protein: 7.9 g/dL (ref 6.5–8.1)

## 2023-03-23 LAB — CBC WITH DIFFERENTIAL/PLATELET
Abs Immature Granulocytes: 0.01 10*3/uL (ref 0.00–0.07)
Basophils Absolute: 0 10*3/uL (ref 0.0–0.1)
Basophils Relative: 1 %
Eosinophils Absolute: 0 10*3/uL (ref 0.0–0.5)
Eosinophils Relative: 1 %
HCT: 35.4 % — ABNORMAL LOW (ref 36.0–46.0)
Hemoglobin: 11.4 g/dL — ABNORMAL LOW (ref 12.0–15.0)
Immature Granulocytes: 0 %
Lymphocytes Relative: 48 %
Lymphs Abs: 2.6 10*3/uL (ref 0.7–4.0)
MCH: 30.2 pg (ref 26.0–34.0)
MCHC: 32.2 g/dL (ref 30.0–36.0)
MCV: 93.7 fL (ref 80.0–100.0)
Monocytes Absolute: 0.4 10*3/uL (ref 0.1–1.0)
Monocytes Relative: 8 %
Neutro Abs: 2.2 10*3/uL (ref 1.7–7.7)
Neutrophils Relative %: 42 %
Platelets: 258 10*3/uL (ref 150–400)
RBC: 3.78 MIL/uL — ABNORMAL LOW (ref 3.87–5.11)
RDW: 12.6 % (ref 11.5–15.5)
WBC: 5.3 10*3/uL (ref 4.0–10.5)
nRBC: 0 % (ref 0.0–0.2)

## 2023-03-23 LAB — IRON AND TIBC
Iron: 92 ug/dL (ref 28–170)
Saturation Ratios: 31 % (ref 10.4–31.8)
TIBC: 300 ug/dL (ref 250–450)
UIBC: 208 ug/dL

## 2023-03-23 LAB — VITAMIN D 25 HYDROXY (VIT D DEFICIENCY, FRACTURES): Vit D, 25-Hydroxy: 51.58 ng/mL (ref 30–100)

## 2023-03-23 LAB — VITAMIN B12: Vitamin B-12: 350 pg/mL (ref 180–914)

## 2023-03-23 LAB — FERRITIN: Ferritin: 199 ng/mL (ref 11–307)

## 2023-03-27 ENCOUNTER — Encounter: Payer: Self-pay | Admitting: Family

## 2023-03-30 ENCOUNTER — Inpatient Hospital Stay: Payer: Medicare Other | Attending: Physician Assistant | Admitting: Oncology

## 2023-03-30 ENCOUNTER — Ambulatory Visit: Payer: Medicare Other | Admitting: Physician Assistant

## 2023-03-30 VITALS — BP 104/71 | HR 69 | Temp 98.2°F | Resp 17 | Wt 158.7 lb

## 2023-03-30 DIAGNOSIS — E559 Vitamin D deficiency, unspecified: Secondary | ICD-10-CM | POA: Insufficient documentation

## 2023-03-30 DIAGNOSIS — D631 Anemia in chronic kidney disease: Secondary | ICD-10-CM | POA: Insufficient documentation

## 2023-03-30 DIAGNOSIS — N189 Chronic kidney disease, unspecified: Secondary | ICD-10-CM | POA: Insufficient documentation

## 2023-03-30 DIAGNOSIS — D509 Iron deficiency anemia, unspecified: Secondary | ICD-10-CM | POA: Diagnosis present

## 2023-03-30 NOTE — Progress Notes (Signed)
REASON FOR VISIT:  Follow-up for normocytic anemia secondary to CKD +/- iron deficiency   CURRENT THERAPY: Oral iron supplementation  INTERVAL HISTORY:  Kristi Barnes is here for follow-up for normocytic anemia. She was last seen by Rojelio Brenner PA-C on 09/24/22.  Evaluated in [facility] ED on 02/22/23 after she fell in the bathroom d/t BLE neuropathy and arthritis hitting her head.  Feels like she is slowly recovering.  Occasionally will have a headache and jaw pain from the accident.  Feels like her strength has improved.  She continues to take an iron supplement each day.  Denies any constipation or GI upset.  She has also been taking vitamin D once a week on Sunday mornings. She has not noticed any bright red blood per rectum, melena, epistaxis, or hematemesis.  She reports 75% energy and 100% appetite, and reports that she is maintaining a stable weight at this time.  REVIEW OF SYSTEMS:   Review of Systems  Constitutional:  Positive for malaise/fatigue.  Respiratory:  Positive for shortness of breath.   Gastrointestinal:  Positive for constipation and diarrhea.  Neurological:  Positive for headaches.  Psychiatric/Behavioral:  The patient has insomnia.      PHYSICAL EXAM: (per limitations of virtual telephone visit)  Physical Exam Constitutional:      Appearance: Normal appearance.  Cardiovascular:     Rate and Rhythm: Normal rate and regular rhythm.  Pulmonary:     Effort: Pulmonary effort is normal.     Breath sounds: Normal breath sounds.  Abdominal:     General: Bowel sounds are normal.     Palpations: Abdomen is soft.  Musculoskeletal:        General: No swelling. Normal range of motion.  Neurological:     Mental Status: She is alert and oriented to person, place, and time. Mental status is at baseline.     ASSESSMENT & PLAN:  1.  Normocytic anemia +/- iron deficiency - Chronic problem since birth, per patient report - Hgb intermittently low ranging from 11.0 to  normal with normal MCV - Colonoscopy (01/30/2020) showed no worrisome findings - SPEP negative.  CRP and ESR negative as well. - She is taking ferrous sulfate 325 mg daily - She has never required IV iron. - No signs of bleeding such as bright red blood per rectum or melena   - She has some leg cramps and occasional headaches.  Otherwise asymptomatic.   - Most recent labs from 03/23/2023 show hemoglobin of 11.4 iron saturation 31%, ferritin 199, MMA 152 and B12 level of 350.  Vitamin D level is within normal limits.  CMP is unremarkable. -Recommend she continue iron tablets 325 mg daily.  2.  Vitamin B12 deficiency, RESOLVED - No evidence of B12 deficiency.  Most recent B12 level was 350.  MMA is 152.   3.  Vitamin D deficiency - Vitamin D level from 03/23/2023 is 51.58. -Continue 50,000 units weekly.   4.  Other history - Non-smoker. - No personal history of cancer - Family history of several passing away from lung cancer.  Patient's sister had leukemia at age 25  5. CKD: -Has had an intermittent creatinine since 2021.  -Referral to Dr. Wolfgang Phoenix kidney specialist.  -Possible Retacrit in the future should anemia continue or worsen < 11.  PLAN SUMMARY: >> Discussed follow-up with primary care doctor given she has been stable over the past few lab draws.  Recommend she continue iron supplements and vitamin D weekly.  I discussed the assessment and treatment plan with the patient. The patient was provided an opportunity to ask questions and all were answered. The patient agreed with the plan and demonstrated an understanding of the instructions.   The patient was advised to call back or seek an in-person evaluation if the symptoms worsen or if the condition fails to improve as anticipated.  I spent 20 minutes dedicated to the care of this patient (face-to-face and non-face-to-face) on the date of the encounter to include what is described in the assessment and plan.  Durenda Hurt,  NP 03/30/2023 11:13 PM

## 2023-04-12 ENCOUNTER — Ambulatory Visit (INDEPENDENT_AMBULATORY_CARE_PROVIDER_SITE_OTHER): Payer: Medicare Other

## 2023-04-12 ENCOUNTER — Ambulatory Visit (INDEPENDENT_AMBULATORY_CARE_PROVIDER_SITE_OTHER): Payer: Medicare Other | Admitting: Family

## 2023-04-12 ENCOUNTER — Encounter: Payer: Self-pay | Admitting: Family

## 2023-04-12 VITALS — BP 130/68 | HR 65 | Temp 97.6°F | Ht 62.0 in | Wt 159.0 lb

## 2023-04-12 DIAGNOSIS — I10 Essential (primary) hypertension: Secondary | ICD-10-CM | POA: Diagnosis not present

## 2023-04-12 DIAGNOSIS — R232 Flushing: Secondary | ICD-10-CM | POA: Diagnosis not present

## 2023-04-12 DIAGNOSIS — K5900 Constipation, unspecified: Secondary | ICD-10-CM

## 2023-04-12 DIAGNOSIS — Z79899 Other long term (current) drug therapy: Secondary | ICD-10-CM | POA: Diagnosis not present

## 2023-04-12 DIAGNOSIS — G8929 Other chronic pain: Secondary | ICD-10-CM

## 2023-04-12 DIAGNOSIS — K219 Gastro-esophageal reflux disease without esophagitis: Secondary | ICD-10-CM

## 2023-04-12 DIAGNOSIS — M170 Bilateral primary osteoarthritis of knee: Secondary | ICD-10-CM | POA: Diagnosis not present

## 2023-04-12 DIAGNOSIS — E7800 Pure hypercholesterolemia, unspecified: Secondary | ICD-10-CM

## 2023-04-12 DIAGNOSIS — Z780 Asymptomatic menopausal state: Secondary | ICD-10-CM | POA: Diagnosis not present

## 2023-04-12 DIAGNOSIS — M545 Low back pain, unspecified: Secondary | ICD-10-CM

## 2023-04-12 DIAGNOSIS — E559 Vitamin D deficiency, unspecified: Secondary | ICD-10-CM

## 2023-04-12 MED ORDER — AMLODIPINE BESYLATE 10 MG PO TABS
10.0000 mg | ORAL_TABLET | Freq: Every day | ORAL | 0 refills | Status: DC
Start: 2023-04-12 — End: 2023-07-09

## 2023-04-12 MED ORDER — ESCITALOPRAM OXALATE 10 MG PO TABS
10.0000 mg | ORAL_TABLET | Freq: Every day | ORAL | 3 refills | Status: DC
Start: 2023-04-12 — End: 2023-07-12

## 2023-04-12 MED ORDER — TRAMADOL HCL 50 MG PO TABS
50.0000 mg | ORAL_TABLET | Freq: Four times a day (QID) | ORAL | 2 refills | Status: DC | PRN
Start: 2023-04-12 — End: 2023-07-12

## 2023-04-12 NOTE — Progress Notes (Signed)
Subjective:    Patient ID: Kristi Barnes, female    DOB: 1951-08-25, 72 y.o.   MRN: [id number]  Chief Complaint  Patient presents with   Medical Management of Chronic Issues   PT presents to the office today for chronic follow up. She is followed by Hematologists as needed  iron deficiency anemia.     Complaining of hot flashes and irritability.  Hypertension This is a chronic problem. The current episode started more than 1 year ago. The problem has been resolved since onset. The problem is controlled. Associated symptoms include peripheral edema. Pertinent negatives include no malaise/fatigue or shortness of breath. Risk factors for coronary artery disease include dyslipidemia, obesity and sedentary lifestyle. The current treatment provides moderate improvement.  Gastroesophageal Reflux She complains of belching and heartburn. This is a chronic problem. The current episode started more than 1 year ago. The problem occurs occasionally. Risk factors include obesity. She has tried a PPI for the symptoms. The treatment provided moderate relief.  Arthritis Presents for follow-up visit. She complains of pain and stiffness. The symptoms have been stable. Affected locations include the right knee, left knee, left MCP and right MCP. Her pain is at a severity of 7/10.  Hyperlipidemia This is a chronic problem. The current episode started more than 1 year ago. The problem is controlled. Recent lipid tests were reviewed and are normal. Exacerbating diseases include obesity. Pertinent negatives include no shortness of breath. Current antihyperlipidemic treatment includes statins. The current treatment provides moderate improvement of lipids. Risk factors for coronary artery disease include dyslipidemia, hypertension, a sedentary lifestyle and post-menopausal.  Anemia Presents for follow-up visit. There has been no malaise/fatigue.  Constipation    Current opioids rx- Ultram 50 mg  # meds rx- 120   Effectiveness of current meds-stable Adverse reactions from pain meds-None Morphine equivalent- 8   Pill count performed-No Last drug screen - 06/15/22 ( high risk q70m, moderate risk q7m, low risk yearly ) Urine drug screen today- No Was the NCCSR reviewed- Yes             If yes were their any concerning findings? - none   Pain contract signed on: 06/15/22  Review of Systems  Constitutional:  Negative for malaise/fatigue.  Respiratory:  Negative for shortness of breath.   Gastrointestinal:  Positive for constipation and heartburn.  Musculoskeletal:  Positive for arthritis and stiffness.  All other systems reviewed and are negative.      Objective:   Physical Exam Vitals reviewed.  Constitutional:      General: She is not in acute distress.    Appearance: She is well-developed. She is obese.  HENT:     Head: Normocephalic and atraumatic.     Right Ear: Tympanic membrane normal.     Left Ear: Tympanic membrane normal.  Eyes:     Pupils: Pupils are equal, round, and reactive to light.  Neck:     Thyroid: No thyromegaly.  Cardiovascular:     Rate and Rhythm: Normal rate and regular rhythm.     Heart sounds: Normal heart sounds. No murmur heard. Pulmonary:     Effort: Pulmonary effort is normal. No respiratory distress.     Breath sounds: Normal breath sounds. No wheezing.  Abdominal:     General: Bowel sounds are normal. There is no distension.     Palpations: Abdomen is soft.     Tenderness: There is no abdominal tenderness.  Musculoskeletal:        General:  No tenderness. Normal range of motion.     Cervical back: Normal range of motion and neck supple.  Skin:    General: Skin is warm and dry.  Neurological:     Mental Status: She is alert and oriented to person, place, and time.     Cranial Nerves: No cranial nerve deficit.     Deep Tendon Reflexes: Reflexes are normal and symmetric.  Psychiatric:        Behavior: Behavior normal.        Thought Content:  Thought content normal.        Judgment: Judgment normal.       BP 130/68   Pulse 65   Temp 97.6 F (36.4 C) (Temporal)   Ht 5\' 2"  (1.575 m)   Wt 159 lb (72.1 kg)   SpO2 97%   BMI 29.08 kg/m      Assessment & Plan:  Kristi Barnes [name]   Diagnosis and orders addressed:  1. Primary hypertension - amLODipine (NORVASC) 10 MG tablet; Take 1 tablet (10 mg total) by mouth daily.  Dispense: 90 tablet; Refill: 0  2. Hot flashes - escitalopram (LEXAPRO) 10 MG tablet; Take 1 tablet (10 mg total) by mouth daily.  Dispense: 90 tablet; Refill: 3  3. Primary osteoarthritis of both knees - traMADol (ULTRAM) 50 MG tablet; Take 1-2 tablets (50-100 mg total) by mouth every 6 (six) hours as needed. for pain  Dispense: 120 tablet; Refill: 2  4. Controlled substance agreement signed  - traMADol (ULTRAM) 50 MG tablet; Take 1-2 tablets (50-100 mg total) by mouth every 6 (six) hours as needed. for pain  Dispense: 120 tablet; Refill: 2  5. Chronic midline low back pain without sciatica - traMADol (ULTRAM) 50 MG tablet; Take 1-2 tablets (50-100 mg total) by mouth every 6 (six) hours as needed. for pain  Dispense: 120 tablet; Refill: 2  6. Constipation, unspecified constipation type  7. Gastroesophageal reflux disease without esophagitis  8. Pure hypercholesterolemia  9. Vitamin D deficiency   Labs reviewed  Continue current medications  Patient reviewed in [place] controlled database, no flags noted. Contract and drug screen are up to date.  Health Maintenance reviewed Diet and exercise encouraged  Follow up plan: 3 months    Kristi Rodney, FNP

## 2023-04-12 NOTE — Patient Instructions (Signed)
Health Maintenance After Age 70 After age 70, you are at a higher risk for certain long-term diseases and infections as well as injuries from falls. Falls are a major cause of broken bones and head injuries in people who are older than age 70. Getting regular preventive care can help to keep you healthy and well. Preventive care includes getting regular testing and making lifestyle changes as recommended by your health care provider. Talk with your health care provider about: Which screenings and tests you should have. A screening is a test that checks for a disease when you have no symptoms. A diet and exercise plan that is right for you. What should I know about screenings and tests to prevent falls? Screening and testing are the best ways to find a health problem early. Early diagnosis and treatment give you the best chance of managing medical conditions that are common after age 70. Certain conditions and lifestyle choices may make you more likely to have a fall. Your health care provider may recommend: Regular vision checks. Poor vision and conditions such as cataracts can make you more likely to have a fall. If you wear glasses, make sure to get your prescription updated if your vision changes. Medicine review. Work with your health care provider to regularly review all of the medicines you are taking, including over-the-counter medicines. Ask your health care provider about any side effects that may make you more likely to have a fall. Tell your health care provider if any medicines that you take make you feel dizzy or sleepy. Strength and balance checks. Your health care provider may recommend certain tests to check your strength and balance while standing, walking, or changing positions. Foot health exam. Foot pain and numbness, as well as not wearing proper footwear, can make you more likely to have a fall. Screenings, including: Osteoporosis screening. Osteoporosis is a condition that causes  the bones to get weaker and break more easily. Blood pressure screening. Blood pressure changes and medicines to control blood pressure can make you feel dizzy. Depression screening. You may be more likely to have a fall if you have a fear of falling, feel depressed, or feel unable to do activities that you used to do. Alcohol use screening. Using too much alcohol can affect your balance and may make you more likely to have a fall. Follow these instructions at home: Lifestyle Do not drink alcohol if: Your health care provider tells you not to drink. If you drink alcohol: Limit how much you have to: 0-1 drink a day for women. 0-2 drinks a day for men. Know how much alcohol is in your drink. In the [place] one drink equals one 12 oz bottle of beer (355 mL), one 5 oz glass of wine (148 mL), or one 1 oz glass of hard liquor (44 mL). Do not use any products that contain nicotine or tobacco. These products include cigarettes, chewing tobacco, and vaping devices, such as e-cigarettes. If you need help quitting, ask your health care provider. Activity  Follow a regular exercise program to stay fit. This will help you maintain your balance. Ask your health care provider what types of exercise are appropriate for you. If you need a cane or walker, use it as recommended by your health care provider. Wear supportive shoes that have nonskid soles. Safety  Remove any tripping hazards, such as rugs, cords, and clutter. Install safety equipment such as grab bars in bathrooms and safety rails on stairs. Keep rooms and walkways   well-lit. General instructions Talk with your health care provider about your risks for falling. Tell your health care provider if: You fall. Be sure to tell your health care provider about all falls, even ones that seem minor. You feel dizzy, tiredness (fatigue), or off-balance. Take over-the-counter and prescription medicines only as told by your health care provider. These include  supplements. Eat a healthy diet and maintain a healthy weight. A healthy diet includes low-fat dairy products, low-fat (lean) meats, and fiber from whole grains, beans, and lots of fruits and vegetables. Stay current with your vaccines. Schedule regular health, dental, and eye exams. Summary Having a healthy lifestyle and getting preventive care can help to protect your health and wellness after age 70. Screening and testing are the best way to find a health problem early and help you avoid having a fall. Early diagnosis and treatment give you the best chance for managing medical conditions that are more common for people who are older than age 70. Falls are a major cause of broken bones and head injuries in people who are older than age 70. Take precautions to prevent a fall at home. Work with your health care provider to learn what changes you can make to improve your health and wellness and to prevent falls. This information is not intended to replace advice given to you by your health care provider. Make sure you discuss any questions you have with your health care provider. Document Revised: 01/03/2021 Document Reviewed: 01/03/2021 Elsevier Patient Education  2024 Elsevier Inc.  

## 2023-06-05 ENCOUNTER — Other Ambulatory Visit: Payer: Self-pay | Admitting: Family

## 2023-06-05 DIAGNOSIS — E7800 Pure hypercholesterolemia, unspecified: Secondary | ICD-10-CM

## 2023-07-08 ENCOUNTER — Other Ambulatory Visit: Payer: Self-pay | Admitting: Family

## 2023-07-08 DIAGNOSIS — I10 Essential (primary) hypertension: Secondary | ICD-10-CM

## 2023-07-12 ENCOUNTER — Ambulatory Visit (INDEPENDENT_AMBULATORY_CARE_PROVIDER_SITE_OTHER): Payer: Medicare HMO | Admitting: Family

## 2023-07-12 ENCOUNTER — Encounter: Payer: Self-pay | Admitting: Family

## 2023-07-12 VITALS — BP 136/79 | HR 65 | Temp 97.7°F | Ht 62.0 in | Wt 159.2 lb

## 2023-07-12 DIAGNOSIS — I10 Essential (primary) hypertension: Secondary | ICD-10-CM | POA: Diagnosis not present

## 2023-07-12 DIAGNOSIS — M545 Low back pain, unspecified: Secondary | ICD-10-CM

## 2023-07-12 DIAGNOSIS — Z23 Encounter for immunization: Secondary | ICD-10-CM | POA: Diagnosis not present

## 2023-07-12 DIAGNOSIS — Z79899 Other long term (current) drug therapy: Secondary | ICD-10-CM | POA: Diagnosis not present

## 2023-07-12 DIAGNOSIS — D539 Nutritional anemia, unspecified: Secondary | ICD-10-CM

## 2023-07-12 DIAGNOSIS — M170 Bilateral primary osteoarthritis of knee: Secondary | ICD-10-CM

## 2023-07-12 DIAGNOSIS — Z0001 Encounter for general adult medical examination with abnormal findings: Secondary | ICD-10-CM | POA: Diagnosis not present

## 2023-07-12 DIAGNOSIS — K5900 Constipation, unspecified: Secondary | ICD-10-CM

## 2023-07-12 DIAGNOSIS — G8929 Other chronic pain: Secondary | ICD-10-CM

## 2023-07-12 DIAGNOSIS — E559 Vitamin D deficiency, unspecified: Secondary | ICD-10-CM

## 2023-07-12 DIAGNOSIS — Z0000 Encounter for general adult medical examination without abnormal findings: Secondary | ICD-10-CM | POA: Diagnosis not present

## 2023-07-12 DIAGNOSIS — K219 Gastro-esophageal reflux disease without esophagitis: Secondary | ICD-10-CM

## 2023-07-12 DIAGNOSIS — R232 Flushing: Secondary | ICD-10-CM | POA: Diagnosis not present

## 2023-07-12 DIAGNOSIS — E7800 Pure hypercholesterolemia, unspecified: Secondary | ICD-10-CM | POA: Diagnosis not present

## 2023-07-12 MED ORDER — TRAMADOL HCL 50 MG PO TABS: 50.0000 mg | ORAL_TABLET | Freq: Four times a day (QID) | ORAL | 2 refills | Status: DC | PRN

## 2023-07-12 MED ORDER — ESCITALOPRAM OXALATE 10 MG PO TABS
10.0000 mg | ORAL_TABLET | Freq: Every day | ORAL | 3 refills | Status: DC
Start: 2023-07-12 — End: 2024-06-10

## 2023-07-12 MED ORDER — AMLODIPINE BESYLATE 10 MG PO TABS
10.0000 mg | ORAL_TABLET | Freq: Every day | ORAL | 2 refills | Status: DC
Start: 2023-07-12 — End: 2023-10-15

## 2023-07-12 NOTE — Patient Instructions (Signed)
Health Maintenance After Age 70 After age 70, you are at a higher risk for certain long-term diseases and infections as well as injuries from falls. Falls are a major cause of broken bones and head injuries in people who are older than age 70. Getting regular preventive care can help to keep you healthy and well. Preventive care includes getting regular testing and making lifestyle changes as recommended by your health care provider. Talk with your health care provider about: Which screenings and tests you should have. A screening is a test that checks for a disease when you have no symptoms. A diet and exercise plan that is right for you. What should I know about screenings and tests to prevent falls? Screening and testing are the best ways to find a health problem early. Early diagnosis and treatment give you the best chance of managing medical conditions that are common after age 70. Certain conditions and lifestyle choices may make you more likely to have a fall. Your health care provider may recommend: Regular vision checks. Poor vision and conditions such as cataracts can make you more likely to have a fall. If you wear glasses, make sure to get your prescription updated if your vision changes. Medicine review. Work with your health care provider to regularly review all of the medicines you are taking, including over-the-counter medicines. Ask your health care provider about any side effects that may make you more likely to have a fall. Tell your health care provider if any medicines that you take make you feel dizzy or sleepy. Strength and balance checks. Your health care provider may recommend certain tests to check your strength and balance while standing, walking, or changing positions. Foot health exam. Foot pain and numbness, as well as not wearing proper footwear, can make you more likely to have a fall. Screenings, including: Osteoporosis screening. Osteoporosis is a condition that causes  the bones to get weaker and break more easily. Blood pressure screening. Blood pressure changes and medicines to control blood pressure can make you feel dizzy. Depression screening. You may be more likely to have a fall if you have a fear of falling, feel depressed, or feel unable to do activities that you used to do. Alcohol use screening. Using too much alcohol can affect your balance and may make you more likely to have a fall. Follow these instructions at home: Lifestyle Do not drink alcohol if: Your health care provider tells you not to drink. If you drink alcohol: Limit how much you have to: 0-1 drink a day for women. 0-2 drinks a day for men. Know how much alcohol is in your drink. In the [place] one drink equals one 12 oz bottle of beer (355 mL), one 5 oz glass of wine (148 mL), or one 1 oz glass of hard liquor (44 mL). Do not use any products that contain nicotine or tobacco. These products include cigarettes, chewing tobacco, and vaping devices, such as e-cigarettes. If you need help quitting, ask your health care provider. Activity  Follow a regular exercise program to stay fit. This will help you maintain your balance. Ask your health care provider what types of exercise are appropriate for you. If you need a cane or walker, use it as recommended by your health care provider. Wear supportive shoes that have nonskid soles. Safety  Remove any tripping hazards, such as rugs, cords, and clutter. Install safety equipment such as grab bars in bathrooms and safety rails on stairs. Keep rooms and walkways   well-lit. General instructions Talk with your health care provider about your risks for falling. Tell your health care provider if: You fall. Be sure to tell your health care provider about all falls, even ones that seem minor. You feel dizzy, tiredness (fatigue), or off-balance. Take over-the-counter and prescription medicines only as told by your health care provider. These include  supplements. Eat a healthy diet and maintain a healthy weight. A healthy diet includes low-fat dairy products, low-fat (lean) meats, and fiber from whole grains, beans, and lots of fruits and vegetables. Stay current with your vaccines. Schedule regular health, dental, and eye exams. Summary Having a healthy lifestyle and getting preventive care can help to protect your health and wellness after age 70. Screening and testing are the best way to find a health problem early and help you avoid having a fall. Early diagnosis and treatment give you the best chance for managing medical conditions that are more common for people who are older than age 70. Falls are a major cause of broken bones and head injuries in people who are older than age 70. Take precautions to prevent a fall at home. Work with your health care provider to learn what changes you can make to improve your health and wellness and to prevent falls. This information is not intended to replace advice given to you by your health care provider. Make sure you discuss any questions you have with your health care provider. Document Revised: 01/03/2021 Document Reviewed: 01/03/2021 Elsevier Patient Education  2024 Elsevier Inc.  

## 2023-07-12 NOTE — Progress Notes (Signed)
Subjective:    Patient ID: Kristi Barnes, female    DOB: 1951-05-16, 72 y.o.   MRN: [id number]  Chief Complaint  Patient presents with   Medical Management of Chronic Issues   PT presents to the office today for CPE and chronic follow up. She is followed by Hematologists as needed  iron deficiency anemia.     Complaining of hot flashes and irritability.  Hypertension This is a chronic problem. The current episode started more than 1 year ago. The problem has been resolved since onset. The problem is controlled. Associated symptoms include peripheral edema. Pertinent negatives include no malaise/fatigue or shortness of breath. Risk factors for coronary artery disease include dyslipidemia, obesity and sedentary lifestyle. The current treatment provides moderate improvement.  Gastroesophageal Reflux She complains of belching, heartburn and a hoarse voice. This is a chronic problem. The current episode started more than 1 year ago. The problem occurs occasionally. She has tried a PPI for the symptoms. The treatment provided moderate relief.  Arthritis Presents for follow-up visit. She complains of pain and stiffness. Affected locations include the left knee, right knee, left MCP and right MCP. Her pain is at a severity of 7/10.  Hyperlipidemia This is a chronic problem. The current episode started more than 1 year ago. Exacerbating diseases include obesity. Pertinent negatives include no shortness of breath. Current antihyperlipidemic treatment includes statins. The current treatment provides moderate improvement of lipids. Risk factors for coronary artery disease include dyslipidemia, hypertension, a sedentary lifestyle and post-menopausal.  Constipation This is a chronic problem. The current episode started more than 1 year ago. The problem has been resolved since onset. She has tried stool softeners for the symptoms. The treatment provided moderate relief.     Current opioids rx- Ultram 50  mg  # meds rx- 120  Effectiveness of current meds-stable Adverse reactions from pain meds-None Morphine equivalent- 8   Pill count performed-No Last drug screen - 06/15/22 ( high risk q67m, moderate risk q96m, low risk yearly ) Urine drug screen today- No Was the NCCSR reviewed- Yes             If yes were their any concerning findings? - none   Pain contract signed on: 06/15/22  Review of Systems  Constitutional:  Negative for malaise/fatigue.  HENT:  Positive for hoarse voice.   Respiratory:  Negative for shortness of breath.   Gastrointestinal:  Positive for constipation and heartburn.  Musculoskeletal:  Positive for arthritis and stiffness.  All other systems reviewed and are negative.  Family History  Problem Relation Age of Onset   Heart attack Mother    Cancer Father        lung   Leukemia Sister    Cancer Brother        back of neck   Cancer Sister        back of neck   Colon cancer Neg Hx    Social History   Socioeconomic History   Marital status: Divorced    Spouse name: Not on file   Number of children: 2   Years of education: 12   Highest education level: High school graduate  Occupational History   Occupation: retired  Tobacco Use   Smoking status: Never   Smokeless tobacco: Never  Vaping Use   Vaping status: Never Used  Substance and Sexual Activity   Alcohol use: No   Drug use: No   Sexual activity: Not Currently  Other Topics Concern   Not  on file  Social History Narrative   Lives alone. Twin sister lives next door. Lots of family nearby   Social Determinants of Health   Financial Resource Strain: Low Risk  (01/24/2023)   Overall Financial Resource Strain (CARDIA)    Difficulty of Paying Living Expenses: Not hard at all  Food Insecurity: No Food Insecurity (01/24/2023)   Hunger Vital Sign    Worried About Running Out of Food in the Last Year: Never true    Ran Out of Food in the Last Year: Never true  Transportation Needs: No  Transportation Needs (01/24/2023)   PRAPARE - Administrator, Civil Service (Medical): No    Lack of Transportation (Non-Medical): No  Physical Activity: Sufficiently Active (01/24/2023)   Exercise Vital Sign    Days of Exercise per Week: 5 days    Minutes of Exercise per Session: 60 min  Stress: No Stress Concern Present (01/24/2023)   [employer] of Occupational Health - Occupational Stress Questionnaire    Feeling of Stress : Not at all  Social Connections: Moderately Isolated (01/24/2023)   Social Connection and Isolation Panel [NHANES]    Frequency of Communication with Friends and Family: More than three times a week    Frequency of Social Gatherings with Friends and Family: More than three times a week    Attends Religious Services: More than 4 times per year    Active Member of [employer] or Organizations: No    Attends Banker Meetings: Never    Marital Status: Divorced       Objective:   Physical Exam Vitals reviewed.  Constitutional:      General: She is not in acute distress.    Appearance: She is well-developed.  HENT:     Head: Normocephalic and atraumatic.     Right Ear: Tympanic membrane normal.     Left Ear: Tympanic membrane normal.  Eyes:     Pupils: Pupils are equal, round, and reactive to light.  Neck:     Thyroid: No thyromegaly.  Cardiovascular:     Rate and Rhythm: Normal rate and regular rhythm.     Heart sounds: Normal heart sounds. No murmur heard. Pulmonary:     Effort: Pulmonary effort is normal. No respiratory distress.     Breath sounds: Normal breath sounds. No wheezing.  Abdominal:     General: Bowel sounds are normal. There is no distension.     Palpations: Abdomen is soft.     Tenderness: There is no abdominal tenderness.  Musculoskeletal:        General: No tenderness. Normal range of motion.     Cervical back: Normal range of motion and neck supple.     Comments: Full ROM of lumbar  Skin:    General: Skin is  warm and dry.  Neurological:     Mental Status: She is alert and oriented to person, place, and time.     Cranial Nerves: No cranial nerve deficit.     Deep Tendon Reflexes: Reflexes are normal and symmetric.  Psychiatric:        Behavior: Behavior normal.        Thought Content: Thought content normal.        Judgment: Judgment normal.     BP 136/79   Pulse 65   Temp 97.7 F (36.5 C)   Ht 5\' 2"  (1.575 m)   Wt 159 lb 3.2 oz (72.2 kg)   SpO2 96%   BMI 29.12 kg/m  Assessment & Plan:  Kristi Barnes [name]   Diagnosis and orders addressed:  1. Annual physical exam - CBC with Differential/Platelet - CMP14+EGFR - Lipid panel - VITAMIN D 25 Hydroxy (Vit-D Deficiency, Fractures)  2. Primary hypertension - amLODipine (NORVASC) 10 MG tablet; Take 1 tablet (10 mg total) by mouth daily.  Dispense: 90 tablet; Refill: 2 - CBC with Differential/Platelet - CMP14+EGFR  3. Hot flashes - escitalopram (LEXAPRO) 10 MG tablet; Take 1 tablet (10 mg total) by mouth daily.  Dispense: 90 tablet; Refill: 3 - CBC with Differential/Platelet - CMP14+EGFR  4. Deficiency anemia - CBC with Differential/Platelet - CMP14+EGFR  5. Constipation, unspecified constipation type - CBC with Differential/Platelet - CMP14+EGFR  6. Controlled substance agreement signed - CBC with Differential/Platelet - CMP14+EGFR - traMADol (ULTRAM) 50 MG tablet; Take 1-2 tablets (50-100 mg total) by mouth every 6 (six) hours as needed. for pain  Dispense: 120 tablet; Refill: 2 - Drug Screen 10 W/Conf, Serum - ToxASSURE Select 13 (MW), Urine  7. Vitamin D deficiency - CBC with Differential/Platelet - CMP14+EGFR - VITAMIN D 25 Hydroxy (Vit-D Deficiency, Fractures)  8. Primary osteoarthritis of both knees - CBC with Differential/Platelet - CMP14+EGFR - traMADol (ULTRAM) 50 MG tablet; Take 1-2 tablets (50-100 mg total) by mouth every 6  (six) hours as needed. for pain  Dispense: 120 tablet; Refill: 2 - Drug Screen 10 W/Conf, Serum  9. Pure hypercholesterolemia - CBC with Differential/Platelet - CMP14+EGFR - Lipid panel  10. Gastroesophageal reflux disease without esophagitis - CBC with Differential/Platelet - CMP14+EGFR  11. Chronic midline low back pain without sciatica - traMADol (ULTRAM) 50 MG tablet; Take 1-2 tablets (50-100 mg total) by mouth every 6 (six) hours as needed. for pain  Dispense: 120 tablet; Refill: 2  12. Encounter for immunization - Flu Vaccine Trivalent High Dose (Fluad)   Labs pending Patient reviewed in [place] controlled database, no flags noted. Contract and drug screen are up to date.  Continue current medications  Health Maintenance reviewed Diet and exercise encouraged  Follow up plan: 3 months    Jannifer Rodney, FNP

## 2023-07-13 ENCOUNTER — Ambulatory Visit: Payer: Medicare Other | Admitting: Family

## 2023-07-13 LAB — CBC WITH DIFFERENTIAL/PLATELET
Basophils Absolute: 0 10*3/uL (ref 0.0–0.2)
Basos: 0 %
EOS (ABSOLUTE): 0 10*3/uL (ref 0.0–0.4)
Eos: 1 %
Hematocrit: 34.9 % (ref 34.0–46.6)
Hemoglobin: 11.3 g/dL (ref 11.1–15.9)
Immature Grans (Abs): 0 10*3/uL (ref 0.0–0.1)
Immature Granulocytes: 0 %
Lymphocytes Absolute: 3.2 10*3/uL — ABNORMAL HIGH (ref 0.7–3.1)
Lymphs: 51 %
MCH: 30.5 pg (ref 26.6–33.0)
MCHC: 32.4 g/dL (ref 31.5–35.7)
MCV: 94 fL (ref 79–97)
Monocytes Absolute: 0.5 10*3/uL (ref 0.1–0.9)
Monocytes: 8 %
Neutrophils Absolute: 2.6 10*3/uL (ref 1.4–7.0)
Neutrophils: 40 %
Platelets: 257 10*3/uL (ref 150–450)
RBC: 3.7 x10E6/uL — ABNORMAL LOW (ref 3.77–5.28)
RDW: 12.1 % (ref 11.7–15.4)
WBC: 6.5 10*3/uL (ref 3.4–10.8)

## 2023-07-13 LAB — CMP14+EGFR
ALT: 12 [IU]/L (ref 0–32)
AST: 17 [IU]/L (ref 0–40)
Albumin: 4.5 g/dL (ref 3.9–4.9)
Alkaline Phosphatase: 76 [IU]/L (ref 44–121)
BUN/Creatinine Ratio: 12 (ref 12–28)
BUN: 13 mg/dL (ref 8–27)
Bilirubin Total: 0.2 mg/dL (ref 0.0–1.2)
CO2: 24 mmol/L (ref 20–29)
Calcium: 9.4 mg/dL (ref 8.7–10.3)
Chloride: 103 mmol/L (ref 96–106)
Creatinine, Ser: 1.05 mg/dL — ABNORMAL HIGH (ref 0.57–1.00)
Globulin, Total: 2.8 g/dL (ref 1.5–4.5)
Glucose: 75 mg/dL (ref 70–99)
Potassium: 4.4 mmol/L (ref 3.5–5.2)
Sodium: 142 mmol/L (ref 134–144)
Total Protein: 7.3 g/dL (ref 6.0–8.5)
eGFR: 57 mL/min/{1.73_m2} — ABNORMAL LOW (ref >59)

## 2023-07-13 LAB — LIPID PANEL
Chol/HDL Ratio: 2.4 ratio (ref 0.0–4.4)
Cholesterol, Total: 160 mg/dL (ref 100–199)
HDL: 66 mg/dL (ref >39)
LDL Chol Calc (NIH): 81 mg/dL (ref 0–99)
Triglycerides: 66 mg/dL (ref 0–149)
VLDL Cholesterol Cal: 13 mg/dL (ref 5–40)

## 2023-07-13 LAB — VITAMIN D 25 HYDROXY (VIT D DEFICIENCY, FRACTURES): Vit D, 25-Hydroxy: 51.8 ng/mL (ref 30.0–100.0)

## 2023-07-17 LAB — TOXASSURE SELECT 13 (MW), URINE

## 2023-08-14 DIAGNOSIS — H401121 Primary open-angle glaucoma, left eye, mild stage: Secondary | ICD-10-CM | POA: Diagnosis not present

## 2023-08-14 DIAGNOSIS — H524 Presbyopia: Secondary | ICD-10-CM | POA: Diagnosis not present

## 2023-09-01 ENCOUNTER — Other Ambulatory Visit: Payer: Self-pay | Admitting: Family

## 2023-09-01 DIAGNOSIS — E7800 Pure hypercholesterolemia, unspecified: Secondary | ICD-10-CM

## 2023-09-27 DIAGNOSIS — H26492 Other secondary cataract, left eye: Secondary | ICD-10-CM | POA: Diagnosis not present

## 2023-10-13 ENCOUNTER — Other Ambulatory Visit: Payer: Self-pay | Admitting: Family

## 2023-10-13 DIAGNOSIS — I10 Essential (primary) hypertension: Secondary | ICD-10-CM

## 2023-10-25 DIAGNOSIS — H26491 Other secondary cataract, right eye: Secondary | ICD-10-CM | POA: Diagnosis not present

## 2023-11-23 ENCOUNTER — Other Ambulatory Visit: Payer: Self-pay | Admitting: Family

## 2023-11-23 DIAGNOSIS — E7800 Pure hypercholesterolemia, unspecified: Secondary | ICD-10-CM

## 2023-11-23 MED ORDER — SIMVASTATIN 40 MG PO TABS: 40.0000 mg | ORAL_TABLET | Freq: Every day | ORAL | 0 refills | Status: DC

## 2023-11-23 NOTE — Addendum Note (Signed)
 Addended by: Julious Payer D on: 11/23/2023 12:07 PM   Modules accepted: Orders

## 2023-11-23 NOTE — Telephone Encounter (Signed)
 Christy pt NTBS 30-d given 11/23/23

## 2023-11-23 NOTE — Telephone Encounter (Signed)
 Appt 12-07-2023

## 2023-12-07 ENCOUNTER — Encounter: Payer: Self-pay | Admitting: Family

## 2023-12-07 ENCOUNTER — Ambulatory Visit (INDEPENDENT_AMBULATORY_CARE_PROVIDER_SITE_OTHER): Admitting: Family

## 2023-12-07 VITALS — BP 127/65 | HR 66 | Temp 97.9°F | Ht 62.0 in | Wt 160.0 lb

## 2023-12-07 DIAGNOSIS — Z79899 Other long term (current) drug therapy: Secondary | ICD-10-CM | POA: Diagnosis not present

## 2023-12-07 DIAGNOSIS — I10 Essential (primary) hypertension: Secondary | ICD-10-CM | POA: Diagnosis not present

## 2023-12-07 DIAGNOSIS — G8929 Other chronic pain: Secondary | ICD-10-CM

## 2023-12-07 DIAGNOSIS — E559 Vitamin D deficiency, unspecified: Secondary | ICD-10-CM

## 2023-12-07 DIAGNOSIS — M170 Bilateral primary osteoarthritis of knee: Secondary | ICD-10-CM | POA: Diagnosis not present

## 2023-12-07 DIAGNOSIS — K5900 Constipation, unspecified: Secondary | ICD-10-CM | POA: Diagnosis not present

## 2023-12-07 DIAGNOSIS — D539 Nutritional anemia, unspecified: Secondary | ICD-10-CM

## 2023-12-07 DIAGNOSIS — M545 Low back pain, unspecified: Secondary | ICD-10-CM | POA: Diagnosis not present

## 2023-12-07 DIAGNOSIS — M8580 Other specified disorders of bone density and structure, unspecified site: Secondary | ICD-10-CM | POA: Diagnosis not present

## 2023-12-07 DIAGNOSIS — E7800 Pure hypercholesterolemia, unspecified: Secondary | ICD-10-CM | POA: Diagnosis not present

## 2023-12-07 DIAGNOSIS — K219 Gastro-esophageal reflux disease without esophagitis: Secondary | ICD-10-CM | POA: Diagnosis not present

## 2023-12-07 MED ORDER — SIMVASTATIN 40 MG PO TABS: 40.0000 mg | ORAL_TABLET | Freq: Every day | ORAL | 2 refills | Status: DC

## 2023-12-07 MED ORDER — OMEPRAZOLE 20 MG PO CPDR: 20.0000 mg | DELAYED_RELEASE_CAPSULE | Freq: Every day | ORAL | 2 refills | Status: DC

## 2023-12-07 MED ORDER — TRAMADOL HCL 50 MG PO TABS: 50.0000 mg | ORAL_TABLET | Freq: Four times a day (QID) | ORAL | 2 refills | Status: DC | PRN

## 2023-12-07 NOTE — Patient Instructions (Signed)
 Osteopenia  Osteopenia is a loss of thickness (density) inside the bones. Another name for osteopenia is low bone mass. Mild osteopenia is a normal part of aging. It is not a disease, and it does not cause symptoms. However, if you have osteopenia and continue to lose bone mass, you could develop a condition that causes the bones to become thin and break more easily (osteoporosis). Osteoporosis can cause you to lose some height, have back pain, and have a stooped posture. Although osteopenia is not a disease, making changes to your lifestyle and diet can help to prevent osteopenia from developing into osteoporosis. What are the causes? Osteopenia is caused by loss of calcium in the bones. Bones are constantly changing. Old bone cells are continually being replaced with new bone cells. This process builds new bone. The mineral calcium is needed to build new bone and maintain bone density. Bone density is usually highest around age 24. After that, most people's bodies cannot replace all the bone they have lost with new bone. What increases the risk? You are more likely to develop this condition if: You are older than age 98. You are a woman who went through menopause early. You have a long illness that keeps you in bed. You do not get enough exercise. You lack certain nutrients (malnutrition). You have an overactive thyroid gland (hyperthyroidism). You use products that contain nicotine or tobacco, such as cigarettes, e-cigarettes and chewing tobacco, or you drink a lot of alcohol. You are taking medicines that weaken the bones, such as steroids. What are the signs or symptoms? This condition does not cause any symptoms. You may have a slightly higher risk for bone breaks (fractures), so getting fractures more easily than normal may be an indication of osteopenia. How is this diagnosed? This condition may be diagnosed based on an X-ray exam that measures bone density (dual-energy X-ray  absorptiometry, or DEXA). This test can measure bone density in your hips, spine, and wrists. Osteopenia has no symptoms, so this condition is usually diagnosed after a routine bone density screening test is done for osteoporosis. This routine screening is usually done for: Women who are age 11 or older. Men who are age 24 or older. If you have risk factors for osteopenia, you may have the screening test at an earlier age. How is this treated? Making dietary and lifestyle changes can lower your risk for osteoporosis. If you have severe osteopenia that is close to becoming osteoporosis, this condition can be treated with medicines and dietary supplements such as calcium and vitamin D. These supplements help to rebuild bone density. Follow these instructions at home: Eating and drinking Eat a diet that is high in calcium and vitamin D. Calcium is found in dairy products, beans, salmon, and leafy green vegetables like spinach and broccoli. Look for foods that have vitamin D and calcium added to them (fortified foods), such as orange juice, cereal, and bread.  Lifestyle Do 30 minutes or more of a weight-bearing exercise every day, such as walking, jogging, or playing a sport. These types of exercises strengthen the bones. Do not use any products that contain nicotine or tobacco, such as cigarettes, e-cigarettes, and chewing tobacco. If you need help quitting, ask your health care provider. Do not drink alcohol if: Your health care provider tells you not to drink. You are pregnant, may be pregnant, or are planning to become pregnant. If you drink alcohol: Limit how much you use to: 0-1 drink a day for women. 0-2  drinks a day for men. Be aware of how much alcohol is in your drink. In the [place] one drink equals one 12 oz bottle of beer (355 mL), one 5 oz glass of wine (148 mL), or one 1 oz glass of hard liquor (44 mL). General instructions Take over-the-counter and prescription medicines only as  told by your health care provider. These include vitamins and supplements. Take precautions at home to lower your risk of falling, such as: Keeping rooms well-lit and free of clutter, such as cords. Installing safety rails on stairs. Using rubber mats in the bathroom or other areas that are often wet or slippery. Keep all follow-up visits. This is important. Contact a health care provider if: You have not had a bone density screening for osteoporosis and you are: A woman who is age 109 or older. A man who is age 83 or older. You are a postmenopausal woman who has not had a bone density screening for osteoporosis. You are older than age 50 and you want to know if you should have bone density screening for osteoporosis. Summary Osteopenia is a loss of thickness (density) inside the bones. Another name for osteopenia is low bone mass. Osteopenia is not a disease, but it may increase your risk for a condition that causes the bones to become thin and break more easily (osteoporosis). You may be at risk for osteopenia if you are older than age 67 or if you are a woman who went through early menopause. Osteopenia does not cause any symptoms, but it can be diagnosed with a bone density screening test. Dietary and lifestyle changes are the first treatment for osteopenia. These may lower your risk for osteoporosis. This information is not intended to replace advice given to you by your health care provider. Make sure you discuss any questions you have with your health care provider. Document Revised: 05/01/2023 Document Reviewed: 05/01/2023 Elsevier Patient Education  2024 [employer].

## 2023-12-07 NOTE — Progress Notes (Signed)
 Subjective:    Patient ID: Kristi Barnes, female    DOB: 26-Jun-1951, 73 y.o.   MRN: [id number]  Chief Complaint  Patient presents with   Medical Management of Chronic Issues   PT presents to the office today for chronic follow up. She is followed by Hematologists as needed  iron deficiency anemia.     She has osteopenia and taking calcium and vit D. She does weight baring exercises everyday.   Complaining of hot flashes and irritability.  Hypertension This is a chronic problem. The current episode started more than 1 year ago. The problem has been resolved since onset. The problem is controlled. Associated symptoms include malaise/fatigue. Pertinent negatives include no peripheral edema or shortness of breath. Risk factors for coronary artery disease include dyslipidemia, obesity and sedentary lifestyle. The current treatment provides moderate improvement.  Gastroesophageal Reflux She complains of belching, heartburn and a hoarse voice. This is a chronic problem. The current episode started more than 1 year ago. The problem occurs occasionally. The symptoms are aggravated by certain foods. She has tried a PPI for the symptoms. The treatment provided moderate relief.  Arthritis Presents for follow-up visit. She complains of pain and stiffness. Affected locations include the left knee, right knee, left MCP and right MCP. Her pain is at a severity of 8/10.  Hyperlipidemia This is a chronic problem. The current episode started more than 1 year ago. The problem is controlled. Recent lipid tests were reviewed and are normal. Exacerbating diseases include obesity. Pertinent negatives include no shortness of breath. Current antihyperlipidemic treatment includes statins. The current treatment provides moderate improvement of lipids. Risk factors for coronary artery disease include dyslipidemia, hypertension, a sedentary lifestyle and post-menopausal.  Constipation This is a chronic problem. The  current episode started more than 1 year ago. The problem has been resolved since onset. Her stool frequency is 1 time per day. She has tried stool softeners for the symptoms. The treatment provided moderate relief.     Current opioids rx- Ultram 50 mg  # meds rx- 120  Effectiveness of current meds-stable Adverse reactions from pain meds-None Morphine equivalent- 8   Pill count performed-No Last drug screen - 07/09/23 ( high risk q67m, moderate risk q19m, low risk yearly ) Urine drug screen today- No Was the [facility] reviewed- Yes             If yes were their any concerning findings? - none   Pain contract signed on: 07/09/23  Review of Systems  Constitutional:  Positive for malaise/fatigue.  HENT:  Positive for hoarse voice.   Respiratory:  Negative for shortness of breath.   Gastrointestinal:  Positive for constipation and heartburn.  Musculoskeletal:  Positive for stiffness.  All other systems reviewed and are negative.  Family History  Problem Relation Age of Onset   Heart attack Mother    Cancer Father        lung   Leukemia Sister    Cancer Brother        back of neck   Cancer Sister        back of neck   Colon cancer Neg Hx    Social History   Socioeconomic History   Marital status: Divorced    Spouse name: Not on file   Number of children: 2   Years of education: 12   Highest education level: High school graduate  Occupational History   Occupation: retired  Tobacco Use   Smoking status: Never  Smokeless tobacco: Never  Vaping Use   Vaping status: Never Used  Substance and Sexual Activity   Alcohol use: No   Drug use: No   Sexual activity: Not Currently  Other Topics Concern   Not on file  Social History Narrative   Lives alone. Twin sister lives next door. Lots of family nearby   Social Drivers of Health   Financial Resource Strain: Low Risk  (01/24/2023)   Overall Financial Resource Strain (CARDIA)    Difficulty of Paying Living Expenses: Not  hard at all  Food Insecurity: No Food Insecurity (01/24/2023)   Hunger Vital Sign    Worried About Running Out of Food in the Last Year: Never true    Ran Out of Food in the Last Year: Never true  Transportation Needs: No Transportation Needs (01/24/2023)   PRAPARE - Administrator, Civil Service (Medical): No    Lack of Transportation (Non-Medical): No  Physical Activity: Sufficiently Active (01/24/2023)   Exercise Vital Sign    Days of Exercise per Week: 5 days    Minutes of Exercise per Session: 60 min  Stress: No Stress Concern Present (01/24/2023)   [employer] of Occupational Health - Occupational Stress Questionnaire    Feeling of Stress : Not at all  Social Connections: Moderately Isolated (01/24/2023)   Social Connection and Isolation Panel [NHANES]    Frequency of Communication with Friends and Family: More than three times a week    Frequency of Social Gatherings with Friends and Family: More than three times a week    Attends Religious Services: More than 4 times per year    Active Member of [employer] or Organizations: No    Attends Banker Meetings: Never    Marital Status: Divorced       Objective:   Physical Exam Vitals reviewed.  Constitutional:      General: She is not in acute distress.    Appearance: She is well-developed.  HENT:     Head: Normocephalic and atraumatic.     Right Ear: Tympanic membrane normal.     Left Ear: Tympanic membrane normal.  Eyes:     Pupils: Pupils are equal, round, and reactive to light.  Neck:     Thyroid: No thyromegaly.  Cardiovascular:     Rate and Rhythm: Normal rate and regular rhythm.     Heart sounds: Normal heart sounds. No murmur heard. Pulmonary:     Effort: Pulmonary effort is normal. No respiratory distress.     Breath sounds: Normal breath sounds. No wheezing.  Abdominal:     General: Bowel sounds are normal. There is no distension.     Palpations: Abdomen is soft.     Tenderness: There  is no abdominal tenderness.  Musculoskeletal:        General: No tenderness. Normal range of motion.     Cervical back: Normal range of motion and neck supple.     Comments: Full ROM of lumbar  Skin:    General: Skin is warm and dry.  Neurological:     Mental Status: She is alert and oriented to person, place, and time.     Cranial Nerves: No cranial nerve deficit.     Deep Tendon Reflexes: Reflexes are normal and symmetric.  Psychiatric:        Behavior: Behavior normal.        Thought Content: Thought content normal.        Judgment: Judgment normal.  BP 127/65   Pulse 66   Temp 97.9 F (36.6 C) (Temporal)   Ht 5\' 2"  (1.575 m)   Wt 160 lb (72.6 kg)   SpO2 98%   BMI 29.26 kg/m        Assessment & Plan:  [name] [name]   Diagnosis and orders addressed:  1. Gastroesophageal reflux disease without esophagitis (Primary) - omeprazole (PRILOSEC) 20 MG capsule; Take 1 capsule (20 mg total) by mouth daily.  Dispense: 90 capsule; Refill: 2  2. Pure hypercholesterolemia - simvastatin (ZOCOR) 40 MG tablet; Take 1 tablet (40 mg total) by mouth daily.  Dispense: 90 tablet; Refill: 2  3. Primary osteoarthritis of both knees - traMADol (ULTRAM) 50 MG tablet; Take 1-2 tablets (50-100 mg total) by mouth every 6 (six) hours as needed. for pain  Dispense: 120 tablet; Refill: 2  4. Vitamin D deficiency Continue Vit D   5. Deficiency anemia  6. Primary hypertension  7. Constipation, unspecified constipation type  8. Osteopenia, unspecified location  9. Controlled substance agreement signed - traMADol (ULTRAM) 50 MG tablet; Take 1-2 tablets (50-100 mg total) by mouth every 6 (six) hours as needed. for pain  Dispense: 120 tablet; Refill: 2  10. Chronic midline low back pain without sciatica - traMADol (ULTRAM) 50 MG tablet; Take 1-2 tablets (50-100 mg total) by mouth every 6 (six) hours as needed. for  pain  Dispense: 120 tablet; Refill: 2     Patient reviewed in [place] controlled database, no flags noted. Contract and drug screen are up to date.  Continue current medications  Health Maintenance reviewed Diet and exercise encouraged  Follow up plan: 3 months    Jannifer Rodney, FNP

## 2024-01-25 ENCOUNTER — Ambulatory Visit: Payer: Medicare Other

## 2024-01-25 VITALS — BP 127/65 | HR 66 | Ht 62.0 in | Wt 160.0 lb

## 2024-01-25 DIAGNOSIS — Z0000 Encounter for general adult medical examination without abnormal findings: Secondary | ICD-10-CM

## 2024-01-25 NOTE — Progress Notes (Signed)
 Subjective:   Kristi Barnes is a 73 y.o. who presents for a Medicare Wellness preventive visit.  As a reminder, Annual Wellness Visits don't include a physical exam, and some assessments may be limited, especially if this visit is performed virtually. We may recommend an in-person follow-up visit with your provider if needed.  Visit Complete: Virtual I connected with  Kristi Barnes on 01/25/24 by a audio enabled telemedicine application and verified that I am speaking with the correct person using two identifiers.  Patient Location: Home  Provider Location: Home Office  I discussed the limitations of evaluation and management by telemedicine. The patient expressed understanding and agreed to proceed.  Vital Signs: Because this visit was a virtual/telehealth visit, some criteria may be missing or patient reported. Any vitals not documented were not able to be obtained and vitals that have been documented are patient reported.  VideoDeclined- This patient declined Librarian, academic. Therefore the visit was completed with audio only.  Persons Participating in Visit: Patient.  AWV Questionnaire: No: Patient Medicare AWV questionnaire was not completed prior to this visit.  Cardiac Risk Factors include: advanced age (>65men, >25 women);hypertension     Objective:     Today's Vitals   01/25/24 0924 01/25/24 0925  BP: 127/65   Pulse: 66   Weight: 160 lb (72.6 kg)   Height: 5\' 2"  (1.575 m)   PainSc:  7    Body mass index is 29.26 kg/m.     01/25/2024    9:35 AM 03/30/2023   10:12 AM 01/24/2023   10:37 AM 09/22/2022   12:00 PM 07/10/2022    1:40 PM 06/19/2022    6:46 AM 06/12/2022    2:09 PM  Advanced Directives  Does Patient Have a Medical Advance Directive? No No No No No No No  Would patient like information on creating a medical advance directive?  No - Patient declined No - Patient declined No - Patient declined No - Patient declined No -  Patient declined No - Patient declined    Current Medications (verified) Outpatient Encounter Medications as of 01/25/2024  Medication Sig   amLODipine  (NORVASC ) 10 MG tablet TAKE 1 TABLET(10 MG) BY MOUTH DAILY   Calcium Carbonate-Vit D-Min (CALCIUM 1200 PO) Take by mouth.   chlorhexidine  (PERIDEX ) 0.12 % solution SMARTSIG:15 Milliliter(s) By Mouth   diclofenac  Sodium (VOLTAREN ) 1 % GEL APPLY TWO GRAMS TOPICALLY FOUR TIMES DAILY   escitalopram  (LEXAPRO ) 10 MG tablet Take 1 tablet (10 mg total) by mouth daily.   Ferrous Sulfate (IRON) 325 (65 Fe) MG TABS Take by mouth.   ibuprofen (ADVIL) 400 MG tablet Take by mouth.   latanoprost (XALATAN) 0.005 % ophthalmic solution 1 drop at bedtime.   omeprazole  (PRILOSEC) 20 MG capsule Take 1 capsule (20 mg total) by mouth daily.   promethazine (PHENERGAN) 25 MG tablet Take by mouth.   simvastatin  (ZOCOR ) 40 MG tablet Take 1 tablet (40 mg total) by mouth daily.   traMADol  (ULTRAM ) 50 MG tablet Take 1-2 tablets (50-100 mg total) by mouth every 6 (six) hours as needed. for pain   Vitamin D , Ergocalciferol , (DRISDOL ) 1.25 MG (50000 UNIT) CAPS capsule TAKE ONE CAPSULE BY MOUTH EVERY 7 DAYS.   vitamin E 180 MG (400 UNITS) capsule Take 400 Units by mouth daily.    No facility-administered encounter medications on file as of 01/25/2024.    Allergies (verified) Asa [aspirin]   History: Past Medical History:  Diagnosis Date   Arthritis  GERD (gastroesophageal reflux disease)    Glaucoma    Hyperlipidemia    Hypertension    Vitamin D  deficiency    Past Surgical History:  Procedure Laterality Date   bunions Bilateral    CATARACT EXTRACTION W/PHACO Right 06/19/2022   Procedure: CATARACT EXTRACTION PHACO AND INTRAOCULAR LENS PLACEMENT (IOC);  Surgeon: Tarri Farm, MD;  Location: [facility];  Service: Ophthalmology;  Laterality: Right;  CDE 8.00   CATARACT EXTRACTION W/PHACO Left 07/10/2022   Procedure: CATARACT EXTRACTION PHACO AND INTRAOCULAR LENS  PLACEMENT (IOC);  Surgeon: Tarri Farm, MD;  Location: [facility];  Service: Ophthalmology;  Laterality: Left;  CDE 7.07   COLONOSCOPY  01/2013   Dr. Susa Engman: Fentanyl  150 mcg/Versed  6 mg, sessile polyp removed from the cecum, tortuous rectosigmoid colon.  Pathology revealed focal hyperplastic change, lymphoid aggregate of second fragment with colonic tissue.  Advised to have another colonoscopy in 5 to 10 years.   COLONOSCOPY WITH PROPOFOL  N/A 08/26/2019   Dr. Nolene Baumgarten: INCOMPLETE DUE TO INADEQUATE BOWEL PREP. stool throughout the colon. hemorrhoids noted.   COLONOSCOPY WITH PROPOFOL  N/A 02/19/2020   Procedure: COLONOSCOPY WITH PROPOFOL ;  Surgeon: Suzette Espy, MD;  Location: [facility];  Service: Endoscopy;  Laterality: N/A;  2:30pm   Family History  Problem Relation Age of Onset   Heart attack Mother    Cancer Father        lung   Leukemia Sister    Cancer Brother        back of neck   Cancer Sister        back of neck   Colon cancer Neg Hx    Social History   Socioeconomic History   Marital status: Divorced    Spouse name: Not on file   Number of children: 2   Years of education: 12   Highest education level: High school graduate  Occupational History   Occupation: retired  Tobacco Use   Smoking status: Never   Smokeless tobacco: Never  Vaping Use   Vaping status: Never Used  Substance and Sexual Activity   Alcohol use: No   Drug use: No   Sexual activity: Not Currently  Other Topics Concern   Not on file  Social History Narrative   Lives alone. Twin sister lives next door. Lots of family nearby   Social Drivers of Health   Financial Resource Strain: Low Risk  (01/25/2024)   Overall Financial Resource Strain (CARDIA)    Difficulty of Paying Living Expenses: Not hard at all  Food Insecurity: No Food Insecurity (01/25/2024)   Hunger Vital Sign    Worried About Running Out of Food in the Last Year: Never true    Ran Out of Food in the Last Year: Never true   Transportation Needs: No Transportation Needs (01/25/2024)   PRAPARE - Administrator, Civil Service (Medical): No    Lack of Transportation (Non-Medical): No  Physical Activity: Sufficiently Active (01/25/2024)   Exercise Vital Sign    Days of Exercise per Week: 7 days    Minutes of Exercise per Session: 30 min  Stress: No Stress Concern Present (01/25/2024)   [employer] of Occupational Health - Occupational Stress Questionnaire    Feeling of Stress : Only a little  Social Connections: Moderately Isolated (01/25/2024)   Social Connection and Isolation Panel [NHANES]    Frequency of Communication with Friends and Family: More than three times a week    Frequency of Social Gatherings with Friends  and Family: More than three times a week    Attends Religious Services: More than 4 times per year    Active Member of Clubs or Organizations: No    Attends Banker Meetings: Never    Marital Status: Divorced    Tobacco Counseling Counseling given: Yes    Clinical Intake:  Pre-visit preparation completed: Yes  Pain : 0-10 (R-ankle/hands) Pain Score: 7  Pain Type: Chronic pain Pain Location: Hand (ankle/hands) Pain Orientation: Right Pain Descriptors / Indicators: Aching Pain Onset: Other (comment) (everyday per pt) Pain Frequency: Intermittent Pain Relieving Factors: tramadol  Effect of Pain on Daily Activities: none/just take her time  Pain Relieving Factors: tramadol   BMI - recorded: 29.26 Nutritional Status: BMI 25 -29 Overweight Nutritional Risks: None Diabetes: No  No results found for: "HGBA1C"   How often do you need to have someone help you when you read instructions, pamphlets, or other written materials from your doctor or pharmacy?: 3 - Sometimes (pt's son helps due to poor eye sight)  Interpreter Needed?: No  Information entered by :: Alia t/cma   Activities of Daily Living     01/25/2024    9:32 AM  In your present state of  health, do you have any difficulty performing the following activities:  Hearing? 0  Vision? 0  Difficulty concentrating or making decisions? 0  Walking or climbing stairs? 0  Dressing or bathing? 0  Doing errands, shopping? 1  Comment pt's sister  Preparing Food and eating ? N  Using the Toilet? N  In the past six months, have you accidently leaked urine? Y  Do you have problems with loss of bowel control? N  Managing your Medications? N  Managing your Finances? N  Housekeeping or managing your Housekeeping? N    Patient Care Team: Yevette Hem, FNP as PCP - General (Family Medicine) Alyce Jubilee, MD (Inactive) as Consulting Physician (Gastroenterology)  Indicate any recent Medical Services you may have received from other than [facility] providers in the past year (date may be approximate).     Assessment:    This is a routine wellness examination for [facility].  Hearing/Vision screen Hearing Screening - Comments:: Pt denies hearing dif Vision Screening - Comments:: Pt wear glasses/pt vision is update/pt goes to [facility] Nolon Baxter [facility]   Goals Addressed             This Visit's Progress    Patient Stated       traveling       Depression Screen     01/25/2024    9:38 AM 12/07/2023    1:31 PM 07/12/2023    1:53 PM 04/12/2023   10:42 AM 01/24/2023   10:35 AM 01/09/2023   11:35 AM 03/13/2022   12:59 PM  PHQ 2/9 Scores  PHQ - 2 Score 6 4 0 0 0 2 4  PHQ- 9 Score 23 9 0  0 11 4    Fall Risk     01/25/2024    9:30 AM 07/12/2023    1:55 PM 01/24/2023   10:33 AM 01/09/2023   11:35 AM 03/13/2022   12:59 PM  Fall Risk   Falls in the past year? 1 0 0 1 1  Number falls in past yr: 0 0 0 0 1  Injury with Fall? 1 0 0 1 0  Comment scratch knees      Risk for fall due to : Impaired balance/gait;Impaired mobility No Fall Risks No Fall Risks Impaired  balance/gait History of fall(s);Impaired balance/gait;Orthopedic patient  Follow up Falls evaluation completed;Education  provided;Falls prevention discussed Falls evaluation completed;Education provided Falls prevention discussed Falls evaluation completed Falls evaluation completed;Falls prevention discussed    MEDICARE RISK AT HOME:  Medicare Risk at Home Any stairs in or around the home?: No If so, are there any without handrails?: No Home free of loose throw rugs in walkways, pet beds, electrical cords, etc?: Yes Adequate lighting in your home to reduce risk of falls?: Yes Life alert?: No Use of a cane, walker or w/c?: No Grab bars in the bathroom?: Yes Shower chair or bench in shower?: No Elevated toilet seat or a handicapped toilet?: No  TIMED UP AND GO:  Was the test performed?  no  Cognitive Function: 6CIT completed        01/25/2024    9:41 AM 01/24/2023   10:37 AM 01/19/2022   10:39 AM 11/05/2019   10:22 AM  6CIT Screen  What Year? 0 points 0 points 0 points 0 points  What month? 0 points 0 points 0 points 0 points  What time? 0 points 0 points 0 points 0 points  Count back from 20 0 points 0 points 0 points 0 points  Months in reverse 4 points 0 points 4 points 4 points  Repeat phrase 0 points 0 points 4 points 2 points  Total Score 4 points 0 points 8 points 6 points    Immunizations Immunization History  Administered Date(s) Administered   Fluad Quad(high Dose 65+) 06/15/2022   Fluad Trivalent(High Dose 65+) 07/12/2023   Influenza,inj,Quad PF,6+ Mos 06/16/2021   Influenza-Unspecified 06/02/2020   PNEUMOCOCCAL CONJUGATE-20 09/16/2021   Pneumococcal Conjugate-13 08/10/2020   Td 08/18/1999   Tdap 12/13/2021   Zoster Recombinant(Shingrix ) 03/13/2022, 10/10/2022    Screening Tests Health Maintenance  Topic Date Due   MAMMOGRAM  03/07/2024   INFLUENZA VACCINE  03/28/2024   Medicare Annual Wellness (AWV)  01/24/2025   DEXA SCAN  04/12/2025   Colonoscopy  02/18/2030   DTaP/Tdap/Td (3 - Td or Tdap) 12/14/2031   Pneumonia Vaccine 45+ Years old  Completed   Hepatitis C  Screening  Completed   Zoster Vaccines- Shingrix   Completed   HPV VACCINES  Aged Out   Meningococcal B Vaccine  Aged Out   COVID-19 Vaccine  Discontinued    Health Maintenance  There are no preventive care reminders to display for this patient.  Health Maintenance Items Addressed: See Nurse Notes  Additional Screening:  Vision Screening: Recommended annual ophthalmology exams for early detection of glaucoma and other disorders of the eye.  Dental Screening: Recommended annual dental exams for proper oral hygiene  Community Resource Referral / Chronic Care Management: CRR required this visit?  No   CCM required this visit?  No   Plan:    I have personally reviewed and noted the following in the patient's chart:   Medical and social history Use of alcohol, tobacco or illicit drugs  Current medications and supplements including opioid prescriptions. Patient is not currently taking opioid prescriptions. Functional ability and status Nutritional status Physical activity Advanced directives List of other physicians Hospitalizations, surgeries, and ER visits in previous 12 months Vitals Screenings to include cognitive, depression, and falls Referrals and appointments  In addition, I have reviewed and discussed with patient certain preventive protocols, quality metrics, and best practice recommendations. A written personalized care plan for preventive services as well as general preventive health recommendations were provided to patient.   Michaelle Adolphus, CMA  01/25/2024   After Visit Summary: (Declined) Due to this being a telephonic visit, with patients personalized plan was offered to patient but patient Declined AVS at this time   Notes: Please refer to Routing Comments.

## 2024-01-25 NOTE — Patient Instructions (Signed)
 [name] , Thank you for taking time out of your busy schedule to complete your Annual Wellness Visit with me. I enjoyed our conversation and look forward to speaking with you again next year. I, as well as your care team,  appreciate your ongoing commitment to your health goals. Please review the following plan we discussed and let me know if I can assist you in the future. Your Game plan/ To Do List    Follow up Visits: Next Medicare AWV with our clinical staff: 01/27/25 at 8:40a.m.    Next Office Visit with your provider: 03/07/24 at 3:10p.m.  Clinician Recommendations:  Aim for 30 minutes of exercise or brisk walking, 6-8 glasses of water , and 5 servings of fruits and vegetables each day.       This is a list of the screening recommended for you and due dates:  Health Maintenance  Topic Date Due   Mammogram  03/07/2024   Flu Shot  03/28/2024   Medicare Annual Wellness Visit  01/24/2025   DEXA scan (bone density measurement)  04/12/2025   Colon Cancer Screening  02/18/2030   DTaP/Tdap/Td vaccine (3 - Td or Tdap) 12/14/2031   Pneumonia Vaccine  Completed   Hepatitis C Screening  Completed   Zoster (Shingles) Vaccine  Completed   HPV Vaccine  Aged Out   Meningitis B Vaccine  Aged Out   COVID-19 Vaccine  Discontinued    Advanced directives: (Declined) Advance directive discussed with you today. Even though you declined this today, please call our office should you change your mind, and we can give you the proper paperwork for you to fill out. Advance Care Planning is important because it:  [x]  Makes sure you receive the medical care that is consistent with your values, goals, and preferences  [x]  It provides guidance to your family and loved ones and reduces their decisional burden about whether or not they are making the right decisions based on your wishes.  Follow the link provided in your after visit summary or read over the paperwork we have mailed to you to help you started  getting your Advance Directives in place. If you need assistance in completing these, please reach out to us  so that we can help you!  See attachments for Preventive Care and Fall Prevention Tips.

## 2024-03-07 ENCOUNTER — Encounter: Payer: Self-pay | Admitting: Family

## 2024-03-07 ENCOUNTER — Ambulatory Visit: Admitting: Family

## 2024-03-07 ENCOUNTER — Ambulatory Visit (INDEPENDENT_AMBULATORY_CARE_PROVIDER_SITE_OTHER): Admitting: Family

## 2024-03-07 VITALS — BP 126/69 | HR 64 | Temp 98.6°F | Ht 62.0 in | Wt 165.0 lb

## 2024-03-07 DIAGNOSIS — Z79899 Other long term (current) drug therapy: Secondary | ICD-10-CM

## 2024-03-07 DIAGNOSIS — I10 Essential (primary) hypertension: Secondary | ICD-10-CM

## 2024-03-07 DIAGNOSIS — E559 Vitamin D deficiency, unspecified: Secondary | ICD-10-CM | POA: Diagnosis not present

## 2024-03-07 DIAGNOSIS — E7800 Pure hypercholesterolemia, unspecified: Secondary | ICD-10-CM | POA: Diagnosis not present

## 2024-03-07 DIAGNOSIS — M545 Low back pain, unspecified: Secondary | ICD-10-CM | POA: Diagnosis not present

## 2024-03-07 DIAGNOSIS — G8929 Other chronic pain: Secondary | ICD-10-CM | POA: Diagnosis not present

## 2024-03-07 DIAGNOSIS — D539 Nutritional anemia, unspecified: Secondary | ICD-10-CM

## 2024-03-07 DIAGNOSIS — K5900 Constipation, unspecified: Secondary | ICD-10-CM | POA: Diagnosis not present

## 2024-03-07 DIAGNOSIS — M170 Bilateral primary osteoarthritis of knee: Secondary | ICD-10-CM

## 2024-03-07 DIAGNOSIS — K219 Gastro-esophageal reflux disease without esophagitis: Secondary | ICD-10-CM

## 2024-03-07 DIAGNOSIS — R232 Flushing: Secondary | ICD-10-CM

## 2024-03-07 DIAGNOSIS — M8580 Other specified disorders of bone density and structure, unspecified site: Secondary | ICD-10-CM

## 2024-03-07 MED ORDER — TRAMADOL HCL 50 MG PO TABS: 50.0000 mg | ORAL_TABLET | Freq: Four times a day (QID) | ORAL | 2 refills | Status: DC | PRN

## 2024-03-07 NOTE — Patient Instructions (Signed)

## 2024-03-07 NOTE — Progress Notes (Signed)
 Subjective:    Patient ID: Kristi Barnes, female    DOB: Jul 23, 1951, 73 y.o.   MRN: [id number]  Chief Complaint  Patient presents with   Medical Management of Chronic Issues   PT presents to the office today for chronic follow up.   She is followed by Hematologists as needed  iron deficiency anemia.     She has osteopenia and taking calcium and vit D. She does weight baring exercises everyday.   Complaining of hot flashes and irritability.  Hypertension This is a chronic problem. The current episode started more than 1 year ago. The problem has been resolved since onset. The problem is controlled. Associated symptoms include malaise/fatigue. Pertinent negatives include no peripheral edema or shortness of breath. Risk factors for coronary artery disease include dyslipidemia, obesity and sedentary lifestyle. The current treatment provides moderate improvement.  Gastroesophageal Reflux She complains of belching, heartburn and a hoarse voice. This is a chronic problem. The current episode started more than 1 year ago. The problem occurs occasionally. The symptoms are aggravated by certain foods. Risk factors include obesity. She has tried a PPI for the symptoms. The treatment provided moderate relief.  Arthritis Presents for follow-up visit. She complains of pain and stiffness. Affected locations include the left knee, right knee, left MCP and right MCP. Her pain is at a severity of 8/10.  Hyperlipidemia This is a chronic problem. The current episode started more than 1 year ago. The problem is controlled. Recent lipid tests were reviewed and are normal. Exacerbating diseases include obesity. Pertinent negatives include no shortness of breath. Current antihyperlipidemic treatment includes statins. The current treatment provides moderate improvement of lipids. Risk factors for coronary artery disease include dyslipidemia, hypertension, a sedentary lifestyle and post-menopausal.   Constipation This is a chronic problem. The current episode started more than 1 year ago. The problem has been resolved since onset. Her stool frequency is 1 time per day. She has tried stool softeners for the symptoms. The treatment provided moderate relief.     Current opioids rx- Ultram  50 mg  # meds rx- 120  Effectiveness of current meds-stable Adverse reactions from pain meds-None Morphine equivalent- 8   Pill count performed-No Last drug screen - 07/09/23 ( high risk q37m, moderate risk q30m, low risk yearly ) Urine drug screen today- No Was the NCCSR reviewed- Yes             If yes were their any concerning findings? - none   Pain contract signed on: 07/09/23  Review of Systems  Constitutional:  Positive for malaise/fatigue.  HENT:  Positive for hoarse voice.   Respiratory:  Negative for shortness of breath.   Gastrointestinal:  Positive for constipation and heartburn.  Musculoskeletal:  Positive for stiffness.  All other systems reviewed and are negative.  Family History  Problem Relation Age of Onset   Heart attack Mother    Cancer Father        lung   Leukemia Sister    Cancer Brother        back of neck   Cancer Sister        back of neck   Colon cancer Neg Hx    Social History   Socioeconomic History   Marital status: Divorced    Spouse name: Not on file   Number of children: 2   Years of education: 12   Highest education level: High school graduate  Occupational History   Occupation: retired  Tobacco Use  Smoking status: Never   Smokeless tobacco: Never  Vaping Use   Vaping status: Never Used  Substance and Sexual Activity   Alcohol use: No   Drug use: No   Sexual activity: Not Currently  Other Topics Concern   Not on file  Social History Narrative   Lives alone. Twin sister lives next door. Lots of family nearby   Social Drivers of Health   Financial Resource Strain: Low Risk  (01/25/2024)   Overall Financial Resource Strain (CARDIA)     Difficulty of Paying Living Expenses: Not hard at all  Food Insecurity: No Food Insecurity (01/25/2024)   Hunger Vital Sign    Worried About Running Out of Food in the Last Year: Never true    Ran Out of Food in the Last Year: Never true  Transportation Needs: No Transportation Needs (01/25/2024)   PRAPARE - Administrator, Civil Service (Medical): No    Lack of Transportation (Non-Medical): No  Physical Activity: Sufficiently Active (01/25/2024)   Exercise Vital Sign    Days of Exercise per Week: 7 days    Minutes of Exercise per Session: 30 min  Stress: No Stress Concern Present (01/25/2024)   [employer] of Occupational Health - Occupational Stress Questionnaire    Feeling of Stress : Only a little  Social Connections: Moderately Isolated (01/25/2024)   Social Connection and Isolation Panel    Frequency of Communication with Friends and Family: More than three times a week    Frequency of Social Gatherings with Friends and Family: More than three times a week    Attends Religious Services: More than 4 times per year    Active Member of [employer] or Organizations: No    Attends Banker Meetings: Never    Marital Status: Divorced       Objective:   Physical Exam Vitals reviewed.  Constitutional:      General: She is not in acute distress.    Appearance: She is well-developed.  HENT:     Head: Normocephalic and atraumatic.     Right Ear: Tympanic membrane normal.     Left Ear: Tympanic membrane normal.  Eyes:     Pupils: Pupils are equal, round, and reactive to light.  Neck:     Thyroid : No thyromegaly.  Cardiovascular:     Rate and Rhythm: Normal rate and regular rhythm.     Heart sounds: Normal heart sounds. No murmur heard. Pulmonary:     Effort: Pulmonary effort is normal. No respiratory distress.     Breath sounds: Normal breath sounds. No wheezing.  Abdominal:     General: Bowel sounds are normal. There is no distension.     Palpations:  Abdomen is soft.     Tenderness: There is no abdominal tenderness.  Musculoskeletal:        General: No tenderness. Normal range of motion.     Cervical back: Normal range of motion and neck supple.     Comments: Full ROM of lumbar  Skin:    General: Skin is warm and dry.  Neurological:     Mental Status: She is alert and oriented to person, place, and time.     Cranial Nerves: No cranial nerve deficit.     Deep Tendon Reflexes: Reflexes are normal and symmetric.  Psychiatric:        Behavior: Behavior normal.        Thought Content: Thought content normal.        Judgment:  Judgment normal.     BP 126/69   Pulse 64   Temp 98.6 F (37 C)   Ht 5' 2 (1.575 m)   Wt 165 lb (74.8 kg)   SpO2 97%   BMI 30.18 kg/m        Assessment & Plan:  Kristi Barnes [name]   Diagnosis and orders addressed:  1. Gastroesophageal reflux disease without esophagitis - CMP14+EGFR - CBC with Differential/Platelet  2. Pure hypercholesterolemia - CMP14+EGFR - CBC with Differential/Platelet  3. Primary osteoarthritis of both knees (Primary)  - traMADol  (ULTRAM ) 50 MG tablet; Take 1-2 tablets (50-100 mg total) by mouth every 6 (six) hours as needed. for pain  Dispense: 120 tablet; Refill: 2 - CMP14+EGFR - CBC with Differential/Platelet  4. Vitamin D  deficiency - CMP14+EGFR - CBC with Differential/Platelet  5. Controlled substance agreement signed  - traMADol  (ULTRAM ) 50 MG tablet; Take 1-2 tablets (50-100 mg total) by mouth every 6 (six) hours as needed. for pain  Dispense: 120 tablet; Refill: 2 - CMP14+EGFR - CBC with Differential/Platelet  6. Osteopenia, unspecified location - CMP14+EGFR - CBC with Differential/Platelet  7. Primary hypertension - CMP14+EGFR - CBC with Differential/Platelet  8. Constipation, unspecified constipation type  - CMP14+EGFR - CBC with Differential/Platelet  9. Deficiency  anemia - CMP14+EGFR - CBC with Differential/Platelet  10. Chronic midline low back pain without sciatica - traMADol  (ULTRAM ) 50 MG tablet; Take 1-2 tablets (50-100 mg total) by mouth every 6 (six) hours as needed. for pain  Dispense: 120 tablet; Refill: 2 - CMP14+EGFR - CBC with Differential/Platelet  Patient reviewed in [place] controlled database, no flags noted. Contract and drug screen are up to date.  Continue current medications  Health Maintenance reviewed Diet and exercise encouraged  Follow up plan: 3 months    Bari Learn, FNP

## 2024-03-08 LAB — CBC WITH DIFFERENTIAL/PLATELET
Basophils Absolute: 0 x10E3/uL (ref 0.0–0.2)
Basos: 0 %
EOS (ABSOLUTE): 0 x10E3/uL (ref 0.0–0.4)
Eos: 0 %
Hematocrit: 35.2 % (ref 34.0–46.6)
Hemoglobin: 11.3 g/dL (ref 11.1–15.9)
Immature Grans (Abs): 0 x10E3/uL (ref 0.0–0.1)
Immature Granulocytes: 0 %
Lymphocytes Absolute: 2.3 x10E3/uL (ref 0.7–3.1)
Lymphs: 49 %
MCH: 30.5 pg (ref 26.6–33.0)
MCHC: 32.1 g/dL (ref 31.5–35.7)
MCV: 95 fL (ref 79–97)
Monocytes Absolute: 0.4 x10E3/uL (ref 0.1–0.9)
Monocytes: 8 %
Neutrophils Absolute: 2 x10E3/uL (ref 1.4–7.0)
Neutrophils: 43 %
Platelets: 250 x10E3/uL (ref 150–450)
RBC: 3.71 x10E6/uL — ABNORMAL LOW (ref 3.77–5.28)
RDW: 12.7 % (ref 11.7–15.4)
WBC: 4.7 x10E3/uL (ref 3.4–10.8)

## 2024-03-08 LAB — CMP14+EGFR
ALT: 14 IU/L (ref 0–32)
AST: 22 IU/L (ref 0–40)
Albumin: 4.4 g/dL (ref 3.8–4.8)
Alkaline Phosphatase: 66 IU/L (ref 44–121)
BUN/Creatinine Ratio: 14 (ref 12–28)
BUN: 15 mg/dL (ref 8–27)
Bilirubin Total: <0.2 mg/dL (ref 0.0–1.2)
CO2: 23 mmol/L (ref 20–29)
Calcium: 9.1 mg/dL (ref 8.7–10.3)
Chloride: 103 mmol/L (ref 96–106)
Creatinine, Ser: 1.07 mg/dL — ABNORMAL HIGH (ref 0.57–1.00)
Globulin, Total: 2.7 g/dL (ref 1.5–4.5)
Glucose: 75 mg/dL (ref 70–99)
Potassium: 4.3 mmol/L (ref 3.5–5.2)
Sodium: 139 mmol/L (ref 134–144)
Total Protein: 7.1 g/dL (ref 6.0–8.5)
eGFR: 56 mL/min/1.73 — ABNORMAL LOW (ref >59)

## 2024-03-10 ENCOUNTER — Ambulatory Visit: Payer: Self-pay | Admitting: Family

## 2024-03-25 DIAGNOSIS — Z1231 Encounter for screening mammogram for malignant neoplasm of breast: Secondary | ICD-10-CM | POA: Diagnosis not present

## 2024-04-30 ENCOUNTER — Encounter: Payer: Self-pay | Admitting: *Deleted

## 2024-05-15 DIAGNOSIS — H401121 Primary open-angle glaucoma, left eye, mild stage: Secondary | ICD-10-CM | POA: Diagnosis not present

## 2024-06-09 ENCOUNTER — Ambulatory Visit: Admitting: Family

## 2024-06-10 ENCOUNTER — Ambulatory Visit: Admitting: Family

## 2024-06-10 ENCOUNTER — Encounter: Payer: Self-pay | Admitting: Family

## 2024-06-10 VITALS — BP 122/73 | HR 62 | Temp 97.2°F | Ht 62.0 in | Wt 162.4 lb

## 2024-06-10 DIAGNOSIS — I10 Essential (primary) hypertension: Secondary | ICD-10-CM

## 2024-06-10 DIAGNOSIS — E7800 Pure hypercholesterolemia, unspecified: Secondary | ICD-10-CM | POA: Diagnosis not present

## 2024-06-10 DIAGNOSIS — M545 Low back pain, unspecified: Secondary | ICD-10-CM | POA: Diagnosis not present

## 2024-06-10 DIAGNOSIS — E559 Vitamin D deficiency, unspecified: Secondary | ICD-10-CM

## 2024-06-10 DIAGNOSIS — Z0000 Encounter for general adult medical examination without abnormal findings: Secondary | ICD-10-CM

## 2024-06-10 DIAGNOSIS — K219 Gastro-esophageal reflux disease without esophagitis: Secondary | ICD-10-CM | POA: Diagnosis not present

## 2024-06-10 DIAGNOSIS — D539 Nutritional anemia, unspecified: Secondary | ICD-10-CM

## 2024-06-10 DIAGNOSIS — Z0001 Encounter for general adult medical examination with abnormal findings: Secondary | ICD-10-CM

## 2024-06-10 DIAGNOSIS — Z79899 Other long term (current) drug therapy: Secondary | ICD-10-CM

## 2024-06-10 DIAGNOSIS — K5900 Constipation, unspecified: Secondary | ICD-10-CM

## 2024-06-10 DIAGNOSIS — Z23 Encounter for immunization: Secondary | ICD-10-CM

## 2024-06-10 DIAGNOSIS — M170 Bilateral primary osteoarthritis of knee: Secondary | ICD-10-CM

## 2024-06-10 DIAGNOSIS — M8580 Other specified disorders of bone density and structure, unspecified site: Secondary | ICD-10-CM

## 2024-06-10 DIAGNOSIS — R232 Flushing: Secondary | ICD-10-CM | POA: Diagnosis not present

## 2024-06-10 DIAGNOSIS — G8929 Other chronic pain: Secondary | ICD-10-CM

## 2024-06-10 LAB — LIPID PANEL

## 2024-06-10 MED ORDER — TRAMADOL HCL 50 MG PO TABS: 50.0000 mg | ORAL_TABLET | Freq: Four times a day (QID) | ORAL | 2 refills | Status: DC | PRN

## 2024-06-10 MED ORDER — ESCITALOPRAM OXALATE 10 MG PO TABS: 10.0000 mg | ORAL_TABLET | Freq: Every day | ORAL | 3 refills | Status: DC

## 2024-06-10 MED ORDER — DICLOFENAC SODIUM 1 % EX GEL
CUTANEOUS | 2 refills | Status: AC
Start: 2024-06-10 — End: ?

## 2024-06-10 MED ORDER — AMLODIPINE BESYLATE 10 MG PO TABS: 10.0000 mg | ORAL_TABLET | Freq: Every day | ORAL | 2 refills | Status: DC

## 2024-06-10 NOTE — Progress Notes (Signed)
 Subjective:    Patient ID: Kristi Barnes, female    DOB: 12-25-1950, 73 y.o.   MRN: [id number]  Chief Complaint  Patient presents with   Medical Management of Chronic Issues    JOINTS IN BOTH HANDS HURTING AND GETTING STUCK   PT presents to the office today for CPE.   She is followed by Hematologists as needed iron deficiency anemia.     She has osteopenia and taking calcium and vit D. She does weight baring exercises everyday.   Complaining of hot flashes and irritability.  Hypertension This is a chronic problem. The current episode started more than 1 year ago. The problem has been resolved since onset. The problem is controlled. Associated symptoms include malaise/fatigue. Pertinent negatives include no peripheral edema or shortness of breath. Risk factors for coronary artery disease include dyslipidemia, obesity and sedentary lifestyle. The current treatment provides moderate improvement.  Gastroesophageal Reflux She complains of belching, heartburn and a hoarse voice. This is a chronic problem. The current episode started more than 1 year ago. The problem occurs occasionally. The symptoms are aggravated by certain foods. Risk factors include obesity. She has tried a PPI for the symptoms. The treatment provided moderate relief.  Arthritis Presents for follow-up visit. She complains of pain and stiffness. Affected locations include the left knee, right knee, left MCP and right MCP. Her pain is at a severity of 7/10.  Hyperlipidemia This is a chronic problem. The current episode started more than 1 year ago. The problem is controlled. Recent lipid tests were reviewed and are normal. Exacerbating diseases include obesity. Pertinent negatives include no shortness of breath. Current antihyperlipidemic treatment includes statins. The current treatment provides moderate improvement of lipids. Risk factors for coronary artery disease include dyslipidemia, hypertension, a sedentary lifestyle and  post-menopausal.  Constipation This is a chronic problem. The current episode started more than 1 year ago. The problem has been resolved since onset. Her stool frequency is 1 time per day. She has tried stool softeners for the symptoms. The treatment provided moderate relief.     Current opioids rx- Ultram  50 mg  # meds rx- 120  Effectiveness of current meds-stable Adverse reactions from pain meds-None Morphine equivalent- 8   Pill count performed-No Last drug screen - 07/09/23 ( high risk q75m, moderate risk q28m, low risk yearly ) Urine drug screen today- No Was the NCCSR reviewed- Yes             If yes were their any concerning findings? - none   Pain contract signed on: 07/09/23  Review of Systems  Constitutional:  Positive for malaise/fatigue.  HENT:  Positive for hoarse voice.   Respiratory:  Negative for shortness of breath.   Gastrointestinal:  Positive for constipation and heartburn.  Musculoskeletal:  Positive for stiffness.  All other systems reviewed and are negative.  Family History  Problem Relation Age of Onset   Heart attack Mother    Cancer Father        lung   Leukemia Sister    Cancer Brother        back of neck   Cancer Sister        back of neck   Colon cancer Neg Hx    Social History   Socioeconomic History   Marital status: Divorced    Spouse name: Not on file   Number of children: 2   Years of education: 12   Highest education level: High school graduate  Occupational History  Occupation: retired  Tobacco Use   Smoking status: Never   Smokeless tobacco: Never  Vaping Use   Vaping status: Never Used  Substance and Sexual Activity   Alcohol use: No   Drug use: No   Sexual activity: Not Currently  Other Topics Concern   Not on file  Social History Narrative   Lives alone. Twin sister lives next door. Lots of family nearby   Social Drivers of Health   Financial Resource Strain: Low Risk  (01/25/2024)   Overall Financial Resource  Strain (CARDIA)    Difficulty of Paying Living Expenses: Not hard at all  Food Insecurity: No Food Insecurity (01/25/2024)   Hunger Vital Sign    Worried About Running Out of Food in the Last Year: Never true    Ran Out of Food in the Last Year: Never true  Transportation Needs: No Transportation Needs (01/25/2024)   PRAPARE - Administrator, Civil Service (Medical): No    Lack of Transportation (Non-Medical): No  Physical Activity: Sufficiently Active (01/25/2024)   Exercise Vital Sign    Days of Exercise per Week: 7 days    Minutes of Exercise per Session: 30 min  Stress: No Stress Concern Present (01/25/2024)   [employer] of Occupational Health - Occupational Stress Questionnaire    Feeling of Stress : Only a little  Social Connections: Moderately Isolated (01/25/2024)   Social Connection and Isolation Panel    Frequency of Communication with Friends and Family: More than three times a week    Frequency of Social Gatherings with Friends and Family: More than three times a week    Attends Religious Services: More than 4 times per year    Active Member of [employer] or Organizations: No    Attends Banker Meetings: Never    Marital Status: Divorced       Objective:   Physical Exam Vitals reviewed.  Constitutional:      General: She is not in acute distress.    Appearance: She is well-developed.  HENT:     Head: Normocephalic and atraumatic.     Right Ear: Tympanic membrane normal.     Left Ear: Tympanic membrane normal.  Eyes:     Pupils: Pupils are equal, round, and reactive to light.  Neck:     Thyroid : No thyromegaly.  Cardiovascular:     Rate and Rhythm: Normal rate and regular rhythm.     Heart sounds: Normal heart sounds. No murmur heard. Pulmonary:     Effort: Pulmonary effort is normal. No respiratory distress.     Breath sounds: Normal breath sounds. No wheezing.  Abdominal:     General: Bowel sounds are normal. There is no distension.      Palpations: Abdomen is soft.     Tenderness: There is no abdominal tenderness.  Musculoskeletal:        General: Tenderness present. Normal range of motion.     Cervical back: Normal range of motion and neck supple.     Comments: Full ROM of lumbar, pain with extension of right thumb and left middle finger  Skin:    General: Skin is warm and dry.  Neurological:     Mental Status: She is alert and oriented to person, place, and time.     Cranial Nerves: No cranial nerve deficit.     Deep Tendon Reflexes: Reflexes are normal and symmetric.  Psychiatric:        Behavior: Behavior normal.  Thought Content: Thought content normal.        Judgment: Judgment normal.     BP 122/73   Pulse 62   Temp (!) 97.2 F (36.2 C) (Temporal)   Ht 5' 2 (1.575 m)   Wt 162 lb 6.4 oz (73.7 kg)   SpO2 98%   BMI 29.70 kg/m        Assessment & Plan:  Ronal JONETTA Ates [name] (JOINTS IN BOTH HANDS HURTING AND GETTING STUCK)   Diagnosis and orders addressed:  1. Primary hypertension - amLODipine  (NORVASC ) 10 MG tablet; Take 1 tablet (10 mg total) by mouth daily.  Dispense: 90 tablet; Refill: 2 - CMP14+EGFR  2. Primary osteoarthritis of both knees - diclofenac  Sodium (VOLTAREN ) 1 % GEL; APPLY TWO GRAMS TOPICALLY FOUR TIMES DAILY  Dispense: 200 g; Refill: 2 - traMADol  (ULTRAM ) 50 MG tablet; Take 1-2 tablets (50-100 mg total) by mouth every 6 (six) hours as needed. for pain  Dispense: 120 tablet; Refill: 2 - CMP14+EGFR  3. Hot flashes - escitalopram  (LEXAPRO ) 10 MG tablet; Take 1 tablet (10 mg total) by mouth daily.  Dispense: 90 tablet; Refill: 3 - CMP14+EGFR  4. Controlled substance agreement signed - traMADol  (ULTRAM ) 50 MG tablet; Take 1-2 tablets (50-100 mg total) by mouth every 6 (six) hours as needed. for pain  Dispense: 120 tablet; Refill: 2 - CMP14+EGFR  5. Chronic midline low back pain without sciatica -  traMADol  (ULTRAM ) 50 MG tablet; Take 1-2 tablets (50-100 mg total) by mouth every 6 (six) hours as needed. for pain  Dispense: 120 tablet; Refill: 2 - CMP14+EGFR  6. Encounter for immunization  - Flu vaccine HIGH DOSE PF(Fluzone Trivalent)  7. Annual physical exam (Primary) - CMP14+EGFR - Anemia Profile B - Lipid panel  8. Constipation, unspecified constipation type - CMP14+EGFR  9. Deficiency anemia  - CMP14+EGFR - Anemia Profile B  10. Gastroesophageal reflux disease without esophagitis - CMP14+EGFR  11. Osteopenia, unspecified location  - CMP14+EGFR  12. Pure hypercholesterolemia - CMP14+EGFR  13. Vitamin D  deficiency - CMP14+EGFR - VITAMIN D  25 Hydroxy (Vit-D Deficiency, Fractures)   Patient reviewed in [place] controlled database, no flags noted. Contract and drug screen are up to date.  Continue current medications  Health Maintenance reviewed Diet and exercise encouraged  Follow up plan: 3 months    Bari Learn, FNP

## 2024-06-10 NOTE — Patient Instructions (Signed)
Trigger Finger  Trigger finger, also called stenosing tenosynovitis, is a condition that causes a finger or thumb to get stuck in a bent position. Each finger has tough, cord-like tissue (tendon) that connects muscle to bone, and each tendon passes through a tunnel of tissue (tendon sheath). The tendon sheaths are held close to the bone by a pulley. There is a pulley called the A1 pulley that is involved in the triggering of a finger or thumb. To move your finger, your tendon needs to glide freely through the sheath. Trigger finger happens when the tendon or the sheath thickens, making it difficult to bend or straighten your finger as the thickened tendon gets stuck in the A1 pulley. Trigger finger can affect any of the fingers or the thumbs. Mild cases may clear up with rest and medicine. Severe cases require more treatment. What are the causes? Trigger finger or thumb is caused by a thickened finger tendon or tendon sheath. The cause of this thickening is not known. What increases the risk? The following factors may make you more likely to develop this condition: Doing the same movements many times (repetitive activity) that require a strong grip. Having certain health conditions. These include rheumatoid arthritis, gout, carpal tunnel syndrome, or diabetes. Being 40-60 years old. Being female. What are the signs or symptoms? Symptoms of this condition include: Pain when bending or straightening your finger. Tenderness, swelling, or a lump in the palm of your hand just below the finger joint. Hearing a noise like a pop or a snap when you try to straighten your finger. Feeling a catch or locked feeling when you try to straighten your finger. Being unable to straighten your finger without help from your other hand. How is this diagnosed? This condition is diagnosed based on your symptoms and a physical exam. How is this treated? This condition may be treated by: Resting your finger and  avoiding activities that make symptoms worse. Wearing a finger splint to keep your finger extended. Taking NSAIDs, such as ibuprofen, to relieve pain and swelling around the tendon. Doing gentle exercises to stretch the finger as told by your health care provider. Having medicine that reduces swelling and inflammation (steroids) injected into the tendon sheath. Injections may need to be repeated. Trigger finger release. This surgery is done to open the pulley. This may be done if other treatments do not work and you cannot straighten or bend your finger. You may need hand therapy after surgery. Follow these instructions at home: If you have a removable splint: Wear the splint as told by your provider. Remove it only as told by your provider. Check the skin around the splint every day. Tell your provider about any concerns. Loosen the splint if your fingers tingle, become numb, or turn cold and blue. Keep the splint clean and dry. If the splint is not waterproof: Do not let it get wet. Cover it with a watertight covering when you take a bath or shower. Managing pain, stiffness, and swelling     If told, put ice on the painful area. If you have a removable splint, remove it as told by your health care provider. Put ice in a plastic bag. Place a towel between your skin and the bag or between your splint and the bag. Leave the ice on for 20 minutes, 2-3 times a day. If told, apply heat to the affected area as often as told by your provider. Use the heat source that your provider recommends, such as   a moist heat pack or a heating pad. Place a towel between your skin and the heat source. Leave the heat on for 20-30 minutes. If your skin turns bright red, remove the ice or heat right away to prevent skin damage. The risk of damage is higher if you cannot feel pain, heat, or cold. Activity Rest your finger as told by your provider. Avoid activities that make the pain worse. Return to your  normal activities as told by your provider. Ask your provider what activities are safe for you. Do exercises as told by your provider. Ask your provider when it is safe to drive if you have a splint on your hand. General instructions Take over-the-counter and prescription medicines only as told by your provider. Keep all follow-up visits. These are needed to see how you are progressing. Contact a health care provider if: Your symptoms are not improving with home care. This information is not intended to replace advice given to you by your health care provider. Make sure you discuss any questions you have with your health care provider. Document Revised: 03/31/2022 Document Reviewed: 03/31/2022 Elsevier Patient Education  2024 Elsevier Inc.  

## 2024-06-11 LAB — ANEMIA PROFILE B
Basophils Absolute: 0 x10E3/uL (ref 0.0–0.2)
Basos: 0 %
EOS (ABSOLUTE): 0 x10E3/uL (ref 0.0–0.4)
Eos: 0 %
Ferritin: 404 ng/mL — AB (ref 15–150)
Folate: 12.2 ng/mL (ref >3.0)
Hematocrit: 35.6 % (ref 34.0–46.6)
Hemoglobin: 11.4 g/dL (ref 11.1–15.9)
Immature Grans (Abs): 0 x10E3/uL (ref 0.0–0.1)
Immature Granulocytes: 0 %
Iron Saturation: 26 % (ref 15–55)
Iron: 74 ug/dL (ref 27–139)
Lymphocytes Absolute: 2 x10E3/uL (ref 0.7–3.1)
Lymphs: 44 %
MCH: 30.6 pg (ref 26.6–33.0)
MCHC: 32 g/dL (ref 31.5–35.7)
MCV: 95 fL (ref 79–97)
Monocytes Absolute: 0.4 x10E3/uL (ref 0.1–0.9)
Monocytes: 8 %
Neutrophils Absolute: 2.1 x10E3/uL (ref 1.4–7.0)
Neutrophils: 48 %
Platelets: 255 x10E3/uL (ref 150–450)
RBC: 3.73 x10E6/uL — ABNORMAL LOW (ref 3.77–5.28)
RDW: 12.4 % (ref 11.7–15.4)
Retic Ct Pct: 2.4 % (ref 0.6–2.6)
Total Iron Binding Capacity: 286 ug/dL (ref 250–450)
UIBC: 212 ug/dL (ref 118–369)
Vitamin B-12: 724 pg/mL (ref 232–1245)
WBC: 4.5 x10E3/uL (ref 3.4–10.8)

## 2024-06-11 LAB — CMP14+EGFR
ALT: 16 IU/L (ref 0–32)
AST: 21 IU/L (ref 0–40)
Albumin: 4.7 g/dL (ref 3.8–4.8)
Alkaline Phosphatase: 69 IU/L (ref 49–135)
BUN/Creatinine Ratio: 13 (ref 12–28)
BUN: 14 mg/dL (ref 8–27)
Bilirubin Total: 0.3 mg/dL (ref 0.0–1.2)
CO2: 24 mmol/L (ref 20–29)
Calcium: 9.5 mg/dL (ref 8.7–10.3)
Chloride: 101 mmol/L (ref 96–106)
Creatinine, Ser: 1.09 mg/dL — AB (ref 0.57–1.00)
Globulin, Total: 2.6 g/dL (ref 1.5–4.5)
Glucose: 73 mg/dL (ref 70–99)
Potassium: 4.7 mmol/L (ref 3.5–5.2)
Sodium: 138 mmol/L (ref 134–144)
Total Protein: 7.3 g/dL (ref 6.0–8.5)
eGFR: 54 mL/min/1.73 — AB (ref >59)

## 2024-06-11 LAB — LIPID PANEL
Cholesterol, Total: 169 mg/dL (ref 100–199)
HDL: 58 mg/dL (ref >39)
LDL CALC COMMENT:: 2.9 ratio (ref 0.0–4.4)
LDL Chol Calc (NIH): 98 mg/dL (ref 0–99)
Triglycerides: 70 mg/dL (ref 0–149)
VLDL Cholesterol Cal: 13 mg/dL (ref 5–40)

## 2024-06-11 LAB — VITAMIN D 25 HYDROXY (VIT D DEFICIENCY, FRACTURES): Vit D, 25-Hydroxy: 42.3 ng/mL (ref 30.0–100.0)

## 2024-06-12 ENCOUNTER — Ambulatory Visit: Payer: Self-pay | Admitting: Family

## 2024-07-11 ENCOUNTER — Other Ambulatory Visit: Payer: Self-pay | Admitting: *Deleted

## 2024-07-11 DIAGNOSIS — I10 Essential (primary) hypertension: Secondary | ICD-10-CM

## 2024-08-25 ENCOUNTER — Other Ambulatory Visit: Payer: Self-pay | Admitting: Family

## 2024-08-25 DIAGNOSIS — K219 Gastro-esophageal reflux disease without esophagitis: Secondary | ICD-10-CM

## 2024-08-25 DIAGNOSIS — E7800 Pure hypercholesterolemia, unspecified: Secondary | ICD-10-CM

## 2024-09-11 ENCOUNTER — Encounter: Payer: Self-pay | Admitting: Family

## 2024-09-11 ENCOUNTER — Ambulatory Visit (INDEPENDENT_AMBULATORY_CARE_PROVIDER_SITE_OTHER): Payer: Self-pay | Admitting: Family

## 2024-09-11 VITALS — BP 130/63 | HR 94 | Temp 97.7°F | Ht 62.0 in | Wt 165.2 lb

## 2024-09-11 DIAGNOSIS — K5900 Constipation, unspecified: Secondary | ICD-10-CM

## 2024-09-11 DIAGNOSIS — Z79899 Other long term (current) drug therapy: Secondary | ICD-10-CM

## 2024-09-11 DIAGNOSIS — D539 Nutritional anemia, unspecified: Secondary | ICD-10-CM

## 2024-09-11 DIAGNOSIS — K219 Gastro-esophageal reflux disease without esophagitis: Secondary | ICD-10-CM

## 2024-09-11 DIAGNOSIS — G8929 Other chronic pain: Secondary | ICD-10-CM | POA: Diagnosis not present

## 2024-09-11 DIAGNOSIS — M545 Low back pain, unspecified: Secondary | ICD-10-CM | POA: Diagnosis not present

## 2024-09-11 DIAGNOSIS — I10 Essential (primary) hypertension: Secondary | ICD-10-CM | POA: Diagnosis not present

## 2024-09-11 DIAGNOSIS — E7800 Pure hypercholesterolemia, unspecified: Secondary | ICD-10-CM | POA: Diagnosis not present

## 2024-09-11 DIAGNOSIS — M5432 Sciatica, left side: Secondary | ICD-10-CM | POA: Diagnosis not present

## 2024-09-11 DIAGNOSIS — M8580 Other specified disorders of bone density and structure, unspecified site: Secondary | ICD-10-CM

## 2024-09-11 DIAGNOSIS — M170 Bilateral primary osteoarthritis of knee: Secondary | ICD-10-CM

## 2024-09-11 MED ORDER — TRAMADOL HCL 50 MG PO TABS: 50.0000 mg | ORAL_TABLET | Freq: Four times a day (QID) | ORAL | 2 refills | Status: AC | PRN

## 2024-09-11 MED ORDER — AMLODIPINE BESYLATE 10 MG PO TABS: 10.0000 mg | ORAL_TABLET | Freq: Every day | ORAL | 2 refills | Status: AC

## 2024-09-11 MED ORDER — BACLOFEN 10 MG PO TABS: 5.0000 mg | ORAL_TABLET | Freq: Two times a day (BID) | ORAL | 0 refills | Status: AC | PRN

## 2024-09-11 NOTE — Patient Instructions (Addendum)

## 2024-09-11 NOTE — Progress Notes (Signed)
 "  Subjective:    Patient ID: Kristi Barnes, female    DOB: 17-Aug-1951, 74 y.o.   MRN: [id number]  Chief Complaint  Patient presents with   Medical Management of Chronic Issues    Leg numb in left leg goes all the down. 2 weeks ago    PT presents to the office today for chronic follow up.   She is followed by Hematologists as needed iron deficiency anemia.     She has osteopenia and taking calcium and vit D. She does weight baring exercises everyday.   Complaining of hot flashes and irritability.  Hypertension This is a chronic problem. The current episode started more than 1 year ago. The problem has been resolved since onset. The problem is controlled. Associated symptoms include malaise/fatigue. Pertinent negatives include no peripheral edema or shortness of breath. Risk factors for coronary artery disease include dyslipidemia, obesity, sedentary lifestyle and post-menopausal state. The current treatment provides moderate improvement.  Gastroesophageal Reflux She complains of belching, heartburn and a hoarse voice. This is a chronic problem. The current episode started more than 1 year ago. The problem occurs occasionally. The symptoms are aggravated by certain foods. Risk factors include obesity. She has tried a PPI for the symptoms. The treatment provided moderate relief.  Arthritis Presents for follow-up visit. She complains of pain and stiffness. Affected locations include the left knee, right knee, left MCP and right MCP. Her pain is at a severity of 6/10.  Hyperlipidemia This is a chronic problem. The current episode started more than 1 year ago. The problem is controlled. Recent lipid tests were reviewed and are normal. Exacerbating diseases include obesity. Associated symptoms include leg pain. Pertinent negatives include no shortness of breath. Current antihyperlipidemic treatment includes statins. The current treatment provides moderate improvement of lipids. Risk factors for  coronary artery disease include dyslipidemia, hypertension, a sedentary lifestyle and post-menopausal.  Constipation This is a chronic problem. The current episode started more than 1 year ago. The problem has been resolved since onset. Her stool frequency is 1 time per day. Associated symptoms include back pain. She has tried stool softeners for the symptoms. The treatment provided moderate relief.  Back Pain This is a new problem. The current episode started 1 to 4 weeks ago. The problem occurs intermittently. The problem has been waxing and waning since onset. The pain is present in the lumbar spine. The quality of the pain is described as aching. The pain radiates to the left thigh and left foot. The pain is at a severity of 4/10. The pain is moderate. The symptoms are aggravated by bending and standing. Associated symptoms include leg pain. She has tried bed rest for the symptoms. The treatment provided mild relief.     Current opioids rx- Ultram  50 mg  # meds rx- 120  Effectiveness of current meds-stable Adverse reactions from pain meds-None Morphine equivalent- 8   Pill count performed-No Last drug screen - 07/09/23 ( high risk q97m, moderate risk q66m, low risk yearly ) Urine drug screen today- No Was the NCCSR reviewed- Yes             If yes were their any concerning findings? - none   Pain contract signed on: 07/09/23  Review of Systems  Constitutional:  Positive for malaise/fatigue.  HENT:  Positive for hoarse voice.   Respiratory:  Negative for shortness of breath.   Gastrointestinal:  Positive for constipation and heartburn.  Musculoskeletal:  Positive for back pain and stiffness.  All other systems reviewed and are negative.  Family History  Problem Relation Age of Onset   Heart attack Mother    Cancer Father        lung   Leukemia Sister    Cancer Brother        back of neck   Cancer Sister        back of neck   Colon cancer Neg Hx    Social History    Socioeconomic History   Marital status: Divorced    Spouse name: Not on file   Number of children: 2   Years of education: 12   Highest education level: High school graduate  Occupational History   Occupation: retired  Tobacco Use   Smoking status: Never   Smokeless tobacco: Never  Vaping Use   Vaping status: Never Used  Substance and Sexual Activity   Alcohol use: No   Drug use: No   Sexual activity: Not Currently  Other Topics Concern   Not on file  Social History Narrative   Lives alone. Twin sister lives next door. Lots of family nearby   Social Drivers of Health   Tobacco Use: Low Risk (09/11/2024)   Patient History    Smoking Tobacco Use: Never    Smokeless Tobacco Use: Never    Passive Exposure: Not on file  Financial Resource Strain: Low Risk (01/25/2024)   Overall Financial Resource Strain (CARDIA)    Difficulty of Paying Living Expenses: Not hard at all  Food Insecurity: No Food Insecurity (01/25/2024)   Hunger Vital Sign    Worried About Running Out of Food in the Last Year: Never true    Ran Out of Food in the Last Year: Never true  Transportation Needs: No Transportation Needs (01/25/2024)   PRAPARE - Administrator, Civil Service (Medical): No    Lack of Transportation (Non-Medical): No  Physical Activity: Sufficiently Active (01/25/2024)   Exercise Vital Sign    Days of Exercise per Week: 7 days    Minutes of Exercise per Session: 30 min  Stress: No Stress Concern Present (01/25/2024)   [employer] of Occupational Health - Occupational Stress Questionnaire    Feeling of Stress : Only a little  Social Connections: Moderately Isolated (01/25/2024)   Social Connection and Isolation Panel    Frequency of Communication with Friends and Family: More than three times a week    Frequency of Social Gatherings with Friends and Family: More than three times a week    Attends Religious Services: More than 4 times per year    Active Member of Clubs  or Organizations: No    Attends Banker Meetings: Never    Marital Status: Divorced  Depression (PHQ2-9): Medium Risk (09/11/2024)   Depression (PHQ2-9)    PHQ-2 Score: 6  Alcohol Screen: Low Risk (01/24/2023)   Alcohol Screen    Last Alcohol Screening Score (AUDIT): 0  Housing: Unknown (01/25/2024)   Housing Stability Vital Sign    Unable to Pay for Housing in the Last Year: No    Number of Times Moved in the Last Year: Not on file    Homeless in the Last Year: No  Utilities: Not At Risk (01/25/2024)   [facility] Utilities    Threatened with loss of utilities: No  Health Literacy: Inadequate Health Literacy (01/25/2024)   B1300 Health Literacy    Frequency of need for help with medical instructions: Sometimes       Objective:  Physical Exam Vitals reviewed.  Constitutional:      General: She is not in acute distress.    Appearance: She is well-developed.  HENT:     Head: Normocephalic and atraumatic.     Right Ear: Tympanic membrane normal.     Left Ear: Tympanic membrane normal.  Eyes:     Pupils: Pupils are equal, round, and reactive to light.  Neck:     Thyroid : No thyromegaly.  Cardiovascular:     Rate and Rhythm: Normal rate and regular rhythm.     Heart sounds: Normal heart sounds. No murmur heard. Pulmonary:     Effort: Pulmonary effort is normal. No respiratory distress.     Breath sounds: Normal breath sounds. No wheezing.  Abdominal:     General: Bowel sounds are normal. There is no distension.     Palpations: Abdomen is soft.     Tenderness: There is no abdominal tenderness.  Musculoskeletal:        General: Tenderness present. Normal range of motion.     Cervical back: Normal range of motion and neck supple.     Comments: Full ROM of lumbar, pain with extension of right thumb and left middle finger  Skin:    General: Skin is warm and dry.  Neurological:     Mental Status: She is alert and oriented to person, place, and time.     Cranial Nerves: No  cranial nerve deficit.     Deep Tendon Reflexes: Reflexes are normal and symmetric.  Psychiatric:        Behavior: Behavior normal.        Thought Content: Thought content normal.        Judgment: Judgment normal.     BP 130/63   Pulse 94   Temp 97.7 F (36.5 C) (Temporal)   Ht 5' 2 (1.575 m)   Wt 165 lb 3.2 oz (74.9 kg)   SpO2 98%   BMI 30.22 kg/m        Assessment & Plan:  Kristi Barnes [name] (Leg numb in left leg goes all the down. 2 weeks ago )   Diagnosis and orders addressed:  1. Primary hypertension - amLODipine  (NORVASC ) 10 MG tablet; Take 1 tablet (10 mg total) by mouth daily.  Dispense: 90 tablet; Refill: 2  2. Primary osteoarthritis of both knees (Primary) - traMADol  (ULTRAM ) 50 MG tablet; Take 1-2 tablets (50-100 mg total) by mouth every 6 (six) hours as needed. for pain  Dispense: 120 tablet; Refill: 2 - ToxASSURE Select 13 (MW), Urine  3. Controlled substance agreement signed - traMADol  (ULTRAM ) 50 MG tablet; Take 1-2 tablets (50-100 mg total) by mouth every 6 (six) hours as needed. for pain  Dispense: 120 tablet; Refill: 2 - ToxASSURE Select 13 (MW), Urine  4. Chronic midline low back pain without sciatica - traMADol  (ULTRAM ) 50 MG tablet; Take 1-2 tablets (50-100 mg total) by mouth every 6 (six) hours as needed. for pain  Dispense: 120 tablet; Refill: 2 - ToxASSURE Select 13 (MW), Urine  5. Constipation, unspecified constipation type Force fluids  6. Gastroesophageal reflux disease without esophagitis -Diet discussed- Avoid fried, spicy, citrus foods, caffeine and alcohol -Do not eat 2-3 hours before bedtime -Encouraged small frequent meals -Avoid NSAID's  7. Deficiency anemia Iron rich diet  8. Osteopenia, unspecified location Weight bearing exercises   9. Pure hypercholesterolemia Low fat diet   10. Sciatica of left side  ROM exercises- handout given  Baclofen  as  needed Ultram  as needed  - baclofen  (LIORESAL ) 10 MG tablet; Take 0.5 tablets (5 mg total) by mouth 2 (two) times daily as needed for muscle spasms.  Dispense: 60 each; Refill: 0   Patient reviewed in [place] controlled database, no flags noted. Contract and drug screen are up to date.  Continue current medications  Health Maintenance reviewed Diet and exercise encouraged  Follow up plan: 3 months    Bari Learn, FNP   "

## 2024-09-19 ENCOUNTER — Ambulatory Visit: Payer: Self-pay | Admitting: Family

## 2024-09-19 LAB — TOXASSURE SELECT 13 (MW), URINE

## 2024-09-29 ENCOUNTER — Other Ambulatory Visit: Payer: Self-pay | Admitting: Family

## 2024-09-29 DIAGNOSIS — R232 Flushing: Secondary | ICD-10-CM

## 2024-12-11 ENCOUNTER — Ambulatory Visit: Admitting: Family
